# Patient Record
Sex: Female | Born: 1950 | Race: White | Hispanic: No | Marital: Married | Smoking: Never smoker
Health system: Southern US, Community
[De-identification: ages and names within clinical notes are randomized; demographics above are authoritative.]

## PROBLEM LIST (undated history)

## (undated) DIAGNOSIS — I10 Essential (primary) hypertension: Secondary | ICD-10-CM

## (undated) DIAGNOSIS — C4490 Unspecified malignant neoplasm of skin, unspecified: Secondary | ICD-10-CM

## (undated) DIAGNOSIS — J189 Pneumonia, unspecified organism: Secondary | ICD-10-CM

## (undated) DIAGNOSIS — I2510 Atherosclerotic heart disease of native coronary artery without angina pectoris: Secondary | ICD-10-CM

## (undated) DIAGNOSIS — S72009A Fracture of unspecified part of neck of unspecified femur, initial encounter for closed fracture: Secondary | ICD-10-CM

## (undated) DIAGNOSIS — B009 Herpesviral infection, unspecified: Secondary | ICD-10-CM

## (undated) DIAGNOSIS — M8580 Other specified disorders of bone density and structure, unspecified site: Secondary | ICD-10-CM

## (undated) HISTORY — PX: NASAL SEPTUM SURGERY: SHX37

## (undated) HISTORY — PX: CALDWELL LUC: SHX1284

## (undated) HISTORY — DX: Other specified disorders of bone density and structure, unspecified site: M85.80

## (undated) HISTORY — PX: TONSILLECTOMY: SUR1361

## (undated) HISTORY — PX: HIP SURGERY: SHX245

## (undated) HISTORY — DX: Herpesviral infection, unspecified: B00.9

## (undated) HISTORY — DX: Atherosclerotic heart disease of native coronary artery without angina pectoris: I25.10

## (undated) HISTORY — PX: MOHS SURGERY: SUR867

## (undated) HISTORY — DX: Unspecified malignant neoplasm of skin, unspecified: C44.90

---

## 1998-05-02 ENCOUNTER — Ambulatory Visit (HOSPITAL_COMMUNITY): Admission: RE | Admit: 1998-05-02 | Discharge: 1998-05-02 | Payer: Self-pay | Admitting: Obstetrics and Gynecology

## 1998-05-04 ENCOUNTER — Ambulatory Visit (HOSPITAL_COMMUNITY): Admission: RE | Admit: 1998-05-04 | Discharge: 1998-05-04 | Payer: Self-pay | Admitting: Obstetrics and Gynecology

## 1999-07-16 ENCOUNTER — Ambulatory Visit (HOSPITAL_COMMUNITY): Admission: RE | Admit: 1999-07-16 | Discharge: 1999-07-16 | Payer: Self-pay | Admitting: Obstetrics and Gynecology

## 1999-07-16 ENCOUNTER — Encounter: Payer: Self-pay | Admitting: Obstetrics and Gynecology

## 2000-05-04 ENCOUNTER — Other Ambulatory Visit: Admission: RE | Admit: 2000-05-04 | Discharge: 2000-05-04 | Payer: Self-pay | Admitting: Obstetrics and Gynecology

## 2000-07-31 ENCOUNTER — Ambulatory Visit (HOSPITAL_COMMUNITY): Admission: RE | Admit: 2000-07-31 | Discharge: 2000-07-31 | Payer: Self-pay | Admitting: Obstetrics and Gynecology

## 2000-07-31 ENCOUNTER — Encounter: Payer: Self-pay | Admitting: Obstetrics and Gynecology

## 2001-06-21 ENCOUNTER — Other Ambulatory Visit: Admission: RE | Admit: 2001-06-21 | Discharge: 2001-06-21 | Payer: Self-pay | Admitting: Obstetrics and Gynecology

## 2001-08-05 ENCOUNTER — Encounter: Payer: Self-pay | Admitting: Internal Medicine

## 2001-08-05 ENCOUNTER — Encounter: Admission: RE | Admit: 2001-08-05 | Discharge: 2001-08-05 | Payer: Self-pay | Admitting: Internal Medicine

## 2001-08-17 ENCOUNTER — Ambulatory Visit (HOSPITAL_COMMUNITY): Admission: RE | Admit: 2001-08-17 | Discharge: 2001-08-17 | Payer: Self-pay | Admitting: Obstetrics and Gynecology

## 2001-08-17 ENCOUNTER — Encounter: Payer: Self-pay | Admitting: Obstetrics and Gynecology

## 2002-02-09 ENCOUNTER — Encounter: Admission: RE | Admit: 2002-02-09 | Discharge: 2002-02-09 | Payer: Self-pay | Admitting: Family Medicine

## 2002-02-10 ENCOUNTER — Encounter: Admission: RE | Admit: 2002-02-10 | Discharge: 2002-02-10 | Payer: Self-pay | Admitting: Sports Medicine

## 2002-07-25 ENCOUNTER — Other Ambulatory Visit: Admission: RE | Admit: 2002-07-25 | Discharge: 2002-07-25 | Payer: Self-pay | Admitting: Obstetrics and Gynecology

## 2002-08-11 ENCOUNTER — Encounter: Admission: RE | Admit: 2002-08-11 | Discharge: 2002-08-11 | Payer: Self-pay | Admitting: Obstetrics and Gynecology

## 2002-08-11 ENCOUNTER — Encounter: Payer: Self-pay | Admitting: Obstetrics and Gynecology

## 2002-11-10 ENCOUNTER — Ambulatory Visit (HOSPITAL_COMMUNITY): Admission: RE | Admit: 2002-11-10 | Discharge: 2002-11-10 | Payer: Self-pay | Admitting: Obstetrics and Gynecology

## 2002-11-10 ENCOUNTER — Encounter: Payer: Self-pay | Admitting: Obstetrics and Gynecology

## 2003-08-30 ENCOUNTER — Other Ambulatory Visit: Admission: RE | Admit: 2003-08-30 | Discharge: 2003-08-30 | Payer: Self-pay | Admitting: Obstetrics and Gynecology

## 2003-09-20 ENCOUNTER — Encounter: Payer: Self-pay | Admitting: Internal Medicine

## 2003-09-20 ENCOUNTER — Encounter: Admission: RE | Admit: 2003-09-20 | Discharge: 2003-09-20 | Payer: Self-pay | Admitting: Internal Medicine

## 2003-11-13 ENCOUNTER — Encounter: Admission: RE | Admit: 2003-11-13 | Discharge: 2003-11-13 | Payer: Self-pay | Admitting: Obstetrics and Gynecology

## 2003-12-28 ENCOUNTER — Encounter: Admission: RE | Admit: 2003-12-28 | Discharge: 2004-01-29 | Payer: Self-pay | Admitting: Sports Medicine

## 2004-10-18 ENCOUNTER — Ambulatory Visit (HOSPITAL_COMMUNITY): Admission: RE | Admit: 2004-10-18 | Discharge: 2004-10-18 | Payer: Self-pay | Admitting: Gastroenterology

## 2004-11-29 ENCOUNTER — Encounter: Admission: RE | Admit: 2004-11-29 | Discharge: 2004-11-29 | Payer: Self-pay | Admitting: Obstetrics and Gynecology

## 2005-08-27 ENCOUNTER — Other Ambulatory Visit: Admission: RE | Admit: 2005-08-27 | Discharge: 2005-08-27 | Payer: Self-pay | Admitting: Obstetrics and Gynecology

## 2005-09-16 ENCOUNTER — Encounter: Admission: RE | Admit: 2005-09-16 | Discharge: 2005-09-16 | Payer: Self-pay | Admitting: Obstetrics and Gynecology

## 2006-01-01 ENCOUNTER — Encounter: Admission: RE | Admit: 2006-01-01 | Discharge: 2006-01-01 | Payer: Self-pay | Admitting: Obstetrics and Gynecology

## 2006-10-13 ENCOUNTER — Other Ambulatory Visit: Admission: RE | Admit: 2006-10-13 | Discharge: 2006-10-13 | Payer: Self-pay | Admitting: Obstetrics and Gynecology

## 2007-01-04 ENCOUNTER — Encounter: Admission: RE | Admit: 2007-01-04 | Discharge: 2007-01-04 | Payer: Self-pay | Admitting: Obstetrics and Gynecology

## 2007-01-14 ENCOUNTER — Ambulatory Visit: Payer: Self-pay | Admitting: Sports Medicine

## 2007-03-05 ENCOUNTER — Encounter: Payer: Self-pay | Admitting: Family Medicine

## 2007-03-05 LAB — CONVERTED CEMR LAB: Vit D, 1,25-Dihydroxy: 28 (ref 20–57)

## 2007-07-13 ENCOUNTER — Encounter: Payer: Self-pay | Admitting: Family Medicine

## 2007-07-13 LAB — CONVERTED CEMR LAB
ALT: 15 U/L
AST: 20 U/L
Albumin: 4.5 g/dL
Alkaline Phosphatase: 73 U/L
Bilirubin, Direct: 0.2 mg/dL
Indirect Bilirubin: 0.4 mg/dL
Total Bilirubin: 0.6 mg/dL
Total Protein: 6.8 g/dL
Vit D, 1,25-Dihydroxy: 61 — ABNORMAL HIGH

## 2007-09-15 ENCOUNTER — Encounter: Payer: Self-pay | Admitting: Family Medicine

## 2007-09-15 LAB — CONVERTED CEMR LAB
ALT: 15 units/L (ref 0–35)
AST: 20 units/L (ref 0–37)
Albumin: 4.5 g/dL (ref 3.5–5.2)
Alkaline Phosphatase: 68 units/L (ref 39–117)
BUN: 11 mg/dL (ref 6–23)
Basophils Absolute: 0 10*3/uL (ref 0.0–0.1)
Basophils Relative: 0 % (ref 0–1)
CO2: 27 meq/L (ref 19–32)
Calcium: 9.8 mg/dL (ref 8.4–10.5)
Chloride: 103 meq/L (ref 96–112)
Cholesterol: 195 mg/dL (ref 0–200)
Creatinine, Ser: 0.88 mg/dL (ref 0.40–1.20)
Eosinophils Absolute: 0.3 10*3/uL (ref 0.0–0.7)
Eosinophils Relative: 6 % — ABNORMAL HIGH (ref 0–5)
Glucose, Bld: 85 mg/dL (ref 70–99)
HCT: 45.4 % (ref 36.0–46.0)
HDL: 68 mg/dL (ref >39)
Hemoglobin: 14.8 g/dL (ref 12.0–15.0)
LDL Cholesterol: 108 mg/dL — ABNORMAL HIGH (ref 0–99)
Lymphocytes Relative: 28 % (ref 12–46)
Lymphs Abs: 1.4 10*3/uL (ref 0.7–3.3)
MCHC: 32.6 g/dL (ref 30.0–36.0)
MCV: 86.3 fL (ref 78.0–100.0)
Monocytes Absolute: 0.4 10*3/uL (ref 0.2–0.7)
Monocytes Relative: 9 % (ref 3–11)
Neutro Abs: 2.8 10*3/uL (ref 1.7–7.7)
Neutrophils Relative %: 58 % (ref 43–77)
Platelets: 187 10*3/uL (ref 150–400)
Potassium: 4.2 meq/L (ref 3.5–5.3)
RBC: 5.26 M/uL — ABNORMAL HIGH (ref 3.87–5.11)
RDW: 13.4 % (ref 11.5–14.0)
Sodium: 141 meq/L (ref 135–145)
TSH: 2.474 microintl units/mL (ref 0.350–5.50)
Total Bilirubin: 0.6 mg/dL (ref 0.3–1.2)
Total CHOL/HDL Ratio: 2.9
Total Protein: 7 g/dL (ref 6.0–8.3)
Triglycerides: 95 mg/dL (ref <150)
VLDL: 19 mg/dL (ref 0–40)
WBC: 4.9 10*3/uL (ref 4.0–10.5)

## 2007-10-27 ENCOUNTER — Encounter: Payer: Self-pay | Admitting: Family Medicine

## 2007-11-08 ENCOUNTER — Encounter: Payer: Self-pay | Admitting: Family Medicine

## 2008-01-18 ENCOUNTER — Encounter: Admission: RE | Admit: 2008-01-18 | Discharge: 2008-01-18 | Payer: Self-pay | Admitting: Obstetrics and Gynecology

## 2008-02-22 ENCOUNTER — Encounter (INDEPENDENT_AMBULATORY_CARE_PROVIDER_SITE_OTHER): Payer: Self-pay | Admitting: Family Medicine

## 2008-05-19 ENCOUNTER — Encounter (INDEPENDENT_AMBULATORY_CARE_PROVIDER_SITE_OTHER): Payer: Self-pay | Admitting: Family Medicine

## 2008-05-19 LAB — CONVERTED CEMR LAB
ALT: 16 units/L (ref 0–35)
AST: 20 units/L (ref 0–37)
Albumin: 4.3 g/dL (ref 3.5–5.2)
Alkaline Phosphatase: 65 units/L (ref 39–117)
BUN: 19 mg/dL (ref 6–23)
CO2: 27 meq/L (ref 19–32)
Calcium: 9.6 mg/dL (ref 8.4–10.5)
Chloride: 103 meq/L (ref 96–112)
Cholesterol: 193 mg/dL (ref 0–200)
Creatinine, Ser: 0.76 mg/dL (ref 0.40–1.20)
Glucose, Bld: 90 mg/dL (ref 70–99)
HDL: 70 mg/dL (ref >39)
LDL Cholesterol: 104 mg/dL — ABNORMAL HIGH (ref 0–99)
Potassium: 4.2 meq/L (ref 3.5–5.3)
Sodium: 141 meq/L (ref 135–145)
Total Bilirubin: 0.7 mg/dL (ref 0.3–1.2)
Total CHOL/HDL Ratio: 2.8
Total Protein: 6.8 g/dL (ref 6.0–8.3)
Triglycerides: 94 mg/dL (ref <150)
VLDL: 19 mg/dL (ref 0–40)

## 2008-05-22 ENCOUNTER — Encounter: Payer: Self-pay | Admitting: Family Medicine

## 2008-05-26 ENCOUNTER — Ambulatory Visit (HOSPITAL_COMMUNITY): Admission: RE | Admit: 2008-05-26 | Discharge: 2008-05-26 | Payer: Self-pay | Admitting: Obstetrics and Gynecology

## 2008-07-05 ENCOUNTER — Encounter: Payer: Self-pay | Admitting: Family Medicine

## 2008-07-05 DIAGNOSIS — M810 Age-related osteoporosis without current pathological fracture: Secondary | ICD-10-CM

## 2008-09-28 ENCOUNTER — Ambulatory Visit: Payer: Self-pay | Admitting: Sports Medicine

## 2008-09-28 DIAGNOSIS — M25539 Pain in unspecified wrist: Secondary | ICD-10-CM

## 2008-10-13 ENCOUNTER — Encounter: Payer: Self-pay | Admitting: Family Medicine

## 2008-10-17 ENCOUNTER — Ambulatory Visit (HOSPITAL_COMMUNITY): Admission: RE | Admit: 2008-10-17 | Discharge: 2008-10-17 | Payer: Self-pay | Admitting: Orthopedic Surgery

## 2008-10-25 ENCOUNTER — Encounter: Payer: Self-pay | Admitting: Family Medicine

## 2008-10-25 ENCOUNTER — Encounter (INDEPENDENT_AMBULATORY_CARE_PROVIDER_SITE_OTHER): Payer: Self-pay | Admitting: Family Medicine

## 2008-12-04 ENCOUNTER — Encounter: Payer: Self-pay | Admitting: Family Medicine

## 2008-12-04 LAB — CONVERTED CEMR LAB: Vit D, 1,25-Dihydroxy: 71 (ref 30–89)

## 2008-12-06 ENCOUNTER — Encounter: Payer: Self-pay | Admitting: Family Medicine

## 2009-02-05 ENCOUNTER — Encounter: Payer: Self-pay | Admitting: Family Medicine

## 2009-02-06 ENCOUNTER — Encounter: Admission: RE | Admit: 2009-02-06 | Discharge: 2009-02-06 | Payer: Self-pay | Admitting: Obstetrics and Gynecology

## 2009-03-28 ENCOUNTER — Encounter: Payer: Self-pay | Admitting: Family Medicine

## 2009-03-28 ENCOUNTER — Ambulatory Visit (HOSPITAL_COMMUNITY): Admission: RE | Admit: 2009-03-28 | Discharge: 2009-03-28 | Payer: Self-pay | Admitting: Family Medicine

## 2009-04-18 ENCOUNTER — Encounter: Payer: Self-pay | Admitting: Family Medicine

## 2009-05-08 ENCOUNTER — Encounter: Payer: Self-pay | Admitting: Family Medicine

## 2009-07-16 DIAGNOSIS — M549 Dorsalgia, unspecified: Secondary | ICD-10-CM | POA: Insufficient documentation

## 2009-07-19 ENCOUNTER — Emergency Department (HOSPITAL_COMMUNITY): Admission: EM | Admit: 2009-07-19 | Discharge: 2009-07-19 | Payer: Self-pay | Admitting: Emergency Medicine

## 2010-02-07 ENCOUNTER — Encounter: Admission: RE | Admit: 2010-02-07 | Discharge: 2010-02-07 | Payer: Self-pay | Admitting: Obstetrics and Gynecology

## 2010-02-20 ENCOUNTER — Ambulatory Visit: Payer: Self-pay | Admitting: Family Medicine

## 2010-02-20 ENCOUNTER — Telehealth (INDEPENDENT_AMBULATORY_CARE_PROVIDER_SITE_OTHER): Payer: Self-pay | Admitting: Family Medicine

## 2010-02-20 DIAGNOSIS — J329 Chronic sinusitis, unspecified: Secondary | ICD-10-CM | POA: Insufficient documentation

## 2010-02-21 ENCOUNTER — Ambulatory Visit (HOSPITAL_COMMUNITY): Admission: RE | Admit: 2010-02-21 | Discharge: 2010-02-21 | Payer: Self-pay | Admitting: Family Medicine

## 2010-02-25 ENCOUNTER — Encounter: Payer: Self-pay | Admitting: Family Medicine

## 2010-05-07 ENCOUNTER — Encounter: Payer: Self-pay | Admitting: Family Medicine

## 2010-06-25 ENCOUNTER — Encounter: Payer: Self-pay | Admitting: Family Medicine

## 2010-06-25 LAB — CONVERTED CEMR LAB: TSH: 2.553 microintl units/mL (ref 0.350–4.500)

## 2010-06-26 ENCOUNTER — Ambulatory Visit: Payer: Self-pay | Admitting: Family Medicine

## 2010-11-21 ENCOUNTER — Ambulatory Visit (HOSPITAL_COMMUNITY)
Admission: RE | Admit: 2010-11-21 | Discharge: 2010-11-21 | Payer: Self-pay | Source: Home / Self Care | Attending: Obstetrics and Gynecology | Admitting: Obstetrics and Gynecology

## 2010-12-21 ENCOUNTER — Encounter: Payer: Self-pay | Admitting: Sports Medicine

## 2010-12-22 ENCOUNTER — Encounter: Payer: Self-pay | Admitting: Obstetrics and Gynecology

## 2010-12-22 ENCOUNTER — Encounter: Payer: Self-pay | Admitting: Orthopedic Surgery

## 2010-12-31 NOTE — Miscellaneous (Signed)
Summary: Zostovax  Zostovax   Imported By: Clydell Hakim 06/27/2010 16:22:08  _____________________________________________________________________  External Attachment:    Type:   Image     Comment:   External Document

## 2010-12-31 NOTE — Miscellaneous (Signed)
Clinical Lists Changes Two days ago was pushing 30 pound grandsons in sweing---today awoke w recurrence of acute radicular LBP. We reviewed MRI, last treatment and will redo same tx that worked last time. Medications: Added new medication of DIFLUCAN 100 MG TABS (FLUCONAZOLE) 1 by mouth every other day as directed for thrush - Signed Rx of PREDNISONE 20 MG TABS (PREDNISONE) 2 tabs by mouth once daily for 5 days;  #10 x 1;  Signed;  Entered by: Denny Levy MD;  Authorized by: Denny Levy MD;  Method used: Electronically to [facility]*, [address], [address] Shipping/mailing, [place], [place]  [address], Ph: [phone], Fax: [phone] Rx of DIFLUCAN 100 MG TABS (FLUCONAZOLE) 1 by mouth every other day as directed for thrush;  #3 x 1;  Signed;  Entered by: Denny Levy MD;  Authorized by: Denny Levy MD;  Method used: Electronically to [facility]*, [address] Shipping/mailing, [place], [place]  [address], Ph: [phone], Fax: [phone] Orders: Added new Test order of Provider Misc Charge- [facility] (Misc) - Signed    Prescriptions: DIFLUCAN 100 MG TABS (FLUCONAZOLE) 1 by mouth every other day as directed for thrush  #3 x 1   Entered and Authorized by:   Denny Levy MD   Signed by:   Denny Levy MD on 05/07/2010   Method used:   Electronically to        [facility]* (retail)       [address]       [address] Shipping/mailing       [place], [place]  [address]       Ph: [phone]       Fax: [phone]   RxID:   [id number] PREDNISONE 20 MG TABS (PREDNISONE) 2 tabs by mouth once daily for 5 days  #10 x 1   Entered and Authorized by:   Denny Levy MD   Signed by:   Denny Levy MD on 05/07/2010   Method used:   Electronically to        [facility]* (retail)       [address]       [address] Shipping/mailing       [place], [place]  [address]       Ph: [phone]       Fax: [phone]  RxID:   [id number]    Complete Medication List: 1)  Folic Acid 1 Mg Tabs (Folic acid) .... Take 1 tablet by mouth once a day 2)  Fosamax 70 Mg Tabs (Alendronate sodium) .... Take 1 tablet by mouth once a week 3)  Vitamin D 71696 Unit Caps (Ergocalciferol) .... Take 1 capsule by mouth once a week 4)  Neurontin 800 Mg Tabs (Gabapentin) .... One tab two times a day / three times a day 5)  Nexium 40 Mg Cpdr (Esomeprazole magnesium) .Marland Kitchen.. 1 tab by mouth qd 6)  Flonase 50 Mcg/act Susp (Fluticasone propionate) .... One spray each nostril daily:  disp qs for three month supply 7)  Amoxicillin-pot Clavulanate 875-125 Mg Tabs (Amoxicillin-pot clavulanate) .Marland Kitchen.. 1 tab by mouth two times a day for 10 days 8)  Naproxen 500 Mg Tabs (Naproxen) .Marland Kitchen.. 1 by mouth two times a day for 14 days then as needed. 9)  Prednisone 20 Mg Tabs (Prednisone) .... 2 tabs by mouth once daily for 5 days 10)  Vicodin 5-500 Mg Tabs (Hydrocodone-acetaminophen) .Marland Kitchen.. 1-2  by mouth q 4-6 as needed back pain 11)  Flexeril 10 Mg Tabs (Cyclobenzaprine hcl) .Marland Kitchen.. 1 by mouth three times a day as needed back spasm 12)  Diflucan 100 Mg Tabs (Fluconazole) .Marland Kitchen.. 1 by mouth every other day as directed for thrush

## 2010-12-31 NOTE — Progress Notes (Signed)
  Phone Note Outgoing Call   Summary of Call: DEAR Somaya Grassi TEAM can u call this in please Thanks!  Denny Levy MD  February 20, 2010 10:55 AM   Follow-up for Phone Call        Called to Pharm  Follow-up by: Gladstone Pih,  February 20, 2010 11:00 AM    Prescriptions: VICODIN 5-500 MG TABS (HYDROCODONE-ACETAMINOPHEN) 1-2 by mouth q 4-6 as needed back pain  #40 x 1   Entered and Authorized by:   Denny Levy MD   Signed by:   Denny Levy MD on 02/20/2010   Method used:   Telephoned to ...       [facility]* (retail)       [address]       [address] Shipping/mailing       [place], [place]  [address]       Ph: [phone]       Fax: [phone]   RxID:   [id number]

## 2010-12-31 NOTE — Assessment & Plan Note (Signed)
Summary: Zostavax/ls  Nurse Visit   Vital Signs:  Patient profile:   61 year old female Temp:     98.8 degrees F oral  Vitals Entered By: Theresia Lo RN (June 26, 2010 11:05 AM)  Allergies: 1)  Neosporin 2)  Cefaclor (Cefaclor)  Immunizations Administered:  Zostavax # 1:    Vaccine Type: Zostavax    Site: right arm    Mfr: Merck    Dose: 0.5 ml    Route: IM    Given by: Theresia Lo RN    Exp. Date: 12/15/2010    Lot #: [id number]    VIS given: 09/12/05 given June 26, 2010.  Orders Added: 1)  Zoster (Shingles) Vaccine Live [90736] 2)  Admin 1st Vaccine [90471]   Orders Added: 1)  Zoster (Shingles) Vaccine Live [90736] 2)  Admin 1st Vaccine [11914]

## 2010-12-31 NOTE — Miscellaneous (Signed)
  Clinical Lists Changes Reviewed MRI. She is taking neurontin for migraines--has been up to 2400 mg in past but decreased as migraines would allow it. She is still having radiating pain--sometimes down left and soemtimes down right. We will gradually increase her to two times a day and thenif needed three times a day neurontin. Discussed ergonomics of work station. Medications: Changed medication from NEURONTIN 800 MG  TABS (GABAPENTIN) One tab TID to NEURONTIN 800 MG  TABS (GABAPENTIN) One tab two times a day / three times a day - Signed Rx of NEURONTIN 800 MG  TABS (GABAPENTIN) One tab two times a day / three times a day;  #270 x 3;  Signed;  Entered by: Denny Levy MD;  Authorized by: Denny Levy MD;  Method used: Electronically to [facility]*, [address] Shipping/mailing, [place], [place]  [address], Ph: [phone], Fax: [phone]    Prescriptions: NEURONTIN 800 MG  TABS (GABAPENTIN) One tab two times a day / three times a day  #270 x 3   Entered and Authorized by:   Denny Levy MD   Signed by:   Denny Levy MD on 02/25/2010   Method used:   Electronically to        [facility]* (retail)       [address]       [address] Shipping/mailing       [place], [place]  [address]       Ph: [phone]       Fax: [phone]   RxID:   [id number]

## 2010-12-31 NOTE — Assessment & Plan Note (Signed)
Summary: Tracey [name]   History of Present Illness: BACK PAIN: started end of feb after a lot of standing. has intermittently been bothering her since. Today it is the worst it has been. Unable to sit for more than a minute because  of pain. Even pain with standing which usually makes it some better.Pain radiates from right mid buttock to right lateral thigh, stopping just above knee. No incontinence. Pain is sharp, burning and 10/10 right now.  has had similar back issues in the past but never this bad or for this long.   Pertinent PMH / PSH. No hx back surgeries or interventions. Positive for osteopenia  also c/o worseninng sinus signs--has hx of chronic sinusitis. recently tx with 10 days of augmentin and still having facial pain, blowing green nasal d/c, headache frontal. feels like all of her other times she has had sinusitis. would like second round of abx.  Current Medications (verified): 1)  Folic Acid 1 Mg Tabs (Folic Acid) .... Take 1 Tablet By Mouth Once A Day 2)  Fosamax 70 Mg Tabs (Alendronate Sodium) .... Take 1 Tablet By Mouth Once A Week 3)  Vitamin D 29562 Unit Caps (Ergocalciferol) .... Take 1 Capsule By Mouth Once A Week 4)  Neurontin 800 Mg  Tabs (Gabapentin) .... One Tab Tid 5)  Nexium 40 Mg Cpdr (Esomeprazole Magnesium) .Marland Kitchen.. 1 Tab By Mouth Qd 6)  Flonase 50 Mcg/act  Susp (Fluticasone Propionate) .... One Spray Each Nostril Daily:  Disp Qs For Three Month Supply 7)  Amoxicillin-Pot Clavulanate 875-125 Mg Tabs (Amoxicillin-Pot Clavulanate) .Marland Kitchen.. 1 Tab By Mouth Two Times A Day For 10 Days 8)  Naproxen 500 Mg Tabs (Naproxen) .Marland Kitchen.. 1 By Mouth Two Times A Day For 14 Days Then As Needed. 9)  Prednisone 20 Mg Tabs (Prednisone) .... 2 Tabs By Mouth Once Daily For 5 Days 10)  Vicodin 5-500 Mg Tabs (Hydrocodone-Acetaminophen) .Marland Kitchen.. 1-2 By Mouth Q 4-6 As Needed Back Pain 11)  Flexeril 10 Mg Tabs (Cyclobenzaprine Hcl) .Marland Kitchen.. 1 By Mouth Three Times A Day As Needed Back Spasm  Allergies: 1)   Neosporin 2)  Cefaclor (Cefaclor)  Review of Systems       see HPI  Physical Exam  General:  alert.  appears to be in pain Nose:  maxillary sinus tenderness to palpation Abdomen:  soft and non-tender.     Detailed Back/Spine Exam  Lumbosacral Exam:  Inspection-deformity:    Normal Palpation-spinal tenderness:  Normal Range of Motion:    Forward Flexion:   80 degrees    Hyperextension:   10 degrees    Right Lateral Bend:   35 degrees    Left Lateral Bend:   35 degrees Squatting:  abnormal    pain with squatting Lying Straight Leg Raise:    Right:  negative    Left:  negative Sitting Straight Leg Raise:    Right:  negative    Left:  negative Reverse Straight Leg Raise:    Right:  negative    Left:  negative Fabere Test:    Right:  positive    Left:  negative     LE strength in hip flexors and knee extensors /flexors iis normal and symmetrical   Impression & Recommendations:  Problem # 1:  BACK PAIN, ACUTE (ICD-724.5)  Orders: MRI without Contrast (MRI w/o Contrast) [facility]- Est  Level 4 (13086) Admin of Therapeutic Inj  intramuscular or subcutaneous (57846) Ketorolac-Toradol 15mg  (N6295) Ketorolac-Toradol 15mg  (M8413) Ketorolac-Toradol 15mg  (K4401) Ketorolac-Toradol 15mg  (U2725) some  radiculopathy with this in distribution. 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 toradol shot now. vicodin and flexeril, MRI f/u after MRI and in interim with new or sowrsening problems. We talked about possible SI joint injection as well, it might be diagnostic atleast in part, but she did not want to do that today  Problem # 2:  OTHER ACUTE SINUSITIS (ICD-461.8)  Her updated medication list for this problem includes:    Flonase 50 Mcg/act Susp (Fluticasone propionate) ..... One spray each nostril daily:  disp qs for three month supply    Amoxicillin-pot Clavulanate 875-125 Mg Tabs (Amoxicillin-pot clavulanate) .Marland Kitchen... 1 tab  by mouth two times a day for 10 days  Complete Medication List: 1)  Folic Acid 1 Mg Tabs (Folic acid) .... Take 1 tablet by mouth once a day 2)  Fosamax 70 Mg Tabs (Alendronate sodium) .... Take 1 tablet by mouth once a week 3)  Vitamin D [address] Unit Caps (Ergocalciferol) .... Take 1 capsule by mouth once a week 4)  Neurontin 800 Mg Tabs (Gabapentin) .... One tab tid 5)  Nexium 40 Mg Cpdr (Esomeprazole magnesium) .Marland Kitchen.. 1 tab by mouth qd 6)  Flonase 50 Mcg/act Susp (Fluticasone propionate) .... One spray each nostril daily:  disp qs for three month supply 7)  Amoxicillin-pot Clavulanate 875-125 Mg Tabs (Amoxicillin-pot clavulanate) .Marland Kitchen.. 1 tab by mouth two times a day for 10 days 8)  Naproxen 500 Mg Tabs (Naproxen) .Marland Kitchen.. 1 by mouth two times a day for 14 days then as needed. 9)  Prednisone 20 Mg Tabs (Prednisone) .... 2 tabs by mouth once daily for 5 days 10)  Vicodin 5-500 Mg Tabs (Hydrocodone-acetaminophen) .Marland Kitchen.. 1-2 by mouth q 4-6 as needed back pain 11)  Flexeril 10 Mg Tabs (Cyclobenzaprine hcl) .Marland Kitchen.. 1 by mouth three times a day as needed back spasm Prescriptions: FLEXERIL 10 MG TABS (CYCLOBENZAPRINE HCL) 1 by mouth three times a day as needed back spasm  #60 x 1   Entered and Authorized by:   Denny Levy MD   Signed by:   Denny Levy MD on 02/20/2010   Method used:   Electronically to        [facility]* (retail)       [address]       [address] Shipping/mailing       [place], [place]  [address]       Ph: [phone]       Fax: [phone]   RxID:   [id number] AMOXICILLIN-POT CLAVULANATE 875-125 MG TABS (AMOXICILLIN-POT CLAVULANATE) 1 tab by mouth two times a day for 10 days  #20 x 0   Entered and Authorized by:   Denny Levy MD   Signed by:   Denny Levy MD on 02/20/2010   Method used:   Electronically to        [facility]* (retail)       [address]       [address] Shipping/mailing       [place], [place]  [address]       Ph:  [phone]       Fax: [phone]   RxID:   [id number] PREDNISONE 20 MG TABS (PREDNISONE) 2 tabs by mouth once daily for 5 days  #10 x 0   Entered and Authorized by:   Denny Levy MD   Signed by:   Denny Levy MD on 02/20/2010  Method used:   Electronically to        [facility]* (retail)       [address]       [address] Shipping/mailing       [place], [place]  [address]       Ph: [phone]       Fax: [phone]   RxID:   [id number] NEXIUM 40 MG CPDR (ESOMEPRAZOLE MAGNESIUM) 1 tab by mouth qd  #30 x 3   Entered and Authorized by:   Denny Levy MD   Signed by:   Denny Levy MD on 02/20/2010   Method used:   Electronically to        [facility]* (retail)       [address]       [address] Shipping/mailing       [place], [place]  [address]       Ph: [phone]       Fax: [phone]   RxID:   [id number]    Medication Administration  Injection # 1:    Medication: Ketorolac-Toradol 15mg     Diagnosis: BACK PAIN, ACUTE (ICD-724.5)    Route: IM    Site: LUOQ gluteus    Exp Date: 08/2011    Lot #: [id number]    Mfr: Perrin Maltese    Comments: Admin 60mg  per Dr Jennette Kettle    Patient tolerated injection without complications    Given by: Gladstone Pih (February 20, 2010 1:34 PM)  Injection # 2:    Medication: Ketorolac-Toradol 15mg     Diagnosis: BACK PAIN, ACUTE (ICD-724.5)    Route: IM    Site: LUOQ gluteus    Exp Date: 08/2011    Lot #: [id number]    Mfr: Perrin Maltese    Comments: Admin 60mg  per dr Jennette Kettle    Patient tolerated injection without complications    Given by: Gladstone Pih (February 20, 2010 1:35 PM)  Injection # 3:    Medication: Ketorolac-Toradol 15mg     Diagnosis: BACK PAIN, ACUTE (ICD-724.5)    Route: IM    Site: LUOQ gluteus    Exp Date: 08/2011    Lot #: [id number]    Mfr: Perrin Maltese    Comments: Admin 60mg  per Dr Jennette Kettle    Patient tolerated injection without complications    Given by: Gladstone Pih (February 20, 2010 1:37  PM)  Injection # 4:    Medication: Ketorolac-Toradol 15mg     Diagnosis: BACK PAIN, ACUTE (ICD-724.5)    Route: IM    Site: LUOQ gluteus    Exp Date: 08/2011    Lot #: [id number]    Mfr: Perrin Maltese    Comments: Admin 60mg  per Dr Jennette Kettle    Patient tolerated injection without complications    Given by: Gladstone Pih (February 20, 2010 1:37 PM)  Orders Added: 1)  MRI without Contrast [MRI w/o Contrast] 2)  [facility]- Est  Level 4 [99214] 3)  Admin of Therapeutic Inj  intramuscular or subcutaneous [96372] 4)  Ketorolac-Toradol 15mg  [J1885] 5)  Ketorolac-Toradol 15mg  [J1885] 6)  Ketorolac-Toradol 15mg  [J1885] 7)  Ketorolac-Toradol 15mg  [J1885]

## 2011-01-07 ENCOUNTER — Encounter: Payer: Self-pay | Admitting: Family Medicine

## 2011-01-16 NOTE — Miscellaneous (Signed)
  Clinical Lists Changes  Medications: Added new medication of TRANXENE-T 7.5 MG TABS (CLORAZEPATE DIPOTASSIUM) 1 by mouth two times a day / once daily prn - Signed Rx of TRANXENE-T 7.5 MG TABS (CLORAZEPATE DIPOTASSIUM) 1 by mouth two times a day / once daily prn;  #60 x 1;  Signed;  Entered by: Denny Levy MD;  Authorized by: Denny Levy MD;  Method used: Handwritten    Prescriptions: TRANXENE-T 7.5 MG TABS (CLORAZEPATE DIPOTASSIUM) 1 by mouth two times a day / once daily prn  #60 x 1   Entered and Authorized by:   Denny Levy MD   Signed by:   Denny Levy MD on 01/07/2011   Method used:   Handwritten   RxID:   [id number]

## 2011-01-31 ENCOUNTER — Ambulatory Visit: Payer: Commercial Managed Care - PPO | Attending: Obstetrics and Gynecology | Admitting: Physical Therapy

## 2011-01-31 DIAGNOSIS — IMO0001 Reserved for inherently not codable concepts without codable children: Secondary | ICD-10-CM | POA: Insufficient documentation

## 2011-01-31 DIAGNOSIS — M6281 Muscle weakness (generalized): Secondary | ICD-10-CM | POA: Insufficient documentation

## 2011-01-31 DIAGNOSIS — M2560 Stiffness of unspecified joint, not elsewhere classified: Secondary | ICD-10-CM | POA: Insufficient documentation

## 2011-01-31 DIAGNOSIS — R5381 Other malaise: Secondary | ICD-10-CM | POA: Insufficient documentation

## 2011-01-31 DIAGNOSIS — M2550 Pain in unspecified joint: Secondary | ICD-10-CM | POA: Insufficient documentation

## 2011-02-07 ENCOUNTER — Encounter: Payer: Self-pay | Admitting: Family Medicine

## 2011-02-07 DIAGNOSIS — K219 Gastro-esophageal reflux disease without esophagitis: Secondary | ICD-10-CM | POA: Insufficient documentation

## 2011-02-07 MED ORDER — RANITIDINE HCL 150 MG PO TABS: 150.0000 mg | ORAL_TABLET | Freq: Two times a day (BID) | ORAL | Status: DC

## 2011-02-07 NOTE — Progress Notes (Signed)
  Subjective:    Patient ID: Tracey [name], female    DOB: 08-10-1951, 60 y.o.   MRN: [id number]  HPIStarted fosamax again and now with worsening GERD symptoms.  Will start ranitadine and leave to Dr. Pennie Rushing decision to continue or switch fosamax    Review of Systems     Objective:   Physical Exam        Assessment & Plan:

## 2011-02-10 ENCOUNTER — Telehealth: Payer: Self-pay | Admitting: Family Medicine

## 2011-02-10 NOTE — Telephone Encounter (Signed)
BP now 190/107.  Worried and not sure what to do.  No new symptoms, still some nausea.  Gave red flags, advised to come in if anything changed (including worsening nausea) and we would see her in ED.

## 2011-02-10 NOTE — Telephone Encounter (Signed)
Received outside call stating that her BP was 170s/100's as measured at the [facility] tonight.  140's systolic earlier in day.  Has had some mild nausea that kept her out of work today.  Denies numbness, weakness, chest pain, headache, vision changes, palpitations.  Explained to her that she could recheck in a little bit, and likely ok until the am as long as asymptomatic.  Told her if she began to have chest pain, severe headache, palpitations, numbness/weakness to go to ED.  She acknowledged and stated she would recheck BP in a bit.

## 2011-02-11 ENCOUNTER — Ambulatory Visit (INDEPENDENT_AMBULATORY_CARE_PROVIDER_SITE_OTHER): Payer: Commercial Managed Care - PPO | Admitting: Family Medicine

## 2011-02-11 ENCOUNTER — Encounter: Payer: Commercial Managed Care - PPO | Admitting: Physical Therapy

## 2011-02-11 DIAGNOSIS — F411 Generalized anxiety disorder: Secondary | ICD-10-CM

## 2011-02-11 DIAGNOSIS — K589 Irritable bowel syndrome without diarrhea: Secondary | ICD-10-CM

## 2011-02-11 DIAGNOSIS — K219 Gastro-esophageal reflux disease without esophagitis: Secondary | ICD-10-CM

## 2011-02-11 MED ORDER — PROMETHAZINE HCL 12.5 MG PO TABS: 12.5000 mg | ORAL_TABLET | Freq: Four times a day (QID) | ORAL | Status: AC | PRN

## 2011-02-11 MED ORDER — PAROXETINE HCL 20 MG PO TABS: 20.0000 mg | ORAL_TABLET | Freq: Every day | ORAL | Status: DC

## 2011-02-11 NOTE — Assessment & Plan Note (Addendum)
Feel symptom complex most likely IBS Less likely GERD with esophagitis 2nd to fosamax Viral gastroenteritis Cholecystitis.  Will Rx symptomatically with phenergan which should also help sleep issues.

## 2011-02-11 NOTE — Progress Notes (Signed)
  Subjective:    Patient ID: Tracey [name], female    DOB: 1951-06-22, 60 y.o.   MRN: [id number]  HPI Nausea x 3 days.  Began after taking fosamax.  Now also with loose stools plus hot and cold feeling. No documented fever.  No vomiting, just nausea. Decreased PO intake. Has hx of IBS, these symptoms are typical.  Has not had flair for 9 months. Stress level high and anxiety may be contributing.  Work stress high. States has slept very little in the past three days.    Review of Systems Denies urgency, frequency and dysuria     Objective:   Physical Exam Abd benign except for epigastric tenderness.       Assessment & Plan:

## 2011-02-11 NOTE — Assessment & Plan Note (Signed)
Continue ranitidine.  Avoid long term PPIs because of osteoporosis

## 2011-02-11 NOTE — Assessment & Plan Note (Signed)
Feel she meets the criteria and this is playing role in her coping with stress.  Wait until current flair of nausea resolved and begin scheduled SSRI

## 2011-02-18 ENCOUNTER — Ambulatory Visit: Payer: Commercial Managed Care - PPO | Admitting: Physical Therapy

## 2011-02-19 ENCOUNTER — Other Ambulatory Visit: Payer: Self-pay | Admitting: Family Medicine

## 2011-02-20 LAB — COMPREHENSIVE METABOLIC PANEL
Alkaline Phosphatase: 63 U/L (ref 39–117)
BUN: 14 mg/dL (ref 6–23)
CO2: 28 mEq/L (ref 19–32)
Creat: 0.93 mg/dL (ref 0.40–1.20)
Glucose, Bld: 102 mg/dL — ABNORMAL HIGH (ref 70–99)
Sodium: 138 mEq/L (ref 135–145)
Total Bilirubin: 0.4 mg/dL (ref 0.3–1.2)

## 2011-02-25 ENCOUNTER — Ambulatory Visit: Payer: Commercial Managed Care - PPO | Admitting: Physical Therapy

## 2011-03-04 ENCOUNTER — Ambulatory Visit: Payer: Commercial Managed Care - PPO | Attending: Obstetrics and Gynecology | Admitting: Physical Therapy

## 2011-03-04 ENCOUNTER — Other Ambulatory Visit: Payer: Self-pay | Admitting: Obstetrics and Gynecology

## 2011-03-04 DIAGNOSIS — R5381 Other malaise: Secondary | ICD-10-CM | POA: Insufficient documentation

## 2011-03-04 DIAGNOSIS — M2550 Pain in unspecified joint: Secondary | ICD-10-CM | POA: Insufficient documentation

## 2011-03-04 DIAGNOSIS — Z1231 Encounter for screening mammogram for malignant neoplasm of breast: Secondary | ICD-10-CM

## 2011-03-04 DIAGNOSIS — IMO0001 Reserved for inherently not codable concepts without codable children: Secondary | ICD-10-CM | POA: Insufficient documentation

## 2011-03-04 DIAGNOSIS — M2560 Stiffness of unspecified joint, not elsewhere classified: Secondary | ICD-10-CM | POA: Insufficient documentation

## 2011-03-04 DIAGNOSIS — M6281 Muscle weakness (generalized): Secondary | ICD-10-CM | POA: Insufficient documentation

## 2011-03-10 ENCOUNTER — Ambulatory Visit
Admission: RE | Admit: 2011-03-10 | Discharge: 2011-03-10 | Disposition: A | Discharge status: Home / Self Care | Payer: Commercial Managed Care - PPO | Source: Ambulatory Visit | Attending: Obstetrics and Gynecology | Admitting: Obstetrics and Gynecology

## 2011-03-10 ENCOUNTER — Inpatient Hospital Stay: Admission: RE | Admit: 2011-03-10 | Payer: Commercial Managed Care - PPO | Source: Ambulatory Visit

## 2011-03-10 DIAGNOSIS — Z1231 Encounter for screening mammogram for malignant neoplasm of breast: Secondary | ICD-10-CM

## 2011-03-11 ENCOUNTER — Ambulatory Visit: Payer: Commercial Managed Care - PPO | Admitting: Physical Therapy

## 2011-03-12 ENCOUNTER — Ambulatory Visit (HOSPITAL_COMMUNITY): Payer: Commercial Managed Care - PPO | Attending: Obstetrics and Gynecology

## 2011-03-12 ENCOUNTER — Other Ambulatory Visit: Payer: Self-pay | Admitting: Family Medicine

## 2011-03-13 LAB — PHOSPHORUS: Phosphorus: 3 mg/dL (ref 2.3–4.6)

## 2011-03-13 LAB — MAGNESIUM: Magnesium: 2.3 mg/dL (ref 1.5–2.5)

## 2011-03-17 ENCOUNTER — Encounter (HOSPITAL_COMMUNITY): Payer: Commercial Managed Care - PPO

## 2011-03-18 ENCOUNTER — Ambulatory Visit: Payer: Commercial Managed Care - PPO | Admitting: Physical Therapy

## 2011-03-25 ENCOUNTER — Ambulatory Visit: Payer: Commercial Managed Care - PPO | Admitting: Physical Therapy

## 2011-04-03 ENCOUNTER — Encounter: Payer: Self-pay | Admitting: Family Medicine

## 2011-04-03 ENCOUNTER — Ambulatory Visit (INDEPENDENT_AMBULATORY_CARE_PROVIDER_SITE_OTHER): Payer: Commercial Managed Care - PPO | Admitting: Family Medicine

## 2011-04-03 DIAGNOSIS — M9981 Other biomechanical lesions of cervical region: Secondary | ICD-10-CM

## 2011-04-03 DIAGNOSIS — M999 Biomechanical lesion, unspecified: Secondary | ICD-10-CM

## 2011-04-03 NOTE — Assessment & Plan Note (Signed)
Marked improvement with HVLA supine and  Soft tissue.  Much better, less assymetry

## 2011-04-03 NOTE — Progress Notes (Signed)
  Subjective:    Patient ID: Tracey [name], female    DOB: [birth year], 60 y.o.   MRN: [id number]  HPI  Pt is here for manipulation.  Pt has been having some neck pain been a little stressed, no loss of sensation no weakness.  Pt also been having some upper back pain, no bowel or bladder problems, able to do all ADL's.  Has a muscle relaxant that helps some.  Pt had manipulation before with great decrease in symptoms.   Review of Systems Denies fever, chills, nausea vomiting abdominal pain, dysuria, chest pain, shortness of breath dyspnea on exertion or numbness in extremities     Objective:   Physical Exam    OMT Findings: Cervical: C3-6 rotated and side bent left Thoracic: T3 rotated and side bent right, T5 rotated and side bent left Lumbar:none Sacrum:none     Assessment & Plan:

## 2011-04-03 NOTE — Assessment & Plan Note (Signed)
After verbal consent pt had marked improvement of symptoms with soft tissue and muscle energy technique.

## 2011-04-09 ENCOUNTER — Ambulatory Visit: Payer: 59 | Attending: Obstetrics and Gynecology | Admitting: Physical Therapy

## 2011-04-09 DIAGNOSIS — M2550 Pain in unspecified joint: Secondary | ICD-10-CM | POA: Insufficient documentation

## 2011-04-09 DIAGNOSIS — M6281 Muscle weakness (generalized): Secondary | ICD-10-CM | POA: Insufficient documentation

## 2011-04-09 DIAGNOSIS — IMO0001 Reserved for inherently not codable concepts without codable children: Secondary | ICD-10-CM | POA: Insufficient documentation

## 2011-04-09 DIAGNOSIS — M2560 Stiffness of unspecified joint, not elsewhere classified: Secondary | ICD-10-CM | POA: Insufficient documentation

## 2011-04-09 DIAGNOSIS — R5381 Other malaise: Secondary | ICD-10-CM | POA: Insufficient documentation

## 2011-04-22 ENCOUNTER — Ambulatory Visit: Payer: 59 | Admitting: Physical Therapy

## 2011-04-22 ENCOUNTER — Other Ambulatory Visit: Payer: Self-pay | Admitting: Occupational Medicine

## 2011-04-22 ENCOUNTER — Ambulatory Visit: Payer: Self-pay

## 2011-04-22 DIAGNOSIS — R52 Pain, unspecified: Secondary | ICD-10-CM

## 2011-04-25 ENCOUNTER — Encounter: Payer: Self-pay | Admitting: Family Medicine

## 2011-04-25 ENCOUNTER — Ambulatory Visit (INDEPENDENT_AMBULATORY_CARE_PROVIDER_SITE_OTHER): Payer: Commercial Managed Care - PPO | Admitting: Family Medicine

## 2011-04-25 DIAGNOSIS — M999 Biomechanical lesion, unspecified: Secondary | ICD-10-CM

## 2011-04-25 NOTE — Assessment & Plan Note (Signed)
After verbal consent given pt had soft tissue, articulation and muscle energy done with mild improvement of symptoms. Talked about importance of exercise, keeping stretched out. Gave red flags to look out for, no signs of acute red flags at this time, pt able to ambulate well.

## 2011-04-25 NOTE — Progress Notes (Signed)
  Subjective:    Patient ID: Tracey [name], female    DOB: [birth year], 60 y.o.   MRN: [id number]  HPI   Pt is here for manipulation. Pt did fall while in her shower or over boxes does not know which caused it first.  about 1 week ago had back pain, was concern for compression fracture due to hx of osteoarthrits.  Pt had x-rays which were negative, still having pain in the middle left of her back, hurts with movement, has been trying flexeril without much improvement.  Pt had good results with manipulation and would like to try again.  Review of Systems  Denies fever, chills, nausea vomiting abdominal pain, dysuria, chest pain, shortness of breath dyspnea on exertion or numbness in extremities     Objective:   Physical Exam     OMT Findings: Cervical: none Thoracic: T3 rotated and side bent right, T5 rotated and side bent left T9 rotated and side bent left Lumbar:none Sacrum:none     Assessment & Plan:

## 2011-05-06 ENCOUNTER — Ambulatory Visit: Payer: Commercial Managed Care - PPO | Attending: Obstetrics and Gynecology | Admitting: Physical Therapy

## 2011-05-06 DIAGNOSIS — M2550 Pain in unspecified joint: Secondary | ICD-10-CM | POA: Insufficient documentation

## 2011-05-06 DIAGNOSIS — R5381 Other malaise: Secondary | ICD-10-CM | POA: Insufficient documentation

## 2011-05-06 DIAGNOSIS — IMO0001 Reserved for inherently not codable concepts without codable children: Secondary | ICD-10-CM | POA: Insufficient documentation

## 2011-05-06 DIAGNOSIS — M2560 Stiffness of unspecified joint, not elsewhere classified: Secondary | ICD-10-CM | POA: Insufficient documentation

## 2011-05-06 DIAGNOSIS — M6281 Muscle weakness (generalized): Secondary | ICD-10-CM | POA: Insufficient documentation

## 2011-05-20 ENCOUNTER — Ambulatory Visit: Payer: Commercial Managed Care - PPO | Admitting: Physical Therapy

## 2011-05-26 ENCOUNTER — Other Ambulatory Visit: Payer: Self-pay | Admitting: Family Medicine

## 2011-05-26 MED ORDER — CYCLOBENZAPRINE HCL 10 MG PO TABS: 10.0000 mg | ORAL_TABLET | Freq: Three times a day (TID) | ORAL | Status: DC | PRN

## 2011-05-26 NOTE — Telephone Encounter (Signed)
Refilled based on fax request from pharm

## 2011-05-27 ENCOUNTER — Ambulatory Visit: Payer: Commercial Managed Care - PPO | Admitting: Physical Therapy

## 2011-05-28 ENCOUNTER — Other Ambulatory Visit: Payer: Self-pay | Admitting: Family Medicine

## 2011-05-28 LAB — BASIC METABOLIC PANEL
CO2: 31 mEq/L (ref 19–32)
Calcium: 9.9 mg/dL (ref 8.4–10.5)
Chloride: 102 mEq/L (ref 96–112)
Creat: 1.75 mg/dL — ABNORMAL HIGH (ref 0.50–1.10)
Glucose, Bld: 100 mg/dL — ABNORMAL HIGH (ref 70–99)
Sodium: 142 mEq/L (ref 135–145)

## 2011-05-30 LAB — BASIC METABOLIC PANEL
BUN: 16 mg/dL (ref 6–23)
Chloride: 100 mEq/L (ref 96–112)
Creat: 0.91 mg/dL (ref 0.50–1.10)
Glucose, Bld: 87 mg/dL (ref 70–99)

## 2011-06-24 ENCOUNTER — Ambulatory Visit (HOSPITAL_COMMUNITY): Payer: 59 | Attending: Internal Medicine

## 2011-06-24 DIAGNOSIS — M810 Age-related osteoporosis without current pathological fracture: Secondary | ICD-10-CM | POA: Insufficient documentation

## 2011-07-15 ENCOUNTER — Other Ambulatory Visit: Payer: Self-pay | Admitting: Family Medicine

## 2011-07-15 MED ORDER — GABAPENTIN 800 MG PO TABS: 800.0000 mg | ORAL_TABLET | Freq: Three times a day (TID) | ORAL | Status: DC

## 2011-11-16 ENCOUNTER — Encounter (HOSPITAL_COMMUNITY): Payer: Self-pay

## 2011-11-16 ENCOUNTER — Emergency Department (INDEPENDENT_AMBULATORY_CARE_PROVIDER_SITE_OTHER): Payer: 59

## 2011-11-16 ENCOUNTER — Emergency Department (HOSPITAL_COMMUNITY)
Admission: EM | Admit: 2011-11-16 | Discharge: 2011-11-16 | Disposition: A | Discharge status: Home / Self Care | Payer: 59 | Source: Home / Self Care | Attending: Family Medicine | Admitting: Family Medicine

## 2011-11-16 DIAGNOSIS — IMO0002 Reserved for concepts with insufficient information to code with codable children: Secondary | ICD-10-CM

## 2011-11-16 DIAGNOSIS — S63509A Unspecified sprain of unspecified wrist, initial encounter: Secondary | ICD-10-CM

## 2011-11-16 DIAGNOSIS — S0081XA Abrasion of other part of head, initial encounter: Secondary | ICD-10-CM

## 2011-11-16 DIAGNOSIS — S63501A Unspecified sprain of right wrist, initial encounter: Secondary | ICD-10-CM

## 2011-11-16 HISTORY — DX: Essential (primary) hypertension: I10

## 2011-11-16 NOTE — ED Notes (Signed)
Pt fell while going into church tonight and hit lt forehead on concrete, no loc, bruising and scrape to head and rt wrist pain, denies any other pain.

## 2011-11-16 NOTE — ED Provider Notes (Addendum)
History     CSN: [id number] Arrival date & time: 11/16/2011  7:27 PM   First MD Initiated Contact with Patient 11/16/11 1857      Chief Complaint  Patient presents with  . Fall    (Consider location/radiation/quality/duration/timing/severity/associated sxs/prior treatment) Patient is a 60 y.o. female presenting with fall. The history is provided by the patient.  Fall The accident occurred 1 to 2 hours ago. The fall occurred while walking (tripped over curb at [employer], [place] left forehead and pain right wrist. no other injuries or complaints., no loc.). She landed on concrete. There was no blood loss. The point of impact was the head and right wrist. The pain is present in the right wrist. The pain is mild. She was ambulatory at the scene. There was no drug use involved in the accident. There was no alcohol use involved in the accident. Pertinent negatives include no visual change, no nausea, no vomiting, no headaches and no loss of consciousness. The symptoms are aggravated by flexion.    Past Medical History  Diagnosis Date  . Hypertension   . Osteoporosis     History reviewed. No pertinent past surgical history.  History reviewed. No pertinent family history.  History  Substance Use Topics  . Smoking status: Never Smoker   . Smokeless tobacco: Never Used  . Alcohol Use: 0.0 oz/week    0.25 drink(s) per week    OB History    Grav Para Term Preterm Abortions TAB SAB Ect Mult Living                  Review of Systems  Constitutional: Negative.   Gastrointestinal: Negative for nausea and vomiting.  Musculoskeletal: Negative for back pain, joint swelling and gait problem.  Skin: Positive for wound.  Neurological: Negative for dizziness, loss of consciousness, syncope, weakness and headaches.    Allergies  Cefaclor and Triple antibiotic  Home Medications   Current Outpatient Rx  Name Route Sig Dispense Refill  . ALENDRONATE SODIUM 70 MG PO TABS Oral Take  70 mg by mouth every 7 (seven) days. Take in the morning with a full glass of water, on an empty stomach, and do not take anything else by mouth or lie down for the next 30 min.     Marland Kitchen ERGOCALCIFEROL 50000 UNITS PO CAPS Oral Take 50,000 Units by mouth once a week.      Marland Kitchen FLUTICASONE PROPIONATE 50 MCG/ACT NA SUSP Nasal 1 spray by Nasal route daily.      Marland Kitchen FOLIC ACID 1 MG PO TABS Oral Take 1 mg by mouth daily.      Marland Kitchen GABAPENTIN 800 MG PO TABS Oral Take 1 tablet (800 mg total) by mouth 3 (three) times daily. 90 tablet 1  . PAROXETINE HCL 20 MG PO TABS Oral Take 1 tablet (20 mg total) by mouth daily. 30 tablet 2  . RANITIDINE HCL 150 MG PO TABS Oral Take 1 tablet (150 mg total) by mouth 2 (two) times daily. 60 tablet 11    BP 146/83  Pulse 96  Temp(Src) 97.6 F (36.4 C) (Oral)  Resp 18  SpO2 100%  Physical Exam  Nursing note and vitals reviewed. Constitutional: She is oriented to person, place, and time. She appears well-developed and well-nourished.  HENT:  Head: Normocephalic.  Right Ear: External ear normal.  Left Ear: External ear normal.  Eyes: Pupils are equal, round, and reactive to light.  Neck: Normal range of motion. Neck supple.  Musculoskeletal:  She exhibits tenderness. She exhibits no edema.       Right wrist: She exhibits decreased range of motion and tenderness. She exhibits no swelling and no deformity.  Neurological: She is alert and oriented to person, place, and time.  Skin:       ED Course  Procedures (including critical care time)  Labs Reviewed - No data to display Dg Wrist Complete Right  11/16/2011  *RADIOLOGY REPORT*  Clinical Data: Fall, wrist pain  RIGHT WRIST - COMPLETE 3+ VIEW  Comparison: None.  Findings: No fracture or dislocation is seen.  The joint spaces are preserved.  The visualized soft tissues are unremarkable.  IMPRESSION: No fracture or dislocation is seen.  Original Report Authenticated By: Charline Bills, M.D.     1. Sprain of wrist,  right   2. Forehead abrasion       MDM  X-rays reviewed and report per radiologist.         Barkley Bruns, MD 11/16/11 2006  Barkley Bruns, MD 11/16/11 2007

## 2011-11-26 ENCOUNTER — Encounter: Payer: Self-pay | Admitting: Family Medicine

## 2011-11-26 ENCOUNTER — Ambulatory Visit (HOSPITAL_COMMUNITY)
Admission: RE | Admit: 2011-11-26 | Discharge: 2011-11-26 | Disposition: A | Discharge status: Home / Self Care | Payer: 59 | Source: Ambulatory Visit | Attending: Family Medicine | Admitting: Family Medicine

## 2011-11-26 DIAGNOSIS — M25519 Pain in unspecified shoulder: Secondary | ICD-10-CM | POA: Insufficient documentation

## 2011-11-26 NOTE — Progress Notes (Signed)
  Subjective:    Patient ID: Tracey [name], female    DOB: August 26, 1951, 60 y.o.   MRN: [id number]  HPI Larey Seat one week ago.  Rt. Shoulder still painful.  Hx of both osteoporosis and rt rotator cuff syndrome.  Will check Xrays.    Review of Systems     Objective:   Physical Exam        Assessment & Plan:

## 2012-01-01 ENCOUNTER — Other Ambulatory Visit: Payer: Self-pay | Admitting: Family Medicine

## 2012-01-01 MED ORDER — TRIAMCINOLONE ACETONIDE 0.1 % EX CREA: TOPICAL_CREAM | Freq: Two times a day (BID) | CUTANEOUS | Status: DC

## 2012-01-01 MED ORDER — MUPIROCIN 2 % EX OINT: TOPICAL_OINTMENT | Freq: Three times a day (TID) | CUTANEOUS | Status: AC

## 2012-03-23 ENCOUNTER — Ambulatory Visit (INDEPENDENT_AMBULATORY_CARE_PROVIDER_SITE_OTHER): Payer: 59 | Admitting: Family Medicine

## 2012-03-23 ENCOUNTER — Encounter: Payer: Self-pay | Admitting: Family Medicine

## 2012-03-23 VITALS — BP 131/74 | HR 82

## 2012-03-23 DIAGNOSIS — M62838 Other muscle spasm: Secondary | ICD-10-CM | POA: Insufficient documentation

## 2012-03-23 DIAGNOSIS — M999 Biomechanical lesion, unspecified: Secondary | ICD-10-CM

## 2012-03-23 DIAGNOSIS — M9981 Other biomechanical lesions of cervical region: Secondary | ICD-10-CM

## 2012-03-23 MED ORDER — KETOROLAC TROMETHAMINE 30 MG/ML IJ SOLN
30.0000 mg | Freq: Once | INTRAMUSCULAR | Status: AC
  Administered 2012-03-23: 30 mg via INTRAMUSCULAR

## 2012-03-23 NOTE — Assessment & Plan Note (Signed)
Decision today to treat with OMT was based on Physical Exam  After verbal consent patient was treated with articular and muscle energy techniques in cervicothoracic areas  Patient tolerated the procedure well with improvement in symptoms  Patient given exercises, stretches and lifestyle modifications  See medications in patient instructions if given  Patient will follow up in 4-6 weeks or as needed Patient is given a shot of Toradol.

## 2012-03-23 NOTE — Progress Notes (Signed)
  Subjective:    Patient ID: Tracey [name], female    DOB: 02/02/1951, 61 y.o.   MRN: [id number]  HPI 61 year old female coming in with upper back and neck pain. Patient has seen me on an occasional basis for manipulation therapy do to these chronic muscle spasms and back pain. Patient states that this has been gradually worsening over the course of the last weeks. Unfortunately during the last 6 weeks she did have a very close friend pass away. Patient has been somewhat distraught over this. Patient does become tearful when discussing it. Patient though denies any numbness or weakness in the upper extremities or legs. Patient states that she does have some what a headache but not like her regular migraines and mostly muscle pain. Denies any visual changes.   Review of Systems Denies fever, chills, abdominal pain, nausea or vomiting. Patient denies any signs of syncopal episodes.    Objective:   Physical Exam General: No apparent distress minorly tearful when discussing friend. Muscle skeletal exam Neck: Negative spurling's Full neck range of motion Grip strength and sensation normal in bilateral hands Strength good C4 to T1 distribution No sensory change to C4 to T1 Reflexes normal Patient does have mild decreased range of motion secondary to muscle spasm. OMT Physical Exam  Standing structural       Occiput left-sided higher  Shoulder  Inferior angle of scapula left-sided higher    Cervical  Seek to extended rotated and side bent left C5 rotated and side bent right in extension  Thoracic T3-5 neutral position group curve rotated left side bent right  Lumbar neutral             Assessment & Plan:

## 2012-03-23 NOTE — Assessment & Plan Note (Signed)
Likely made worse secondary to anxiety and stress. Patient given a shot of Toradol 30 mg today. Manipulation today see below.

## 2012-03-23 NOTE — Progress Notes (Signed)
Addended by: Jone Baseman D on: 03/23/2012 10:35 AM   Modules accepted: Orders

## 2012-04-01 ENCOUNTER — Other Ambulatory Visit: Payer: Self-pay | Admitting: Obstetrics and Gynecology

## 2012-04-01 DIAGNOSIS — Z1231 Encounter for screening mammogram for malignant neoplasm of breast: Secondary | ICD-10-CM

## 2012-04-19 ENCOUNTER — Ambulatory Visit
Admission: RE | Admit: 2012-04-19 | Discharge: 2012-04-19 | Disposition: A | Discharge status: Home / Self Care | Payer: 59 | Source: Ambulatory Visit | Attending: Obstetrics and Gynecology | Admitting: Obstetrics and Gynecology

## 2012-04-19 DIAGNOSIS — Z1231 Encounter for screening mammogram for malignant neoplasm of breast: Secondary | ICD-10-CM

## 2012-04-21 ENCOUNTER — Other Ambulatory Visit: Payer: Self-pay | Admitting: Obstetrics and Gynecology

## 2012-04-21 DIAGNOSIS — R928 Other abnormal and inconclusive findings on diagnostic imaging of breast: Secondary | ICD-10-CM

## 2012-04-22 ENCOUNTER — Ambulatory Visit
Admission: RE | Admit: 2012-04-22 | Discharge: 2012-04-22 | Disposition: A | Discharge status: Home / Self Care | Payer: 59 | Source: Ambulatory Visit | Attending: Obstetrics and Gynecology | Admitting: Obstetrics and Gynecology

## 2012-04-22 DIAGNOSIS — R928 Other abnormal and inconclusive findings on diagnostic imaging of breast: Secondary | ICD-10-CM

## 2012-04-28 ENCOUNTER — Ambulatory Visit: Payer: 59 | Admitting: Family Medicine

## 2012-04-29 ENCOUNTER — Encounter: Payer: Self-pay | Admitting: Family Medicine

## 2012-04-29 ENCOUNTER — Ambulatory Visit (INDEPENDENT_AMBULATORY_CARE_PROVIDER_SITE_OTHER): Payer: 59 | Admitting: Family Medicine

## 2012-04-29 ENCOUNTER — Other Ambulatory Visit: Payer: 59

## 2012-04-29 DIAGNOSIS — M549 Dorsalgia, unspecified: Secondary | ICD-10-CM | POA: Insufficient documentation

## 2012-04-29 DIAGNOSIS — M999 Biomechanical lesion, unspecified: Secondary | ICD-10-CM

## 2012-04-29 NOTE — Assessment & Plan Note (Signed)
Muscle skeletal in nature mostly in the thoracic and lumbar regions. Do think they are stress-induced mostly. Given exercises and strength conditioning for core.

## 2012-04-29 NOTE — Progress Notes (Signed)
Patient ID: Tracey [name], female   DOB: Apr 23, 1951, 61 y.o.   MRN: [id number] Patient is here for manipulation therapy. Patient states she's having the same pains his usual. Most of it has been the neck and upper back area. Patient states she's been having some lower back on the right side as well. No radiation to the extremities no bowel or bladder incontinence. No other changes in medications no other red flags no fevers or weight loss.   Physical exam General: No apparent distress minorly tearful when discussing friend. Muscle skeletal exam OMT Physical Exam  Standing structural       Occiput left-sided higher  Shoulder  Inferior angle of scapula left-sided higher    Cervical  Seek to extended rotated and side bent left C5 rotated and side bent right in extension  Thoracic T3-5 neutral position group curve rotated left side bent right  Lumbar how to flexed rotated and side bent right  Sacrum right on right

## 2012-04-29 NOTE — Patient Instructions (Signed)
Patient given verbal instructions 

## 2012-04-29 NOTE — Assessment & Plan Note (Signed)
Decision today to treat with OMT was based on Physical Exam  After verbal consent patient was treated with muscle energy techniques in cervical, thoracic and lumbar areas  Patient tolerated the procedure well with improvement in symptoms  Patient given exercises, stretches and lifestyle modifications  See medications in patient instructions if given  Patient will follow up in 4 weeks

## 2012-06-17 ENCOUNTER — Other Ambulatory Visit: Payer: Self-pay | Admitting: Family Medicine

## 2012-06-18 LAB — BASIC METABOLIC PANEL
CO2: 33 mEq/L — ABNORMAL HIGH (ref 19–32)
Chloride: 102 mEq/L (ref 96–112)
Creat: 0.98 mg/dL (ref 0.50–1.10)

## 2012-06-21 ENCOUNTER — Encounter: Payer: Self-pay | Admitting: Family Medicine

## 2012-06-24 ENCOUNTER — Encounter: Payer: Self-pay | Admitting: Obstetrics and Gynecology

## 2012-07-12 ENCOUNTER — Other Ambulatory Visit (HOSPITAL_COMMUNITY): Payer: Self-pay | Admitting: *Deleted

## 2012-07-13 ENCOUNTER — Ambulatory Visit (HOSPITAL_COMMUNITY)
Admission: RE | Admit: 2012-07-13 | Discharge: 2012-07-13 | Disposition: A | Discharge status: Home / Self Care | Payer: 59 | Source: Ambulatory Visit | Attending: Internal Medicine | Admitting: Internal Medicine

## 2012-07-13 DIAGNOSIS — M810 Age-related osteoporosis without current pathological fracture: Secondary | ICD-10-CM | POA: Insufficient documentation

## 2012-07-13 MED ORDER — ZOLEDRONIC ACID 5 MG/100ML IV SOLN
5.0000 mg | Freq: Once | INTRAVENOUS | Status: AC
  Administered 2012-07-13: 5 mg via INTRAVENOUS
  Filled 2012-07-13: qty 100

## 2012-09-12 ENCOUNTER — Ambulatory Visit (INDEPENDENT_AMBULATORY_CARE_PROVIDER_SITE_OTHER): Payer: 59 | Admitting: Family Medicine

## 2012-09-12 ENCOUNTER — Ambulatory Visit: Payer: 59

## 2012-09-12 VITALS — BP 163/96 | HR 90 | Temp 97.9°F | Resp 16 | Ht 66.0 in | Wt 106.6 lb

## 2012-09-12 DIAGNOSIS — S99929A Unspecified injury of unspecified foot, initial encounter: Secondary | ICD-10-CM

## 2012-09-12 DIAGNOSIS — R0789 Other chest pain: Secondary | ICD-10-CM

## 2012-09-12 DIAGNOSIS — S8990XA Unspecified injury of unspecified lower leg, initial encounter: Secondary | ICD-10-CM

## 2012-09-12 DIAGNOSIS — S2341XA Sprain of ribs, initial encounter: Secondary | ICD-10-CM

## 2012-09-12 DIAGNOSIS — S92153A Displaced avulsion fracture (chip fracture) of unspecified talus, initial encounter for closed fracture: Secondary | ICD-10-CM

## 2012-09-12 DIAGNOSIS — M25579 Pain in unspecified ankle and joints of unspecified foot: Secondary | ICD-10-CM

## 2012-09-12 DIAGNOSIS — IMO0002 Reserved for concepts with insufficient information to code with codable children: Secondary | ICD-10-CM

## 2012-09-12 DIAGNOSIS — S92009A Unspecified fracture of unspecified calcaneus, initial encounter for closed fracture: Secondary | ICD-10-CM

## 2012-09-12 NOTE — Progress Notes (Signed)
[address]   [place], [place]  [address]   [id number]  Subjective:    Patient ID: Tracey [name], female    DOB: Mar 07, 1951, 61 y.o.   MRN: [id number]  HPIThis 61 y.o. female presents for evaluation of L foot/ankle pain.  At church this morning and walking down steps; twisted ankle on L. Immediate L foot swelling laterally.  Pain with weight bearing and range of motion.  +tingling in toes.  Osteoporosis maintained on Fosamax.  No previous foot or ankle injury on L.  Has been elevating foot in the office.    2.  Epigastric pain:  Onset three months ago.  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  Only occurs when comes up from flexed position, feels like a catch.  Feels almost like a broken rib but does not hurt constantly. No SOB.  No pain with deep breathing. No recent cough or congestion.   Occurs at base of breast bone; on rib L or breast bone.  Takes 5 minutes to resolve; sharp shooting and pressure pain. For past two weeks, has become annoying and more frequent in nature.  Not constant; did not bother yesterday much.  Feels like a pressure.  No nausea; appetite normal; no weight loss.  Food has no effect on pain.  Normal bowel movements.  No melena or bloody stools.  No associated urinary symptoms.  No regular exercise; has been working long hours at work.    3. Epigatric burning: has burning in epigastric region; history of ulcer.  No nausea; no vomiting, diarrhea, constipation, bloody stools, or melena.  Appetite normal.     Review of Systems  Constitutional: Negative for fever, chills, diaphoresis, activity change, appetite change, fatigue and unexpected weight change.  Respiratory: Negative for cough, chest tightness, shortness of breath, wheezing and stridor.   Cardiovascular: Positive for chest pain. Negative for palpitations and leg swelling.  Gastrointestinal: Positive for abdominal pain. Negative for nausea, vomiting, diarrhea, constipation,  blood in stool, abdominal distention and rectal pain.  Genitourinary: Negative for dysuria, frequency, hematuria and flank pain.  Musculoskeletal: Positive for myalgias, joint swelling, arthralgias and gait problem.  Skin: Negative for rash.  Neurological: Positive for numbness. Negative for weakness.    Past Medical History  Diagnosis Date  . Hypertension   . Osteoporosis     Past Surgical History  Procedure Date  . [place] luc     Prior to Admission medications   Medication Sig Start Date End Date Taking? Authorizing Provider  fluticasone (FLONASE) 50 MCG/ACT nasal spray Place 1 spray into the nose as needed.    Yes Historical Provider, MD  folic acid (FOLVITE) 1 MG tablet Take 1 mg by mouth daily.     Yes Historical Provider, MD  gabapentin (NEURONTIN) 800 MG tablet Take 1 tablet (800 mg total) by mouth 3 (three) times daily. 07/15/11  Yes Nestor Ramp, MD  triamcinolone cream (KENALOG) 0.1 % Apply topically as needed. 01/01/12 12/31/12 Yes Sanjuana Letters, MD  alendronate (FOSAMAX) 70 MG tablet Take 70 mg by mouth every 7 (seven) days. Take in the morning with a full glass of water, on an empty stomach, and do not take anything else by mouth or lie down for the next 30 min.     Historical Provider, MD  ergocalciferol (VITAMIN D2) 50000 UNIT capsule Take 50,000 Units by mouth once a week.      Historical Provider, MD  PARoxetine (  PAXIL) 20 MG tablet Take 1 tablet (20 mg total) by mouth daily. 02/11/11 02/11/12  Sanjuana Letters, MD  ranitidine (ZANTAC) 150 MG tablet Take 1 tablet (150 mg total) by mouth 2 (two) times daily. 02/07/11 02/07/12  Sanjuana Letters, MD    Allergies  Allergen Reactions  . Cefaclor     REACTION: urticaria (hives)  . Neomycin-Bacitracin Zn-Polymyx     REACTION: rash    History   Social History  . Marital Status: Married    Spouse Name: N/A    Number of Children: N/A  . Years of Education: N/A   Occupational History  . Not on file.    Social History Main Topics  . Smoking status: Never Smoker   . Smokeless tobacco: Never Used  . Alcohol Use: 0.0 oz/week    0.25 drink(s) per week  . Drug Use: No  . Sexually Active: Yes -- Female partner(s)     married, [place]   Other Topics Concern  . Not on file   Social History Narrative  . No narrative on file    Family History  Problem Relation Age of Onset  . Hypertension Mother   . Leukemia Father   . Hypertension Father   . Diabetes Brother   . Hypertension Brother   . Hyperlipidemia Maternal Grandmother   . Cancer Maternal Grandfather   . Heart disease Paternal Grandfather   . Stroke Paternal Grandfather         Objective:   Physical Exam  Nursing note and vitals reviewed. Constitutional: She is oriented to person, place, and time. She appears well-developed and well-nourished. No distress.  Eyes: Conjunctivae normal are normal. Pupils are equal, round, and reactive to light.  Neck: Normal range of motion. Neck supple. No thyromegaly present.  Cardiovascular: Normal rate, regular rhythm, normal heart sounds and intact distal pulses.   No murmur heard. Pulmonary/Chest: Effort normal and breath sounds normal. No respiratory distress. She has no wheezes. She has no rales. She exhibits tenderness.       +TTP L INFERIOR RIBS ADJACENT TO STERNUM AT COSTOCHONDRAL JUNCTION AND EXTENDS LATERALLY. NO TTP STERNUM.    Abdominal: Soft. Bowel sounds are normal. She exhibits no distension and no mass. There is no tenderness. There is no rebound and no guarding.  Musculoskeletal:       Right shoulder: She exhibits normal range of motion, no tenderness, no bony tenderness, no swelling, no pain and no spasm.       Left shoulder: She exhibits normal range of motion, no tenderness, no bony tenderness, no deformity, no pain and no spasm.       Left ankle: She exhibits decreased range of motion and swelling. She exhibits no ecchymosis, no deformity, no laceration and normal  pulse. tenderness. No lateral malleolus, no medial malleolus, no head of 5th metatarsal and no proximal fibula tenderness found. Achilles tendon normal. Achilles tendon exhibits no pain.       Lumbar back: She exhibits normal range of motion, no tenderness, no bony tenderness, no swelling, no pain and no spasm.       Left foot: She exhibits decreased range of motion, tenderness, bony tenderness and swelling. She exhibits normal capillary refill, no crepitus and no deformity.       L FOOT:  +TTP PROXIMAL FOOT LATERALLY; +LOCALIZED SWELLING PROXIMAL LATERAL FOOT.  PAIN WITH DORSIFLEXION, PLANTAR FLEXION.  NO 5TH METATARSAL TTP. CHEST: +TTP INFERIOR ASPECT OF L RIBS; NO STERNAL TTP; NO R RIB TTP.  LUMBAR SPINE: FULL ROM WITH PAIN IN L ANTERIOR RIBS REPRODUCED WITH ROTATION SIDE TO SIDE, LATERAL BENDING, EXTENSION.  Lymphadenopathy:    She has no cervical adenopathy.  Neurological: She is alert and oriented to person, place, and time. No sensory deficit.  Skin: Skin is warm and dry. No rash noted. She is not diaphoretic. No erythema. No pallor.  Psychiatric: She has a normal mood and affect. Her behavior is normal.       TEARFUL.   [facility] reading (PRIMARY) by  Dr. Katrinka Blazing.  L ANKLE: NO ACUTE FINDINGS; +AVULSION FRACTURE TALAR REGION.  L FOOT: +SMALL TALAR AVULSION FRACTURE.  L RIBS: NO ACUTE FRACTURE.  (Radiology over-read also diagnosed small avulsion fracture from calcaneus).       Assessment & Plan:   1. Foot injury  DG Foot Complete Left, DG Ankle Complete Left  2. Ankle pain  DG Foot Complete Left, DG Ankle Complete Left  3. Chest pain, muscular  DG Ribs Unilateral W/Chest Left  4. Sprain and strain of ribs    5. Avulsion fracture of talus    6. Avulsion fracture of calcaneus    7.  Osteoporosis   1.  Pain Foot/Ankle L: New.  Recommend Tylenol PRN.   2.  Avulsion Fracture L of Talus:  New.  Short CAM walker fitted in office.  Recommend icing, elevation, Tylenol for pain.  Rest.  Refer to  sports medicine for further management; pt desires to schedule own appointment; will call office if needs assistance with scheduling appointment. 3.  Avulsion Fracture of L Calcaneus: New.  Short CAM walker placed; refer to sports medicine for further management. 4.  Chest pain Muscular: New.  Consistent with musculoskeletal etiology.   5.  Strain/Contusion L ribs: New.  Recommend rest.  Avoid activities that worsen pain.  Avoid repetitive lifiting, bending, twisting for next 2-3 weeks.  Recommend passive ROM. 6.  Osteoporosis: stable.

## 2012-09-12 NOTE — Patient Instructions (Addendum)
1. Foot injury  DG Foot Complete Left, DG Ankle Complete Left  2. Ankle pain  DG Foot Complete Left, DG Ankle Complete Left  3. Chest pain, muscular  DG Ribs Unilateral W/Chest Left  4. Sprain and strain of ribs      YOU HAVE AN AVULSION FRACTURE OF ONE OF YOUR FOOT BONES.   WEAR CAM WALKER AT ALL TIMES DURING THE DAY WITH AMBULATION. ICE AND ELEVATE FOOT FOR THE NEXT 1-2 DAYS. FOLLOW-UP WITH SPORTS MEDICINE CLINIC IN [place] 5-10 DAYS.  PLEASE CALL IF YOU NEED OUR OFFICE TO MAKE REFERRAL/APPOINTMENT FOR YOU.

## 2012-09-15 DIAGNOSIS — S92153A Displaced avulsion fracture (chip fracture) of unspecified talus, initial encounter for closed fracture: Secondary | ICD-10-CM | POA: Insufficient documentation

## 2012-09-15 DIAGNOSIS — IMO0002 Reserved for concepts with insufficient information to code with codable children: Secondary | ICD-10-CM | POA: Insufficient documentation

## 2012-09-15 DIAGNOSIS — S92009A Unspecified fracture of unspecified calcaneus, initial encounter for closed fracture: Secondary | ICD-10-CM | POA: Insufficient documentation

## 2012-09-20 ENCOUNTER — Ambulatory Visit (INDEPENDENT_AMBULATORY_CARE_PROVIDER_SITE_OTHER): Payer: 59 | Admitting: Family Medicine

## 2012-09-20 VITALS — BP 124/78 | Ht 66.0 in | Wt 110.0 lb

## 2012-09-20 DIAGNOSIS — M25579 Pain in unspecified ankle and joints of unspecified foot: Secondary | ICD-10-CM

## 2012-09-21 NOTE — Progress Notes (Signed)
  Subjective:    Patient ID: Tracey [name], female    DOB: 03-Jun-1951, 61 y.o.   MRN: [id number]  HPI Date of injury September 12, 2012. Was at church walking down the hallway fairly quickly when she slipped down a step and injured her left foot. Had immediate pain. Was seen at the urgent care Center where they diagnosed her with a small evulsion fracture placed her in a Cam Walker boot. She's having a lot of pain. It does not seem to fit well. She has continued to be up on her ankle quite a bit and has noticed that today the swelling and  is red. Her toes feel tight.   Review of Systems Denies fever. No numbness in her toes.    Objective:   Physical Exam  Vital signs are reviewed GENERAL: Well-developed female no acute distress ANKLE: Left. Soft tissue swelling. There is erythema and warmth over the area of the ATF. There is a lot of ecchymosis and bruising all down the plantar portion and into the toes one through 5. Sensation is intact. Dorsalis pedis and posterior tibialis pulses are intact and 2+. Ankle flexion is full at plantar and dorsiflexion. She can't J[place] and invert her foot but has some pain with both. ULTRASOUND: The tibiotalar joint is without effusion. The peroneal tendons are seen and without defect. I'll follow them all along fair length. There is some fluid in the tendon sheath of both of these. There is a small evulsion fracture at the bone off the talus and there is some fluid surrounding this area. OTHER imaging. I reviewed the x-rays and agree with their assessment.      Assessment & Plan:  #1. Ankle sprain with small avulsion fracture off the table as. I think she's been using it too much she has a lot of soft tissue swelling today and trauma from the [employer] boot. I placed her in a bulky wrap. I would recommend she did not work today or tomorrow and then when she returns to work she limited to 8 hour days with elevation at lunch time. No excessive walking in her off  hours. Keep this up as much as possible. She does not have to wear the boot at night. I'll see her back in 2 weeks. At that point I would start some range of motion in dorsiflexion/plantar flexion strengthening exercises.

## 2012-09-22 ENCOUNTER — Ambulatory Visit: Payer: 59 | Admitting: Sports Medicine

## 2012-09-29 NOTE — Progress Notes (Signed)
Reviewed and agree.

## 2012-10-04 ENCOUNTER — Ambulatory Visit (INDEPENDENT_AMBULATORY_CARE_PROVIDER_SITE_OTHER): Payer: 59 | Admitting: Sports Medicine

## 2012-10-04 ENCOUNTER — Encounter: Payer: Self-pay | Admitting: Sports Medicine

## 2012-10-04 VITALS — BP 147/83 | HR 94 | Ht 66.0 in | Wt 110.0 lb

## 2012-10-04 DIAGNOSIS — S92153A Displaced avulsion fracture (chip fracture) of unspecified talus, initial encounter for closed fracture: Secondary | ICD-10-CM

## 2012-10-04 DIAGNOSIS — M25519 Pain in unspecified shoulder: Secondary | ICD-10-CM

## 2012-10-04 DIAGNOSIS — S92109A Unspecified fracture of unspecified talus, initial encounter for closed fracture: Secondary | ICD-10-CM

## 2012-10-04 MED ORDER — DICLOFENAC SODIUM 1 % TD GEL: TRANSDERMAL | Status: DC

## 2012-10-04 NOTE — Progress Notes (Signed)
Patient ID: Tracey [name], female   DOB: 02-Jul-1951, 61 y.o.   MRN: [id number]  SUBJECTIVE: Tracey [name] is a 61 y.o. female who originally injured her foot approx 3 weeks ago while walking down a hallway at church then slipping down a step, injuring her foot.  Immediate pain at that time and was seen at urgent care center with X-rays diagnosing small avulsion fracture of foot.  She was evaluated by Dr. Jennette Kettle at [facility] on 20-Sep-2012 and placed into a Cam Walker boot with addition of ACE WRAPS to help provide support.  U/S findings at that time showed moderate amount of fluid around peroneal tendons, confirmed fracture as well.  Today, she reports still wearing CAM boot from start of work until approx 08:00 PM when she gets off her feet for the day.  She reports that after taking the boot off she can ambulate pain free but wears boot primarily for support at work as she is on her feet all day in the lab.  Denies any popping, clicking, catching or instability at any time.  Denies any radicular symptoms.  Improved since original presentation with all edema now resolved.  She has some tenderness along peroneal tendons.  Also, complaining of some shoulder pain on RIGHT side.  Reports shoulder pain occasionally wakes her up at night when she rolls onto that side.  Denies any instability, popping, catching or clicking.  Denies radicular symptoms.  Majority pain over anterior, superior aspect of shoulder.  Primarily hurts with with horizontal adduction.  Previous problems with RC on same side, treated with injection last occurring years ago but with great relief.  OBJECTIVE: Vital signs as noted above. Appearance: Well-appearing, thin female resting comfortably in exam room  Foot/ankle exam: - Symmetric and appropriate on inspection, previously reported edema resolved.  No erythema. - AROM, PROM are equal bilaterally in ankle dorsiflexion, plantar flexion and eversion/inversion. - 5/5 Strength in  the above movements, without pain during testing. - On palpation she has mild tenderness along posterior aspect of lateral fibula over the peroneal tendon's, especially when palpation while she actively everts.  No tenderness to palpation over boney landmarks. - Anterior drawer and talar tilt negative bilaterally with firm end-point  Shoulder exam: - Symmetric and appropriate on inspection - AROM, PROM are preserved bilaterally with no limitations or pain in shoulder flexion, extension, IR, ER, abduction and adduction - 5/5 strength in the above movements without any pain or reproduction of symptoms - Negative Speed's and Yergeuson's bilaterally - Negative Full Can, Empty Can bilaterally - Negative Kennedy-Hawkins, Neers bilaterally - Negative O'Brien's bilaterally - POSITIVE Cross-Arm Test on Right, worse when resisting horizontal abduction in that movement - Tender to palpation over the RIGHT AC joint, especially with compression.  No tenderness to palpation over any other boney landmarks of bilateral shoulders.  U/S LEFT Ankle: - LEFT ankle U/S reveals known avulsion fracture segment visualized coming off talus in the mortise view - LEFT peroneal tendons visualized on approx 5 cm proximal to lateral malleoli with no surrounding fluid.  The tendons were followed distally to origins.  Fluid surrounding tendon sheaths visuzlized at level of malleoli and just distal to it.  U/S RIGHT Shoulder: - Biceps tendon visualized in long and transverse views between the greater and lesser tuberosity of humerus, without abnormality - Subscapularis visualized at insertion without abnormality in long view - Supraspinatus visualized at insertion and proximal to that area in long view.  No increased doppler flow or  calcifications visualized.  One small area of hypoechoic change visualized. - Subacromial bursa seen in supraspinatus view and appears to have slight increase in size - Infraspinatus and teres  minor visualized in long view.  Both without abnormality. - AC joint visualized in long view with possible joint space narrowing.  Calcification and one spur noted on acromial half of the joint.  No mushroom sign or changes in capsule identified.  ASSESSMENT: 1. Inversion sprain of left foot, healing 2. Avulsion fracture of left talar dome secondary to inversion injury, healing. 2. Peroneal tendon strain secondary to inversion injury, healing 3. 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  First problem stems from original inversion ankle injury from fall at church.  Pt is progressing appropriately and now ambulates pain-free, even when out of boot.  She will progress from CAM boot to wearing body helix sleeve over right ankle at work and approx 1 hour afterward.  She was provided with and instructed on ankle ROM and rehabilitation exercises and given resistance band.  She will also use topical NSAID cream and ice massage as needed for peroneal tendon strain from same original mechanism.  Both are healing appropriately and moving in right direction.  New shoulder problem appears to be Right AC joint arthritis with flair, no mechanism to support sprain in hx but pt is very point-tender over joint.  She will treat with relative rest and can use NSAID cream as needed for this area as well. Can discuss repeat steroid injection to expedite healing of flair at f/u appt.  She will follow-up in 3 weeks for both conditions or sooner if complication/problem(s) arise.  -- Kenney Houseman, MS IV

## 2012-10-26 ENCOUNTER — Ambulatory Visit (INDEPENDENT_AMBULATORY_CARE_PROVIDER_SITE_OTHER): Payer: 59 | Admitting: Sports Medicine

## 2012-10-26 VITALS — BP 120/70 | Ht 66.0 in | Wt 110.0 lb

## 2012-10-26 DIAGNOSIS — S92153A Displaced avulsion fracture (chip fracture) of unspecified talus, initial encounter for closed fracture: Secondary | ICD-10-CM

## 2012-10-26 DIAGNOSIS — S92109A Unspecified fracture of unspecified talus, initial encounter for closed fracture: Secondary | ICD-10-CM

## 2012-10-26 NOTE — Progress Notes (Signed)
  Subjective:    Patient ID: Tracey [name], female    DOB: [birth year], 61 y.o.   MRN: [id number]  HPI Tracey [name] comes in today for followup. Left ankle is feeling much better. Right shoulder is also feeling better after using Voltaren gel for a few days over the a.c. Joint. In regards to the ankle, she states that she is about 90% improved. Walking with a limp. She's using her body helix compression sleeve. She admits that she has not been very diligent about her home exercises though.    Review of Systems     Objective:   Physical Exam Well-developed, well-nourished. No acute distress.  Left ankle: Full range of motion. No effusion. No soft tissue swelling. There still some tenderness to palpation at the ATF but a negative anterior drawer and a negative talar tilt. No tenderness along the peroneal tendons. Neurovascular intact distally. Walking without a limp.       Assessment & Plan:  1. Improving left ankle pain secondary to small talar avulsion fracture 2. 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 I did explain to her the importance of ankle strengthening and proprioception in an effort to help prevent reinjury. She may wean from her body helix compression sleeve as her symptoms allow. She can increase activity as tolerated. She will followup for ongoing or recalcitrant issues.

## 2012-11-09 ENCOUNTER — Ambulatory Visit: Payer: 59 | Admitting: Obstetrics and Gynecology

## 2012-12-03 ENCOUNTER — Other Ambulatory Visit: Payer: Self-pay | Admitting: Obstetrics and Gynecology

## 2012-12-29 ENCOUNTER — Encounter: Payer: Self-pay | Admitting: Obstetrics and Gynecology

## 2012-12-29 ENCOUNTER — Other Ambulatory Visit (HOSPITAL_COMMUNITY)
Admission: RE | Admit: 2012-12-29 | Discharge: 2012-12-29 | Disposition: A | Discharge status: Home / Self Care | Payer: 59 | Source: Ambulatory Visit | Attending: Obstetrics and Gynecology | Admitting: Obstetrics and Gynecology

## 2012-12-29 ENCOUNTER — Ambulatory Visit: Payer: 59 | Admitting: Obstetrics and Gynecology

## 2012-12-29 VITALS — BP 122/74 | Ht 66.75 in | Wt 111.0 lb

## 2012-12-29 DIAGNOSIS — N952 Postmenopausal atrophic vaginitis: Secondary | ICD-10-CM

## 2012-12-29 DIAGNOSIS — Z124 Encounter for screening for malignant neoplasm of cervix: Secondary | ICD-10-CM

## 2012-12-29 DIAGNOSIS — Z1151 Encounter for screening for human papillomavirus (HPV): Secondary | ICD-10-CM | POA: Insufficient documentation

## 2012-12-29 DIAGNOSIS — M810 Age-related osteoporosis without current pathological fracture: Secondary | ICD-10-CM

## 2012-12-29 DIAGNOSIS — Z01419 Encounter for gynecological examination (general) (routine) without abnormal findings: Secondary | ICD-10-CM | POA: Insufficient documentation

## 2012-12-29 MED ORDER — ESTRADIOL 10 MCG VA TABS: ORAL_TABLET | VAGINAL | Status: DC

## 2012-12-29 NOTE — Progress Notes (Signed)
Subjective:    Tracey [name] is a 62 y.o. female No obstetric history on file. who presents for annual exam.  The patient has no complaints today.   Last Pap: 10/2008 WNL: Yes Regular Periods:no Contraception: none  Monthly Breast exam:yes Tetanus<51yrs:yes Nl.Bladder Function:yes Daily BMs:no Healthy Diet:no Calcium:yes Mammogram:yes Date of Mammogram: 04/01/12 Exercise:yes Have often Exercise: daily walking Seatbelt: yes Abuse at home: no Stressful work:no Sigmoid-colonoscopy: age 1-wnl per pt Due 2015 Bone Density: Yes 05/25/12-osteopenia @ [facility].  Hx osteoporosis being treated by Dr. Sharl Ma with Reclast annually.  Has gotten 2 doses PCP: Dr. Clent Ridges Change in PMH: no change Change in FMH:no change BMI-17  The following portions of the patient's history were reviewed and updated as appropriate: allergies, current medications, past family history, past medical history, past social history, past surgical history and problem list.  Review of Systems Pertinent items are noted in HPI. Gastrointestinal:No change in bowel habits, no abdominal pain, no rectal bleeding Genitourinary:negative for dysuria, frequency, hematuria, nocturia and urinary incontinence    Objective:     Ht 5' 6.75" (1.695 m)  Wt 111 lb (50.349 kg)  BMI 17.52 kg/m2  Weight:  Wt Readings from Last 1 Encounters:  12/29/12 111 lb (50.349 kg)     BMI: Body mass index is 17.52 kg/(m^2). General Appearance: Alert, appropriate appearance for age. No acute distress HEENT: Grossly normal Neck / Thyroid: Supple, no masses, nodes or enlargement Lungs: clear to auscultation bilaterally Back: No CVA tenderness Breast Exam: No masses or nodes.No dimpling, nipple retraction or discharge. Cardiovascular: Regular rate and rhythm. S1, S2, no murmur Gastrointestinal: Soft, non-tender, no masses or organomegaly Pelvic Exam: External genitalia: fat pad loss Urinary system: urethral meatus with mucosal prolapse vs  caruncle Vaginal: atrophic mucosa Cervix: atrophic and stenotic with exocervical contact bleeding Adnexa: non palpable Uterus: normal single, nontender Rectovaginal: normal rectal, no masses Lymphatic Exam: Non-palpable nodes in neck, clavicular, axillary, or inguinal regions Skin: no rash or abnormalities Neurologic: Normal gait and speech, no tremor  Psychiatric: Alert and oriented, appropriate affect.    Urinalysis:Not done    Assessment:    vaginal atrophy  Osteoporosis with improved bmd last dxa Asymptomatic urethral caruncle   Plan:   mammogram pap smear with HR HPV F/u DXA with Dr. Sharl Ma per pt choice return annually or prn   Dierdre Forth MD

## 2012-12-30 NOTE — Addendum Note (Signed)
Addended by: Stephens Shire on: 12/30/2012 10:49 AM   Modules accepted: Orders

## 2013-01-15 ENCOUNTER — Other Ambulatory Visit: Payer: Self-pay

## 2013-02-01 ENCOUNTER — Other Ambulatory Visit: Payer: Self-pay | Admitting: Family Medicine

## 2013-02-01 MED ORDER — AMLODIPINE BESYLATE 5 MG PO TABS: 5.0000 mg | ORAL_TABLET | Freq: Every day | ORAL | Status: DC

## 2013-02-01 NOTE — Progress Notes (Signed)
Pt out of her BP medicine. Has an appt with her pcp in upcoming weeks but is out now. Not having any probs w meds so sI will refill this one time as a courtesy.

## 2013-03-11 ENCOUNTER — Other Ambulatory Visit: Payer: Self-pay | Admitting: Dermatology

## 2013-05-09 ENCOUNTER — Other Ambulatory Visit: Payer: Self-pay

## 2013-05-09 DIAGNOSIS — Z1231 Encounter for screening mammogram for malignant neoplasm of breast: Secondary | ICD-10-CM

## 2013-05-19 ENCOUNTER — Ambulatory Visit (INDEPENDENT_AMBULATORY_CARE_PROVIDER_SITE_OTHER): Payer: 59 | Admitting: Family Medicine

## 2013-05-19 ENCOUNTER — Other Ambulatory Visit: Payer: Self-pay

## 2013-05-19 ENCOUNTER — Other Ambulatory Visit: Payer: Self-pay | Admitting: Family Medicine

## 2013-05-19 ENCOUNTER — Encounter: Payer: Self-pay | Admitting: Family Medicine

## 2013-05-19 ENCOUNTER — Ambulatory Visit (HOSPITAL_COMMUNITY)
Admission: RE | Admit: 2013-05-19 | Discharge: 2013-05-19 | Disposition: A | Discharge status: Home / Self Care | Payer: 59 | Source: Ambulatory Visit | Attending: Family Medicine | Admitting: Family Medicine

## 2013-05-19 DIAGNOSIS — R079 Chest pain, unspecified: Secondary | ICD-10-CM | POA: Insufficient documentation

## 2013-05-19 DIAGNOSIS — I493 Ventricular premature depolarization: Secondary | ICD-10-CM | POA: Insufficient documentation

## 2013-05-19 DIAGNOSIS — I4949 Other premature depolarization: Secondary | ICD-10-CM | POA: Insufficient documentation

## 2013-05-19 DIAGNOSIS — I517 Cardiomegaly: Secondary | ICD-10-CM | POA: Insufficient documentation

## 2013-05-19 NOTE — Progress Notes (Signed)
  Subjective:    Patient ID: Tracey [name], female    DOB: February 15, 1951, 62 y.o.   MRN: [id number]  HPI Skipped heart beats - over the past month, she has noted increasing skipped heart beats without lightheadedness. Two episodes of non-exertional left jaw discomfort with mild diaphoresis without associated chest or left arm symptoms. No viral illness preceding. No definite palpitations.   status post stress thallium by Dr Mendel Ryder in 2000 with no substernal symptoms since.    Review of Systems  Constitutional: Negative for unexpected weight change.  Respiratory: Negative for cough and chest tightness.   Cardiovascular: Negative for chest pain, palpitations and leg swelling.  Gastrointestinal: Negative for diarrhea.  Neurological: Negative for syncope and light-headedness.       Objective:   Physical Exam  Cardiovascular: Normal rate.  Exam reveals no gallop.   No murmur heard. Frequent premature beats On EKG only one PVC seen.   Pulmonary/Chest: Effort normal and breath sounds normal. She has no wheezes. She has no rales.  Musculoskeletal: She exhibits edema.  Neurological: She is alert.          Assessment & Plan:

## 2013-05-19 NOTE — Patient Instructions (Addendum)
We will schedule you with Dr Garnette Scheuermann. Continue your aspiring.   Chest Pain Observation It is often hard to give a specific diagnosis for the cause of chest pain. Your symptoms had a chance of being caused by inadequate oxygen delivery to your heart (angina). Angina that is not treated or evaluated can lead to a heart attack (myocardial infarction, MI) or death. Blood tests, electrocardiograms, and X-rays may have been done to help determine a possible cause of your chest pain. After evaluation and observation, your caregiver has determined that it is unlikely your pain was caused by angina. However, a full evaluation of your pain needs to be completed. You need to follow up with caregivers or diagnostic testing as directed. It is very important to keep your follow-up appointments. Not keeping your follow-up appointments could result in permanent heart damage, disability, or death. If there is any problem keeping your follow-up appointments, you must call your caregiver. HOME CARE INSTRUCTIONS  Due to the slight chance that your pain could be angina, it is important to follow healthy lifestyle habits and follow your caregiver's treatment plan:  Maintain a healthy weight.  Stay physically active and exercise regularly.  Decrease your salt intake.  Eat a diet low in saturated fats and cholesterol. Avoid foods fried in oil or made with fat. Talk to a dietician to learn about heart healthy foods.  Increase your fiber intake by including whole grains, vegetable, and fruits in your diet.  Avoid situations that cause stress, anger, or depression.  Take medication as advised by your caregiver. Report any side effects to your caregiver. Do not stop medications or adjust the dosages on your own.  Quit smoking. Do not use nicotine patches or gum until you check with your caregiver.  Keep your blood pressure, blood sugar, and cholesterol levels within normal limits.  Limit alcohol intake to no more  than 1 drink per day for nonpregnant women and 2 drinks per day for men.  Stop abusing drugs. SEEK MEDICAL CARE IF: You have severe chest pain or pressure which may include symptoms such as:  Pain or pressure in the arms, neck, jaw, or back.  Profuse sweating.  Feeling sick to your stomach (nauseous).  Feeling short of breath while at rest.  Having a fast or irregular heartbeat.  You have chest pain that does not get better after rest or after taking your usual medicine.  You wake from sleep with chest pain.  You feel dizzy, faint, or experience extreme fatigue.  You notice increasing shortness of breath during rest, sleep, or with activity.  You are unable to sleep because you cannot breathe.  You develop a frequent cough or you are coughing up blood.  You have severe back or abdominal pain, are nauseated, or throw up (vomit).  You develop severe weakness, dizziness, fainting, or chills. Any of these symptoms may represent a serious problem that is an emergency. Do not wait to see if the symptoms will go away. Call your local emergency services (911 in the [place] Do not drive yourself to the hospital. MAKE SURE YOU:  Understand these instructions.  Will watch your condition.  Will get help right away if you are not doing well or get worse. Document Released: 12/20/2010 Document Revised: 02/09/2012 Document Reviewed: 12/20/2010 [facility] Patient Information 2014 [place], [place].    Premature Ventricular Contraction Premature ventricular contraction (PVC) is an irregularity of the heart rhythm involving extra or skipped heartbeats. In some cases, they may occur without  obvious cause or heart disease. Other times, they can be caused by an electrolyte change in the blood. These need to be corrected. They can also be seen when there is not enough oxygen going to the heart. A common cause of this is plaque or cholesterol buildup. This buildup decreases the blood supply to the  heart. In addition, extra beats may be caused or aggravated by:  Excessive smoking.  Alcohol consumption.  Caffeine.  Certain medications  Some street drugs. SYMPTOMS   The sensation of feeling your heart skipping a beat (palpitations).  In many cases, the person may have no symptoms. SIGNS AND TESTS   A physical examination may show an occasional irregularity, but if the PVC beats do not happen often, they may not be found on physical exam.  Blood pressure is usually normal.  Other tests that may find extra beats of the heart are:  An EKG (electrocardiogram)  A Holter monitor which can monitor your heart over longer periods of time  An Angiogram (study of the heart arteries). TREATMENT  Usually extra heartbeats do not need treatment. The condition is treated only if symptoms are severe or if extra beats are very frequent or are causing problems. An underlying cause, if discovered, may also require treatment.  Treatment may also be needed if there may be a risk for other more serious cardiac arrhythmias.  PREVENTION   Moderation in caffeine, alcohol, and tobacco use may reduce the risk of ectopic heartbeats in some people.  Exercise often helps people who lead a sedentary (inactive) lifestyle. PROGNOSIS  PVC heartbeats are generally harmless and do not need treatment.  RISKS AND COMPLICATIONS   Ventricular tachycardia (occasionally).  There usually are no complications.  Other arrhythmias (occasionally). SEEK IMMEDIATE MEDICAL CARE IF:   You feel palpitations that are frequent or continual.  You develop chest pain or other problems such as shortness of breath, sweating, or nausea and vomiting.  You become light-headed or faint (pass out).  You get worse or do not improve with treatment. Document Released: 07/04/2004 Document Revised: 02/09/2012 Document Reviewed: 01/14/2008 [facility] Patient Information 2014 [place], [place].

## 2013-05-19 NOTE — Assessment & Plan Note (Addendum)
rule out hypokalemia or hyperthyroidism With jaw discomfort will refer to Dr Mendel Ryder.  Given instructions for when to urgently go to the hospital.

## 2013-05-20 LAB — BASIC METABOLIC PANEL
BUN: 17 mg/dL (ref 6–23)
Chloride: 97 mEq/L (ref 96–112)
Glucose, Bld: 108 mg/dL — ABNORMAL HIGH (ref 70–99)
Potassium: 3.7 mEq/L (ref 3.5–5.3)

## 2013-05-20 LAB — TSH: TSH: 1.716 u[IU]/mL (ref 0.350–4.500)

## 2013-06-13 ENCOUNTER — Ambulatory Visit
Admission: RE | Admit: 2013-06-13 | Discharge: 2013-06-13 | Disposition: A | Discharge status: Home / Self Care | Payer: 59 | Source: Ambulatory Visit

## 2013-06-13 ENCOUNTER — Other Ambulatory Visit (HOSPITAL_COMMUNITY): Payer: Self-pay | Admitting: Interventional Cardiology

## 2013-06-13 DIAGNOSIS — I059 Rheumatic mitral valve disease, unspecified: Secondary | ICD-10-CM

## 2013-06-13 DIAGNOSIS — Z1231 Encounter for screening mammogram for malignant neoplasm of breast: Secondary | ICD-10-CM

## 2013-06-14 ENCOUNTER — Ambulatory Visit (HOSPITAL_COMMUNITY)
Admission: RE | Admit: 2013-06-14 | Discharge: 2013-06-14 | Disposition: A | Discharge status: Home / Self Care | Payer: 59 | Source: Ambulatory Visit | Attending: Interventional Cardiology | Admitting: Interventional Cardiology

## 2013-06-14 DIAGNOSIS — R0602 Shortness of breath: Secondary | ICD-10-CM | POA: Insufficient documentation

## 2013-06-14 DIAGNOSIS — I079 Rheumatic tricuspid valve disease, unspecified: Secondary | ICD-10-CM | POA: Insufficient documentation

## 2013-06-14 DIAGNOSIS — R0609 Other forms of dyspnea: Secondary | ICD-10-CM | POA: Insufficient documentation

## 2013-06-14 DIAGNOSIS — I059 Rheumatic mitral valve disease, unspecified: Secondary | ICD-10-CM

## 2013-06-14 DIAGNOSIS — R0989 Other specified symptoms and signs involving the circulatory and respiratory systems: Secondary | ICD-10-CM | POA: Insufficient documentation

## 2013-06-14 HISTORY — DX: Shortness of breath: R06.02

## 2013-07-15 ENCOUNTER — Encounter (HOSPITAL_COMMUNITY): Payer: 59

## 2013-07-20 ENCOUNTER — Other Ambulatory Visit (HOSPITAL_COMMUNITY): Payer: Self-pay | Admitting: *Deleted

## 2013-07-21 ENCOUNTER — Encounter (HOSPITAL_COMMUNITY)
Admission: RE | Admit: 2013-07-21 | Discharge: 2013-07-21 | Disposition: A | Discharge status: Home / Self Care | Payer: 59 | Source: Ambulatory Visit | Attending: Internal Medicine | Admitting: Internal Medicine

## 2013-07-21 ENCOUNTER — Encounter (HOSPITAL_COMMUNITY): Payer: 59

## 2013-07-21 DIAGNOSIS — M810 Age-related osteoporosis without current pathological fracture: Secondary | ICD-10-CM | POA: Insufficient documentation

## 2013-07-21 MED ORDER — ZOLEDRONIC ACID 5 MG/100ML IV SOLN
5.0000 mg | Freq: Once | INTRAVENOUS | Status: AC
  Administered 2013-07-21: 5 mg via INTRAVENOUS

## 2013-09-09 ENCOUNTER — Other Ambulatory Visit: Payer: Self-pay | Admitting: Dermatology

## 2013-10-06 ENCOUNTER — Other Ambulatory Visit: Payer: Self-pay

## 2013-12-13 ENCOUNTER — Ambulatory Visit (INDEPENDENT_AMBULATORY_CARE_PROVIDER_SITE_OTHER): Payer: 59 | Admitting: Family Medicine

## 2013-12-13 ENCOUNTER — Encounter: Payer: Self-pay | Admitting: Family Medicine

## 2013-12-13 VITALS — BP 116/62 | HR 88 | Temp 98.1°F | Resp 16 | Wt 107.6 lb

## 2013-12-13 DIAGNOSIS — M533 Sacrococcygeal disorders, not elsewhere classified: Secondary | ICD-10-CM

## 2013-12-13 DIAGNOSIS — M9981 Other biomechanical lesions of cervical region: Secondary | ICD-10-CM

## 2013-12-13 DIAGNOSIS — M999 Biomechanical lesion, unspecified: Secondary | ICD-10-CM | POA: Insufficient documentation

## 2013-12-13 NOTE — Progress Notes (Signed)
Patient ID: Tracey [name], female   DOB: 03-15-1951, 63 y.o.   MRN: [id number] Patient is here for manipulation therapy.  Have not seen patient for greater than 1 year.  Patient has been doing very well overall. Patient states that recently she was putting away her Christmas tree and it was heavier than anticipated giving her some pain in the lower back mostly on the right side. Patient denies any radiation. Patient states over the course of time it is getting better slowly but still has discomfort with certain rotational movements. This is not stopping her from any activity and not keeping her up at night. Patient is taking her Capoten 3 times a day. She stated that this seems to help somewhat. Patient did respond for osteopathic manipulation before and would like to see if that could potentially be beneficial again.  Past medical history, social, surgical and family history all reviewed.   Denies fever, chills, nausea vomiting abdominal pain, dysuria, chest pain, shortness of breath dyspnea on exertion or numbness in extremities   Physical exam Blood pressure 116/62, pulse 88, temperature 98.1 F (36.7 C), temperature source Oral, resp. rate 16, weight 107 lb 9.6 oz (48.807 kg), SpO2 97.00%.  General: No apparent distress minorly tearful when discussing friend. HEENT: Pupils equal round to light and accommodation, extraocular movements Respiratory: Patient's speak in full sentences and does not appear short of breath Cardiovascular: No lower extremity edema, non tender, no erythema Skin: Warm dry intact with no signs of infection or rash on extremities or on axial skeleton. Abdomen: Soft nontender Neuro: Cranial nerves II through XII are intact, neurovascularly intact in all extremities with 2+ DTRs and 2+ pulses. Lymph: No lymphadenopathy of posterior or anterior cervical chain or axillae bilaterally.  Gait normal with good balance and coordination.  MSK: Non tender with full range of motion  and good stability and symmetric strength and tone of  elbows, wrist, hip,  and ankles bilaterally.  Back Exam:  Inspection: Unremarkable  Motion: Flexion 45 deg, Extension 45 deg, Side Bending to 45 deg bilaterally,  Rotation to 45 deg bilaterally  SLR laying: Negative  XSLR laying: Negative  Palpable tenderness: None. FABER: Positive right Sensory change: Gross sensation intact to all lumbar and sacral dermatomes.  Reflexes: 2+ at both patellar tendons, 2+ at achilles tendons, Babinski's downgoing.  Strength at foot  Plantar-flexion: 5/5 Dorsi-flexion: 5/5 Eversion: 5/5 Inversion: 5/5  Leg strength  Quad: 5/5 Hamstring: 5/5 Hip flexor: 5/5 Hip abductors: 5/5  Gait unremarkable.   OMT Physical Exam  Standing structural       Occiput left-sided higher      Cervical  C2to extended rotated and side bent left   Thoracic T3-5 neutral position group curve rotated left side bent right  Lumbar 2  flexed rotated and side bent right  Sacrum right on right

## 2013-12-13 NOTE — Assessment & Plan Note (Signed)
Decision today to treat with OMT was based on Physical Exam  After verbal consent patient was treated with HVLA, ME, FPR techniques in cervical, thoracic, lumbar and sacral areas  Patient tolerated the procedure well with improvement in symptoms  Patient given exercises, stretches and lifestyle modifications  See medications in patient instructions if given  Patient will follow up in 4 weeks 

## 2013-12-13 NOTE — Patient Instructions (Signed)
Good to see you Have a great time on the cruise.  Do following exercises 3 times a week.  Sacroiliac Joint Mobilization and Rehab 1. Work on pretzel stretching, shoulder back and leg draped in front. 3-5 sets, 30 sec.. 2. hip abductor rotations. standing, hip flexion and rotation outward then inward. 3 sets, 15 reps. when can do comfortably, add ankle weights starting at 2 pounds.  3. cross over stretching - shoulder back to ground, same side leg crossover. 3-5 sets for 30 min..  4. rolling up and back knees to chest and rocking. 5. sacral tilt - 5 sets, hold for 5-10 seconds  See me when you need me.

## 2013-12-13 NOTE — Assessment & Plan Note (Signed)
Diagnosis, prognosis and rehabilitation discussed with patient. Home exercise program to Patient to follow up in 3-4 weeks for further evaluation and manipulation therapy which patient responded to very well today.

## 2013-12-14 ENCOUNTER — Ambulatory Visit: Payer: 59 | Admitting: Family Medicine

## 2014-02-03 ENCOUNTER — Other Ambulatory Visit: Payer: Self-pay | Admitting: Obstetrics and Gynecology

## 2014-02-16 ENCOUNTER — Ambulatory Visit (INDEPENDENT_AMBULATORY_CARE_PROVIDER_SITE_OTHER): Payer: 59 | Admitting: Family Medicine

## 2014-02-16 ENCOUNTER — Encounter: Payer: Self-pay | Admitting: Family Medicine

## 2014-02-16 VITALS — BP 120/70 | HR 74

## 2014-02-16 DIAGNOSIS — M533 Sacrococcygeal disorders, not elsewhere classified: Secondary | ICD-10-CM

## 2014-02-16 DIAGNOSIS — M999 Biomechanical lesion, unspecified: Secondary | ICD-10-CM

## 2014-02-16 DIAGNOSIS — M9981 Other biomechanical lesions of cervical region: Secondary | ICD-10-CM

## 2014-02-16 MED ORDER — DIAZEPAM 5 MG PO TABS: 5.0000 mg | ORAL_TABLET | Freq: Two times a day (BID) | ORAL | Status: DC | PRN

## 2014-02-16 NOTE — Assessment & Plan Note (Addendum)
Patient did have an acute exacerbation of her sacroiliac dysfunction. Patient did respond to osteopathic manipulation. Patient was given exercises again and showed proper technique. Patient will followup in 3 weeks for further evaluation. Spent greater than 25 minutes with patient face-to-face and had greater than 50% of counseling including as described above in assessment and plan.

## 2014-02-16 NOTE — Assessment & Plan Note (Signed)
Decision today to treat with OMT was based on Physical Exam  After verbal consent patient was treated with HVLA, ME, FPR techniques in cervical, thoracic, lumbar and sacral areas  Patient tolerated the procedure well with improvement in symptoms  Patient given exercises, stretches and lifestyle modifications  See medications in patient instructions if given  Patient will follow up in 3 weeks 

## 2014-02-16 NOTE — Progress Notes (Signed)
Patient ID: Tracey [name], female   DOB: 1951/05/19, 62 y.o.   MRN: [id number] Patient is here for manipulation therapy.  Patient states 4 days ago she was putting in her contact and had pain in her sacral area. Patient is unfortunately been so much pain she's been using her Valium which is graded to be-year-old. Patient states that overall it seems to be getting somewhat better but when she goes from a sitting to standing position she has severe pain over the right SI joint. Denies any radiation of pain or any numbness.  Past medical history, social, surgical and family history all reviewed.   Denies fever, chills, nausea vomiting abdominal pain, dysuria, chest pain, shortness of breath dyspnea on exertion or numbness in extremities   Physical exam Blood pressure 120/70, pulse 74, SpO2 98.00%.  General: No apparent distress minorly tearful when discussing friend. HEENT: Pupils equal round to light and accommodation, extraocular movements Respiratory: Patient's speak in full sentences and does not appear short of breath Cardiovascular: No lower extremity edema, non tender, no erythema Skin: Warm dry intact with no signs of infection or rash on extremities or on axial skeleton. Abdomen: Soft nontender Neuro: Cranial nerves II through XII are intact, neurovascularly intact in all extremities with 2+ DTRs and 2+ pulses. Lymph: No lymphadenopathy of posterior or anterior cervical chain or axillae bilaterally.  Gait normal with good balance and coordination.  MSK: Non tender with full range of motion and good stability and symmetric strength and tone of  elbows, wrist, hip,  and ankles bilaterally.  Back Exam:  Inspection: Unremarkable  Motion: Flexion 45 deg, Extension 45 deg, Side Bending to 45 deg bilaterally,  Rotation to 45 deg bilaterally  SLR laying: Negative  XSLR laying: Negative  Palpable tenderness: None. FABER: Positive right Sensory change: Gross sensation intact to all lumbar  and sacral dermatomes.  Reflexes: 2+ at both patellar tendons, 2+ at achilles tendons, Babinski's downgoing.  Strength at foot  Plantar-flexion: 5/5 Dorsi-flexion: 5/5 Eversion: 5/5 Inversion: 5/5  Leg strength  Quad: 5/5 Hamstring: 5/5 Hip flexor: 5/5 Hip abductors: 5/5  Gait unremarkable.   OMT Physical Exam     Cervical  Neutral   Thoracic T3-5 neutral position group curve rotated left side bent right  Lumbar 2  flexed rotated and side bent right  Sacrum right on right

## 2014-02-16 NOTE — Patient Instructions (Signed)
Always good to see you Tell everyone hello Sacroiliac Joint Mobilization and Rehab 1. Work on pretzel stretching, shoulder back and leg draped in front. 3-5 sets, 30 sec.. 2. hip abductor rotations. standing, hip flexion and rotation outward then inward. 3 sets, 15 reps. when can do comfortably, add ankle weights starting at 2 pounds.  3. cross over stretching - shoulder back to ground, same side leg crossover. 3-5 sets for 30 min..  4. rolling up and back knees to chest and rocking. 5. sacral tilt - 5 sets, hold for 5-10 seconds  see me in 3 weeks.

## 2014-03-13 ENCOUNTER — Encounter (HOSPITAL_COMMUNITY): Payer: Self-pay | Admitting: *Deleted

## 2014-03-15 ENCOUNTER — Ambulatory Visit (INDEPENDENT_AMBULATORY_CARE_PROVIDER_SITE_OTHER): Payer: 59 | Admitting: Family Medicine

## 2014-03-15 ENCOUNTER — Encounter: Payer: Self-pay | Admitting: Family Medicine

## 2014-03-15 ENCOUNTER — Encounter (HOSPITAL_COMMUNITY): Payer: Self-pay | Admitting: Pharmacy Technician

## 2014-03-15 VITALS — BP 110/70 | HR 94

## 2014-03-15 DIAGNOSIS — M533 Sacrococcygeal disorders, not elsewhere classified: Secondary | ICD-10-CM

## 2014-03-15 DIAGNOSIS — M999 Biomechanical lesion, unspecified: Secondary | ICD-10-CM

## 2014-03-15 NOTE — Assessment & Plan Note (Signed)
Decision today to treat with OMT was based on Physical Exam  After verbal consent patient was treated with HVLA, ME, FPR techniques in  Thoracic, rib lumbar and sacral areas  Patient tolerated the procedure well with improvement in symptoms  Patient given exercises, stretches and lifestyle modifications  See medications in patient instructions if given  Patient will follow up in 4-6 weeks

## 2014-03-15 NOTE — Progress Notes (Signed)
Patient ID: Tracey [name], female   DOB: 08/23/1951, 62 y.o.   MRN: [id number] Patient is here for manipulation therapy.  [place] as well as doing wiring and stated that she started having more discomfort in the thoracic spine as well as the lower right side sacroiliac joint. Patient denies any radiation to the extremities but states that the pain does not seem to be going away in considers unrelenting. Patient has not been doing exercises secondary to the pain and it has made things difficult today. Patient is also found it difficult to do her regular activities such as her job. Denies fevers chills or any abnormal weight loss.  Past medical history, social, surgical and family history all reviewed.   Denies fever, chills, nausea vomiting abdominal pain, dysuria, chest pain, shortness of breath dyspnea on exertion or numbness in extremities   Physical exam Blood pressure 110/70, pulse 94, SpO2 99.00%.  General: No apparent distress minorly tearful when discussing friend. HEENT: Pupils equal round to light and accommodation, extraocular movements Respiratory: Patient's speak in full sentences and does not appear short of breath Cardiovascular: No lower extremity edema, non tender, no erythema Skin: Warm dry intact with no signs of infection or rash on extremities or on axial skeleton. Abdomen: Soft nontender Neuro: Cranial nerves II through XII are intact, neurovascularly intact in all extremities with 2+ DTRs and 2+ pulses. Lymph: No lymphadenopathy of posterior or anterior cervical chain or axillae bilaterally.  Gait normal with good balance and coordination.  MSK: Non tender with full range of motion and good stability and symmetric strength and tone of  elbows, wrist, hip,  and ankles bilaterally.  Back Exam:  Inspection: Unremarkable  Motion: Flexion 45 deg, Extension 45 deg, Side Bending to 45 deg bilaterally,  Rotation to 45 deg  bilaterally  SLR laying: Negative  XSLR laying: Negative  Palpable tenderness: None. FABER: Positive right Sensory change: Gross sensation intact to all lumbar and sacral dermatomes.  Reflexes: 2+ at both patellar tendons, 2+ at achilles tendons, Babinski's downgoing.  Strength at foot  Plantar-flexion: 5/5 Dorsi-flexion: 5/5 Eversion: 5/5 Inversion: 5/5  Leg strength  Quad: 5/5 Hamstring: 5/5 Hip flexor: 5/5 Hip abductors: 5/5  Gait unremarkable.   OMT Physical Exam Cervical  Neutral   Thoracic T3-5 neutral position group curve rotated left side bent right Inhaled rib T5 on the right  Lumbar 2  flexed rotated and side bent right  Sacrum right on right  

## 2014-03-15 NOTE — Assessment & Plan Note (Signed)
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 We discussed proper lifting techniques and trying to be square whatever she is lifting. We discussed rotational and lifting his foot causing this discomfort. Patient also given a home exercises today that I think would be beneficial in a handout formation. Patient will come back again in 4-6 weeks for further evaluation and likely manipulation.

## 2014-03-15 NOTE — Patient Instructions (Signed)
Good to see you as always.  Try to lift anything with both hands.  Try not to twist too much.  Consider turmeric 500mg  twice daily to help with aches and pains Continue other exercises.  Come back in 4-6 weeks.

## 2014-03-20 ENCOUNTER — Other Ambulatory Visit: Payer: Self-pay | Admitting: Gastroenterology

## 2014-03-20 NOTE — Addendum Note (Signed)
Addended by: Arta Silence on: 03/20/2014 05:26 PM   Modules accepted: Orders

## 2014-03-22 ENCOUNTER — Encounter (HOSPITAL_COMMUNITY): Payer: 59 | Admitting: Anesthesiology

## 2014-03-22 ENCOUNTER — Encounter (HOSPITAL_COMMUNITY)
Admission: RE | Disposition: A | Discharge status: Home / Self Care | Payer: Self-pay | Source: Ambulatory Visit | Attending: Gastroenterology

## 2014-03-22 ENCOUNTER — Ambulatory Visit (HOSPITAL_COMMUNITY): Payer: 59 | Admitting: Anesthesiology

## 2014-03-22 ENCOUNTER — Encounter (HOSPITAL_COMMUNITY): Payer: Self-pay

## 2014-03-22 ENCOUNTER — Ambulatory Visit (HOSPITAL_COMMUNITY)
Admission: RE | Admit: 2014-03-22 | Discharge: 2014-03-22 | Disposition: A | Discharge status: Home / Self Care | Payer: 59 | Source: Ambulatory Visit | Attending: Gastroenterology | Admitting: Gastroenterology

## 2014-03-22 DIAGNOSIS — Z1211 Encounter for screening for malignant neoplasm of colon: Secondary | ICD-10-CM | POA: Insufficient documentation

## 2014-03-22 DIAGNOSIS — D126 Benign neoplasm of colon, unspecified: Secondary | ICD-10-CM | POA: Insufficient documentation

## 2014-03-22 DIAGNOSIS — I10 Essential (primary) hypertension: Secondary | ICD-10-CM | POA: Insufficient documentation

## 2014-03-22 DIAGNOSIS — Z800 Family history of malignant neoplasm of digestive organs: Secondary | ICD-10-CM | POA: Insufficient documentation

## 2014-03-22 DIAGNOSIS — K5730 Diverticulosis of large intestine without perforation or abscess without bleeding: Secondary | ICD-10-CM | POA: Insufficient documentation

## 2014-03-22 DIAGNOSIS — F411 Generalized anxiety disorder: Secondary | ICD-10-CM | POA: Insufficient documentation

## 2014-03-22 DIAGNOSIS — K219 Gastro-esophageal reflux disease without esophagitis: Secondary | ICD-10-CM | POA: Insufficient documentation

## 2014-03-22 DIAGNOSIS — Z79899 Other long term (current) drug therapy: Secondary | ICD-10-CM | POA: Insufficient documentation

## 2014-03-22 HISTORY — PX: COLONOSCOPY WITH PROPOFOL: SHX5780

## 2014-03-22 SURGERY — COLONOSCOPY WITH PROPOFOL
Anesthesia: Monitor Anesthesia Care

## 2014-03-22 MED ORDER — METOCLOPRAMIDE HCL 5 MG/ML IJ SOLN
INTRAMUSCULAR | Status: AC
  Filled 2014-03-22: qty 2

## 2014-03-22 MED ORDER — PROPOFOL INFUSION 10 MG/ML OPTIME
INTRAVENOUS | Status: DC | PRN
  Administered 2014-03-22: 75 ug/kg/min via INTRAVENOUS

## 2014-03-22 MED ORDER — DEXAMETHASONE SODIUM PHOSPHATE 10 MG/ML IJ SOLN
INTRAMUSCULAR | Status: AC
  Filled 2014-03-22: qty 1

## 2014-03-22 MED ORDER — GLYCOPYRROLATE 0.2 MG/ML IJ SOLN
INTRAMUSCULAR | Status: DC | PRN
  Administered 2014-03-22 (×2): .02 mg via INTRAVENOUS

## 2014-03-22 MED ORDER — SODIUM CHLORIDE 0.9 % IV SOLN: INTRAVENOUS | Status: DC

## 2014-03-22 MED ORDER — LIDOCAINE HCL (CARDIAC) 20 MG/ML IV SOLN
INTRAVENOUS | Status: DC | PRN
  Administered 2014-03-22 (×2): 40 mg via INTRAVENOUS

## 2014-03-22 MED ORDER — DEXAMETHASONE SODIUM PHOSPHATE 10 MG/ML IJ SOLN
INTRAMUSCULAR | Status: DC | PRN
  Administered 2014-03-22: 10 mg via INTRAVENOUS

## 2014-03-22 MED ORDER — METOCLOPRAMIDE HCL 5 MG/ML IJ SOLN
INTRAMUSCULAR | Status: DC | PRN
  Administered 2014-03-22: 10 mg via INTRAVENOUS

## 2014-03-22 MED ORDER — ONDANSETRON HCL 4 MG/2ML IJ SOLN
INTRAMUSCULAR | Status: AC
  Filled 2014-03-22: qty 2

## 2014-03-22 MED ORDER — KETAMINE HCL 10 MG/ML IJ SOLN
INTRAMUSCULAR | Status: DC | PRN
  Administered 2014-03-22: 20 mg via INTRAVENOUS

## 2014-03-22 MED ORDER — PROPOFOL 10 MG/ML IV BOLUS
INTRAVENOUS | Status: AC
  Filled 2014-03-22: qty 20

## 2014-03-22 MED ORDER — ONDANSETRON HCL 4 MG/2ML IJ SOLN
INTRAMUSCULAR | Status: DC | PRN
  Administered 2014-03-22 (×2): 2 mg via INTRAVENOUS

## 2014-03-22 MED ORDER — PROPOFOL 10 MG/ML IV BOLUS
INTRAVENOUS | Status: DC | PRN
  Administered 2014-03-22: 50 mg via INTRAVENOUS

## 2014-03-22 MED ORDER — LACTATED RINGERS IV SOLN
INTRAVENOUS | Status: DC
  Administered 2014-03-22: 08:00:00 via INTRAVENOUS

## 2014-03-22 MED ORDER — LIDOCAINE HCL (CARDIAC) 20 MG/ML IV SOLN
INTRAVENOUS | Status: AC
  Filled 2014-03-22: qty 5

## 2014-03-22 SURGICAL SUPPLY — 21 items

## 2014-03-22 NOTE — Anesthesia Preprocedure Evaluation (Signed)
Anesthesia Evaluation  Patient identified by MRN, date of birth, ID band Patient awake    Reviewed: Allergy & Precautions, H&P , NPO status , Patient's Chart, lab work & pertinent test results  Airway Mallampati: I TM Distance: >3 FB Neck ROM: Full    Dental  (+) Dental Advisory Given   Pulmonary shortness of breath,  breath sounds clear to auscultation        Cardiovascular hypertension, Pt. on medications Rhythm:Regular Rate:Normal     Neuro/Psych PSYCHIATRIC DISORDERS Anxiety negative neurological ROS     GI/Hepatic Neg liver ROS, GERD-  ,  Endo/Other  negative endocrine ROS  Renal/GU negative Renal ROS     Musculoskeletal negative musculoskeletal ROS (+)   Abdominal   Peds  Hematology negative hematology ROS (+)   Anesthesia Other Findings   Reproductive/Obstetrics negative OB ROS                           Anesthesia Physical Anesthesia Plan  ASA: II  Anesthesia Plan: MAC   Post-op Pain Management:    Induction: Intravenous  Airway Management Planned:   Additional Equipment:   Intra-op Plan:   Post-operative Plan:   Informed Consent: I have reviewed the patients History and Physical, chart, labs and discussed the procedure including the risks, benefits and alternatives for the proposed anesthesia with the patient or authorized representative who has indicated his/her understanding and acceptance.   Dental advisory given  Plan Discussed with: CRNA  Anesthesia Plan Comments:         Anesthesia Quick Evaluation

## 2014-03-22 NOTE — H&P (View-Only) (Signed)
Patient ID: Tracey [name], female   DOB: [birth year], 63 y.o.   MRN: [id number] Patient is here for manipulation therapy.  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 Patient denies any radiation to the extremities but states that the pain does not seem to be going away in considers unrelenting. Patient has not been doing exercises secondary to the pain and it has made things difficult today. Patient is also found it difficult to do her regular activities such as her job. Denies fevers chills or any abnormal weight loss.  Past medical history, social, surgical and family history all reviewed.   Denies fever, chills, nausea vomiting abdominal pain, dysuria, chest pain, shortness of breath dyspnea on exertion or numbness in extremities   Physical exam Blood pressure 110/70, pulse 94, SpO2 99.00%.  General: No apparent distress minorly tearful when discussing friend. HEENT: Pupils equal round to light and accommodation, extraocular movements Respiratory: Patient's speak in full sentences and does not appear short of breath Cardiovascular: No lower extremity edema, non tender, no erythema Skin: Warm dry intact with no signs of infection or rash on extremities or on axial skeleton. Abdomen: Soft nontender Neuro: Cranial nerves II through XII are intact, neurovascularly intact in all extremities with 2+ DTRs and 2+ pulses. Lymph: No lymphadenopathy of posterior or anterior cervical chain or axillae bilaterally.  Gait normal with good balance and coordination.  MSK: Non tender with full range of motion and good stability and symmetric strength and tone of  elbows, wrist, hip,  and ankles bilaterally.  Back Exam:  Inspection: Unremarkable  Motion: Flexion 45 deg, Extension 45 deg, Side Bending to 45 deg bilaterally,  Rotation to 45 deg  bilaterally  SLR laying: Negative  XSLR laying: Negative  Palpable tenderness: None. FABER: Positive right Sensory change: Gross sensation intact to all lumbar and sacral dermatomes.  Reflexes: 2+ at both patellar tendons, 2+ at achilles tendons, Babinski's downgoing.  Strength at foot  Plantar-flexion: 5/5 Dorsi-flexion: 5/5 Eversion: 5/5 Inversion: 5/5  Leg strength  Quad: 5/5 Hamstring: 5/5 Hip flexor: 5/5 Hip abductors: 5/5  Gait unremarkable.   OMT Physical Exam Cervical  Neutral   Thoracic T3-5 neutral position group curve rotated left side bent right Inhaled rib T5 on the right  Lumbar 2  flexed rotated and side bent right  Sacrum right on right

## 2014-03-22 NOTE — Discharge Instructions (Signed)
Colonoscopy ° °Post procedure instructions: ° °Read the instructions outlined below and refer to this sheet in the next few weeks. These discharge instructions provide you with general information on caring for yourself after you leave the hospital. Your doctor may also give you specific instructions. While your treatment has been planned according to the most current medical practices available, unavoidable complications occasionally occur. If you have any problems or questions after discharge, call Dr. Mirranda Monrroy at [facility] ([phone]). ° °HOME CARE INSTRUCTIONS ° °ACTIVITY: °· You may resume your regular activity, but move at a slower pace for the next 24 hours.  °· Take frequent rest periods for the next 24 hours.  °· Walking will help get rid of the air and reduce the bloated feeling in your belly (abdomen).  °· No driving for 24 hours (because of the medicine (anesthesia) used during the test).  °· You may shower.  °· Do not sign any important legal documents or operate any machinery for 24 hours (because of the anesthesia used during the test).  °NUTRITION: °· Drink plenty of fluids.  °· You may resume your normal diet as instructed by your doctor.  °· Begin with a light meal and progress to your normal diet. Heavy or fried foods are harder to digest and may make you feel sick to your stomach (nauseated).  °· Avoid alcoholic beverages for 24 hours or as instructed.  °MEDICATIONS: °· You may resume your normal medications unless your doctor tells you otherwise.  °WHAT TO EXPECT TODAY: °· Some feelings of bloating in the abdomen.  °· Passage of more gas than usual.  °· Spotting of blood in your stool or on the toilet paper.  °IF YOU HAD POLYPS REMOVED DURING THE COLONOSCOPY: °· No aspirin products for 7 days or as instructed.  °· No alcohol for 7 days or as instructed.  °· Eat a soft diet for the next 24 hours.  ° °FINDING OUT THE RESULTS OF YOUR TEST ° °Not all test results are available during your  visit. If your test results are not back during the visit, make an appointment with your caregiver to find out the results. Do not assume everything is normal if you have not heard from your caregiver or the medical facility. It is important for you to follow up on all of your test results.  ° ° ° °SEEK IMMEDIATE MEDICAL CARE IF: ° °· You have more than a spotting of blood in your stool.  °· Your belly is swollen (abdominal distention).  °· You are nauseated or vomiting.  °· You have a fever.  °· You have abdominal pain or discomfort that is severe or gets worse throughout the day.  ° ° °Document Released: 07/01/2004 Document Revised: 07/30/2011 Document Reviewed: 06/29/2008 °Exit[employer]® Patient Information ©2012 [employer]. ° °

## 2014-03-22 NOTE — Op Note (Signed)
[facility] [place], [address]   COLONOSCOPY PROCEDURE REPORT  PATIENT: Tracey [name], Tracey A.  MR#: [id number] BIRTHDATE: 15-Jan-1951 , 16  yrs. old GENDER: Female ENDOSCOPIST: Arta Silence, MD REFERRED BY: Cloyd Stagers, MD PROCEDURE DATE:  03/22/2014 PROCEDURE:   Colonoscopy with cold biopsy polypectomy ASA CLASS:   Class II INDICATIONS:family history colon polyps. MEDICATIONS: MAC sedation, administered by CRNA  DESCRIPTION OF PROCEDURE:   After the risks benefits and alternatives of the procedure were thoroughly explained, informed consent was obtained.  A digital rectal exam revealed no abnormalities of the rectum.   The Pentax Ped Colon [id number] endoscope was introduced through the anus and advanced to the cecum, which was identified by both the appendix and ileocecal valve. No adverse events experienced.   The quality of the prep was good.       The instrument was then slowly withdrawn as the colon was fully examined.     Findings:  Normal digital rectal exam.  Prep quality was good.  Few medium-sized sigmoid diverticula.  29mm transverse colon polyp, removed with cold biopsy forceps.  No other polyps, masses, vascular ectasias, or inflammatory changes were seen.   Retroflexed view of rectum was normal.       Withdrawal time was   .  The scope was withdrawn and the procedure completed.  ENDOSCOPIC IMPRESSION:     As above.  RECOMMENDATIONS:     1.  Watch for potential complications of procedure. 2.  Await polypectomy results. 3.  Repeat colonoscopy in 5 years. 4.  Follow-up with [facility] on as-needed basis.  eSigned:  Arta Silence, MD 03/22/2014 9:10 AM   cc:

## 2014-03-22 NOTE — Interval H&P Note (Signed)
History and Physical Interval Note:  03/22/2014 8:36 AM  Tracey [name]  has presented today for surgery, with the diagnosis of fm.hx of colon polyps  The various methods of treatment have been discussed with the patient and family. After consideration of risks, benefits and other options for treatment, the patient has consented to  Procedure(s): COLONOSCOPY WITH PROPOFOL (N/A) as a surgical intervention .  The patient's history has been reviewed, patient examined, no change in status, stable for surgery.  I have reviewed the patient's chart and labs.  Questions were answered to the patient's satisfaction.     Arta Silence  Assessment:  1.  Family history colon polyps, brother.  Plan:  1.  Screening colonoscopy. 2.  Risks (bleeding, infection, bowel perforation that could require surgery, sedation-related changes in cardiopulmonary systems), benefits (identification and possible treatment of source of symptoms, exclusion of certain causes of symptoms), and alternatives (watchful waiting, radiographic imaging studies, empiric medical treatment) of colonoscopy were explained to patient/family in detail and patient wishes to proceed.

## 2014-03-22 NOTE — Anesthesia Postprocedure Evaluation (Signed)
Anesthesia Post Note  Patient: Tracey [name]  Procedure(s) Performed: Procedure(s) (LRB): COLONOSCOPY WITH PROPOFOL (N/A)  Anesthesia type: MAC  Patient location: PACU  Post pain: Pain level controlled  Post assessment: Post-op Vital signs reviewed  Last Vitals: BP 142/94  Pulse 75  Temp(Src) 36.3 C (Oral)  Resp 16  Ht 5\' 7"  (1.702 m)  Wt 107 lb (48.535 kg)  BMI 16.75 kg/m2  SpO2 97%  Post vital signs: Reviewed  Level of consciousness: awake  Complications: No apparent anesthesia complications

## 2014-03-22 NOTE — Transfer of Care (Signed)
Immediate Anesthesia Transfer of Care Note  Patient: Tracey [name]  Procedure(s) Performed: Procedure(s): COLONOSCOPY WITH PROPOFOL (N/A)  Patient Location: Endo Recovery  Anesthesia Type:MAC  Level of Consciousness: Patient easily awoken, sedated, comfortable, cooperative, following commands, responds to stimulation.   Airway & Oxygen Therapy: Patient spontaneously breathing, ventilating well, oxygen via simple oxygen mask.  Post-op Assessment: Report given to PACU RN, vital signs reviewed and stable, moving all extremities.   Post vital signs: Reviewed and stable.  Complications: No apparent anesthesia complications

## 2014-03-23 ENCOUNTER — Encounter (HOSPITAL_COMMUNITY): Payer: Self-pay | Admitting: Gastroenterology

## 2014-03-24 ENCOUNTER — Other Ambulatory Visit: Payer: Self-pay | Admitting: Dermatology

## 2014-05-08 ENCOUNTER — Encounter: Payer: Self-pay | Admitting: Family Medicine

## 2014-05-08 ENCOUNTER — Ambulatory Visit (INDEPENDENT_AMBULATORY_CARE_PROVIDER_SITE_OTHER): Payer: 59 | Admitting: Family Medicine

## 2014-05-08 VITALS — BP 144/80 | Ht 67.0 in | Wt 108.0 lb

## 2014-05-08 DIAGNOSIS — S93509A Unspecified sprain of unspecified toe(s), initial encounter: Secondary | ICD-10-CM

## 2014-05-08 DIAGNOSIS — S93609A Unspecified sprain of unspecified foot, initial encounter: Secondary | ICD-10-CM

## 2014-05-09 DIAGNOSIS — S93509A Unspecified sprain of unspecified toe(s), initial encounter: Secondary | ICD-10-CM | POA: Insufficient documentation

## 2014-05-09 NOTE — Progress Notes (Signed)
Patient ID: Tracey [name], female   DOB: January 25, 1951, 63 y.o.   MRN: [id number]  Tracey [name] - 63 y.o. female MRN [id number]  Date of birth: [birth year]    SUBJECTIVE:     Right great toe pain that started after she had a fall on June 6. She was stepping onto her buttocks when she accidentally stepped into a seat well and had been left open. She fell forward striking her right knee, her chest and having acute onset right toe pain. The knee has been somewhat swollen and bruised and she's been icing that it seems to get better. The right toe is painful when she's walking. At rest it seems to throb and aches some but most of the pain is while walking ROS:     No fever, sweats, chills.   PERTINENT  PMH / PSH FH / / SH:  Past Medical, Surgical, Social, and Family History Reviewed & Updated per EMR.  Pertinent Historical Findings include:  No prior history of foot or toe surgery or injury on the right. Nonsmoker. No personal history diabetes mellitus.  OBJECTIVE: BP 144/80  Ht 5\' 7"  (1.702 m)  Wt 108 lb (48.988 kg)  BMI 16.91 kg/m2  Physical Exam:  Vital signs are reviewed. GENERAL: Well-developed female no acute distress KNEE: Right.  area over the fat pad with small skin contusion. Knee extension is intact and painless. The fat pad area is mildly tender to palpation. TOE: Right. Right great toe has tenderness to palpation along the flexor tendon but there is no defect. The joint itself is nontender to palpation, there is no erythema or ecchymoses or swelling noted. She has intact neurovascular status in the foot and toe. ULTRASOUND: The flexor tendon of the right great toe is seen easily on ultrasound. There is a small area distal to the IP joint where there is some disarray of the fibers, probably 10% of fibers. The rest of the tendon and attachment are seen in entirety and are intact. Dynamic testing under ultrasound reveals normal motion area she has some osteophytes at the IP joint as well as  at the first MTP joint. I see no sign of fracture, there is no joint effusion noted.  ASSESSMENT & PLAN:  See problem based charting & AVS for pt instructions.

## 2014-05-09 NOTE — Assessment & Plan Note (Signed)
Flexor tendon sprain with mild disarray of some fibers seen on ultrasound. We placed a shank in her shoe and recommended she wear that for the next week to 10 days. Gradually weaning out of it. Ice as needed. She can followup if not improving or she has worsening symptoms.

## 2014-06-28 ENCOUNTER — Other Ambulatory Visit: Payer: Self-pay

## 2014-06-28 DIAGNOSIS — Z1231 Encounter for screening mammogram for malignant neoplasm of breast: Secondary | ICD-10-CM

## 2014-06-29 ENCOUNTER — Ambulatory Visit
Admission: RE | Admit: 2014-06-29 | Discharge: 2014-06-29 | Disposition: A | Discharge status: Home / Self Care | Payer: 59 | Source: Ambulatory Visit

## 2014-06-29 DIAGNOSIS — Z1231 Encounter for screening mammogram for malignant neoplasm of breast: Secondary | ICD-10-CM

## 2014-07-21 ENCOUNTER — Other Ambulatory Visit (HOSPITAL_COMMUNITY): Payer: Self-pay | Admitting: *Deleted

## 2014-07-24 ENCOUNTER — Ambulatory Visit (HOSPITAL_COMMUNITY)
Admission: RE | Admit: 2014-07-24 | Discharge: 2014-07-24 | Disposition: A | Discharge status: Home / Self Care | Payer: 59 | Source: Ambulatory Visit | Attending: Internal Medicine | Admitting: Internal Medicine

## 2014-07-24 DIAGNOSIS — M810 Age-related osteoporosis without current pathological fracture: Secondary | ICD-10-CM | POA: Diagnosis not present

## 2014-07-24 MED ORDER — ZOLEDRONIC ACID 5 MG/100ML IV SOLN
INTRAVENOUS | Status: AC
  Filled 2014-07-24: qty 100

## 2014-07-24 MED ORDER — ZOLEDRONIC ACID 5 MG/100ML IV SOLN
5.0000 mg | Freq: Once | INTRAVENOUS | Status: AC
  Administered 2014-07-24: 5 mg via INTRAVENOUS

## 2014-09-25 ENCOUNTER — Encounter: Payer: Self-pay | Admitting: Family Medicine

## 2014-09-25 ENCOUNTER — Ambulatory Visit (INDEPENDENT_AMBULATORY_CARE_PROVIDER_SITE_OTHER): Payer: 59 | Admitting: Family Medicine

## 2014-09-25 VITALS — BP 122/82 | HR 86 | Ht 65.0 in | Wt 104.0 lb

## 2014-09-25 DIAGNOSIS — M999 Biomechanical lesion, unspecified: Secondary | ICD-10-CM

## 2014-09-25 DIAGNOSIS — M9903 Segmental and somatic dysfunction of lumbar region: Secondary | ICD-10-CM

## 2014-09-25 DIAGNOSIS — M9904 Segmental and somatic dysfunction of sacral region: Secondary | ICD-10-CM

## 2014-09-25 DIAGNOSIS — M533 Sacrococcygeal disorders, not elsewhere classified: Secondary | ICD-10-CM

## 2014-09-25 DIAGNOSIS — M9902 Segmental and somatic dysfunction of thoracic region: Secondary | ICD-10-CM

## 2014-09-25 DIAGNOSIS — M62838 Other muscle spasm: Secondary | ICD-10-CM

## 2014-09-25 DIAGNOSIS — M6248 Contracture of muscle, other site: Secondary | ICD-10-CM

## 2014-09-25 MED ORDER — KETOROLAC TROMETHAMINE 60 MG/2ML IM SOLN
60.0000 mg | Freq: Once | INTRAMUSCULAR | Status: AC
  Administered 2014-09-25: 60 mg via INTRAMUSCULAR

## 2014-09-25 NOTE — Patient Instructions (Signed)
Good to see you as always. Tell everyone hello.  Ice 20 minutest after activity and before bed.  Injection today Exercises nightly for the next week.  Valium can help if you need it Try the pennsaid twice daily Tennis ball in back left pocket with sitting can help.  Call me wednesday if not better.

## 2014-09-25 NOTE — Assessment & Plan Note (Signed)
I believe the patient is having an exacerbation of her underlying problem as well as a hip flexor tightness that is contributing secondary to the repetitive motions that usually occur with yard work. We discussed icing regimen and patient was given a shot of Toradol today. In addition this patient does have a muscle relaxer at home that she can take as needed and discussed short course of anti-inflammatories. Patient will be started on home exercises and will follow-up in 1-2 weeks. Patient does not make any considerable improvement we need to consider further imaging including x-rays.

## 2014-09-25 NOTE — Progress Notes (Signed)
Patient ID: Tracey [name], female   DOB: [birth year], 63 y.o.   MRN: [id number] Patient is here for manipulation therapy.  Patient was last seen greater than 6 months ago. Patient was doing very well but this weekend was doing a significant amount yard work. Yesterday patient was barely able to do any of her activities of daily living. Patient states that there was some soreness mostly on the left lumbar spine. Patient states  seem to radiate down the posterior aspect of her leg. Denies any weakness or numbness. Patient states it is very sore. Has not taken any medications for it. States that it was difficult to find a comfortable. To follow sleep. Patient is able to ambulate well. States that when she tries to flex down his when she has severe sharp pain. Rates the pain as 7 out of 10.  Past medical history, social, surgical and family history all reviewed.   Denies fever, chills, nausea vomiting abdominal pain, dysuria, chest pain, shortness of breath dyspnea on exertion or numbness in extremities   Physical exam Blood pressure 122/82, pulse 86, height 5\' 5"  (1.651 m), weight 104 lb (47.174 kg), SpO2 99.00%.  General: No apparent distress minorly tearful when discussing friend. HEENT: Pupils equal round to light and accommodation, extraocular movements Respiratory: Patient's speak in full sentences and does not appear short of breath Cardiovascular: No lower extremity edema, non tender, no erythema Skin: Warm dry intact with no signs of infection or rash on extremities or on axial skeleton. Abdomen: Soft nontender Neuro: Cranial nerves II through XII are intact, neurovascularly intact in all extremities with 2+ DTRs and 2+ pulses. Lymph: No lymphadenopathy of posterior or anterior cervical chain or axillae bilaterally.  Gait normal with good balance and coordination.  MSK: Non tender with full range of motion and good stability and symmetric strength and tone of  elbows, wrist, hip,  and ankles  bilaterally.  Back Exam:  Inspection: Unremarkable  Motion: Flexion 45 deg, Extension 45 deg, Side Bending to 45 deg bilaterally,  Rotation to 45 deg bilaterally  SLR laying: Negative  XSLR laying: Negative  Palpable tenderness: Tender to palpation in the paraspinal musculature of the lumbar spine as well as the right SI joint FABER: Positive right Sensory change: Gross sensation intact to all lumbar and sacral dermatomes.  Reflexes: 2+ at both patellar tendons, 2+ at achilles tendons, Babinski's downgoing.  Strength at foot  Plantar-flexion: 5/5 Dorsi-flexion: 5/5 Eversion: 5/5 Inversion: 5/5  Leg strength  Quad: 5/5 Hamstring: 5/5 Hip flexor: 5/5 Hip abductors: 5/5  Gait unremarkable.   OMT Physical Exam Cervical  Neutral   Thoracic T3-5 neutral position group curve rotated left side bent right T7 E RS left  Lumbar L 2  flexed rotated and side bent right  Sacrum right on right

## 2014-09-25 NOTE — Assessment & Plan Note (Signed)
Decision today to treat with OMT was based on Physical Exam  After verbal consent patient was treated with HVLA, ME, FPR techniques in  Thoracic, rib lumbar and sacral areas  Patient tolerated the procedure well with improvement in symptoms  Patient given exercises, stretches and lifestyle modifications  See medications in patient instructions if given  Patient will follow up in 1-2 weeks

## 2014-10-02 ENCOUNTER — Encounter: Payer: Self-pay | Admitting: Family Medicine

## 2014-10-02 ENCOUNTER — Ambulatory Visit (INDEPENDENT_AMBULATORY_CARE_PROVIDER_SITE_OTHER): Payer: 59 | Admitting: Family Medicine

## 2014-10-02 VITALS — BP 120/84 | HR 85 | Ht 65.0 in | Wt 106.0 lb

## 2014-10-02 DIAGNOSIS — M9904 Segmental and somatic dysfunction of sacral region: Secondary | ICD-10-CM

## 2014-10-02 DIAGNOSIS — M533 Sacrococcygeal disorders, not elsewhere classified: Secondary | ICD-10-CM

## 2014-10-02 DIAGNOSIS — M9901 Segmental and somatic dysfunction of cervical region: Secondary | ICD-10-CM

## 2014-10-02 DIAGNOSIS — M9903 Segmental and somatic dysfunction of lumbar region: Secondary | ICD-10-CM

## 2014-10-02 DIAGNOSIS — M999 Biomechanical lesion, unspecified: Secondary | ICD-10-CM

## 2014-10-02 NOTE — Patient Instructions (Signed)
Good to see you Ice is your friend Continue the stretches especially after yard work.  You know where I am if you need me.

## 2014-10-02 NOTE — Assessment & Plan Note (Signed)
Decision today to treat with OMT was based on Physical Exam  After verbal consent patient was treated with HVLA, ME, FPR techniques in Cervical, thoracic,  lumbar and sacral areas  Patient tolerated the procedure well with improvement in symptoms  Patient given exercises, stretches and lifestyle modifications  See medications in patient instructions if given  Patient will follow up in 4-6 weeks                          

## 2014-10-02 NOTE — Progress Notes (Signed)
Patient ID: Tracey [name], female   DOB: 1951-01-27, 63 y.o.   MRN: [id number] Patient is here for manipulation therapy.  Patient was seen last week for an exacerbation of her low back pain. Patient did have manipulation therapy and was given medications. Patient states that she felt better within 48 hours. Patient was very active this weekend doing yard work as well as working at her church. Patient states that she had a dull nagging pain that seems to be able to still do all activities of daily living. Not taking any medication the last couple days. Overall feels significantly better.  Past medical history, social, surgical and family history all reviewed.   Denies fever, chills, nausea vomiting abdominal pain, dysuria, chest pain, shortness of breath dyspnea on exertion or numbness in extremities   Physical exam Blood pressure 120/84, pulse 85, height 5\' 5"  (1.651 m), weight 106 lb (48.081 kg), SpO2 99 %.  General: No apparent distress minorly tearful when discussing friend. HEENT: Pupils equal round to light and accommodation, extraocular movements Respiratory: Patient's speak in full sentences and does not appear short of breath Cardiovascular: No lower extremity edema, non tender, no erythema Skin: Warm dry intact with no signs of infection or rash on extremities or on axial skeleton. Abdomen: Soft nontender Neuro: Cranial nerves II through XII are intact, neurovascularly intact in all extremities with 2+ DTRs and 2+ pulses. Lymph: No lymphadenopathy of posterior or anterior cervical chain or axillae bilaterally.  Gait normal with good balance and coordination.  MSK: Non tender with full range of motion and good stability and symmetric strength and tone of  elbows, wrist, hip,  and ankles bilaterally.  Back Exam:  Inspection: Unremarkable  Motion: Flexion 45 deg, Extension 45 deg, Side Bending to 45 deg bilaterally,  Rotation to 45 deg bilaterally  SLR laying: Negative  XSLR laying:  Negative  Palpable tenderness: nontender today FABER: Positive right Sensory change: Gross sensation intact to all lumbar and sacral dermatomes.  Reflexes: 2+ at both patellar tendons, 2+ at achilles tendons, Babinski's downgoing.  Strength at foot  Plantar-flexion: 5/5 Dorsi-flexion: 5/5 Eversion: 5/5 Inversion: 5/5  Leg strength  Quad: 5/5 Hamstring: 5/5 Hip flexor: 5/5 Hip abductors: 5/5  Gait unremarkable.   OMT Physical Exam Cervical  C7 flexed rotated and side bent right   Thoracic T3-5 neutral position group curve rotated left side bent right T7 E RS left  Lumbar L 2  flexed rotated and side bent right  Sacrum right on right

## 2014-10-02 NOTE — Assessment & Plan Note (Signed)
Patient continues to have some mild dysfunction and patient was given home exercises that I think will be beneficial. Encourage her to continue the same regimen at this time. Discussed signs and symptoms and when to come back for further evaluation. Patient has a muscle relaxer as needed. Patient continues on gabapentin at a very high dose. This is given by another provider. Patient and will follow-up with me on an as-needed basis.

## 2014-11-06 ENCOUNTER — Encounter: Payer: Self-pay | Admitting: Family Medicine

## 2014-11-06 ENCOUNTER — Ambulatory Visit (INDEPENDENT_AMBULATORY_CARE_PROVIDER_SITE_OTHER): Payer: 59 | Admitting: Family Medicine

## 2014-11-06 VITALS — BP 110/64 | HR 91 | Ht 65.0 in | Wt 105.0 lb

## 2014-11-06 DIAGNOSIS — M9903 Segmental and somatic dysfunction of lumbar region: Secondary | ICD-10-CM

## 2014-11-06 DIAGNOSIS — M533 Sacrococcygeal disorders, not elsewhere classified: Secondary | ICD-10-CM

## 2014-11-06 DIAGNOSIS — M9901 Segmental and somatic dysfunction of cervical region: Secondary | ICD-10-CM

## 2014-11-06 DIAGNOSIS — M9904 Segmental and somatic dysfunction of sacral region: Secondary | ICD-10-CM

## 2014-11-06 DIAGNOSIS — M999 Biomechanical lesion, unspecified: Secondary | ICD-10-CM

## 2014-11-06 NOTE — Progress Notes (Signed)
Patient ID: Tracey [name], female   DOB: November 06, 1951, 63 y.o.   MRN: [id number] Patient is here for manipulation therapy. Patient was doing ornaments and occurring for the holidays. Patient had some mild discomfort in the mid back as well as low back. Denies any radiation to a near the extremity. States that it feels very similar to her other exacerbations but not as bad. Denies any fevers or chills or any abnormal weight loss. Patient has responded to osteopathic manipulation previously.  Past medical history, social, surgical and family history all reviewed.   Denies fever, chills, nausea vomiting abdominal pain, dysuria, chest pain, shortness of breath dyspnea on exertion or numbness in extremities   Physical exam Blood pressure 110/64, pulse 91, height 5\' 5"  (1.651 m), weight 105 lb (47.628 kg), SpO2 99 %.  General: No apparent distress minorly tearful when discussing friend. HEENT: Pupils equal round to light and accommodation, extraocular movements Respiratory: Patient's speak in full sentences and does not appear short of breath Cardiovascular: No lower extremity edema, non tender, no erythema Skin: Warm dry intact with no signs of infection or rash on extremities or on axial skeleton. Abdomen: Soft nontender Neuro: Cranial nerves II through XII are intact, neurovascularly intact in all extremities with 2+ DTRs and 2+ pulses. Lymph: No lymphadenopathy of posterior or anterior cervical chain or axillae bilaterally.  Gait normal with good balance and coordination.  MSK: Non tender with full range of motion and good stability and symmetric strength and tone of  elbows, wrist, hip,  and ankles bilaterally.  Back Exam:  Inspection: Unremarkable  Motion: Flexion 45 deg, Extension 45 deg, Side Bending to 45 deg bilaterally,  Rotation to 45 deg bilaterally  SLR laying: Negative  XSLR laying: Negative  Palpable tenderness: nontender today FABER: Positive right Sensory change: Gross  sensation intact to all lumbar and sacral dermatomes.  Reflexes: 2+ at both patellar tendons, 2+ at achilles tendons, Babinski's downgoing.  Strength at foot  Plantar-flexion: 5/5 Dorsi-flexion: 5/5 Eversion: 5/5 Inversion: 5/5  Leg strength  Quad: 5/5 Hamstring: 5/5 Hip flexor: 5/5 Hip abductors: 4/5  Gait unremarkable.   OMT Physical Exam Cervical  C7 flexed rotated and side bent right   Thoracic T3-5 neutral position group curve rotated left side bent right  Lumbar L 2  flexed rotated and side bent right  Sacrum right on right

## 2014-11-06 NOTE — Assessment & Plan Note (Signed)
Patient continues to have some sacral dysfunction. Patient does have a history of osteoporosis. Patient does respond very well to conservative therapy. Patient will continue the medications that she has at this time and has Valium for any breakthrough muscle spasms. We discussed icing protocol. Patient and will follow-up with me again in 4-6 weeks for further evaluation and treatment. Patient knows what activity seem to exacerbate it and will try to avoid it as much as possible.

## 2014-11-06 NOTE — Patient Instructions (Addendum)
Good to see you.  Conitnue the exercises especially posture.  Happy holidays! See me again usually when you are taking down the ornaments.

## 2014-11-06 NOTE — Assessment & Plan Note (Signed)
Decision today to treat with OMT was based on Physical Exam  After verbal consent patient was treated with HVLA, ME, FPR techniques in Cervical, thoracic,  lumbar and sacral areas  Patient tolerated the procedure well with improvement in symptoms  Patient given exercises, stretches and lifestyle modifications  See medications in patient instructions if given  Patient will follow up in 4-6 weeks                          

## 2014-12-11 ENCOUNTER — Ambulatory Visit (INDEPENDENT_AMBULATORY_CARE_PROVIDER_SITE_OTHER): Payer: 59 | Admitting: Family Medicine

## 2014-12-11 ENCOUNTER — Encounter: Payer: Self-pay | Admitting: Family Medicine

## 2014-12-11 VITALS — BP 130/82 | HR 96 | Ht 65.0 in | Wt 105.0 lb

## 2014-12-11 DIAGNOSIS — M62838 Other muscle spasm: Secondary | ICD-10-CM

## 2014-12-11 DIAGNOSIS — M9904 Segmental and somatic dysfunction of sacral region: Secondary | ICD-10-CM

## 2014-12-11 DIAGNOSIS — M533 Sacrococcygeal disorders, not elsewhere classified: Secondary | ICD-10-CM

## 2014-12-11 DIAGNOSIS — M9901 Segmental and somatic dysfunction of cervical region: Secondary | ICD-10-CM

## 2014-12-11 DIAGNOSIS — M999 Biomechanical lesion, unspecified: Secondary | ICD-10-CM

## 2014-12-11 DIAGNOSIS — M9903 Segmental and somatic dysfunction of lumbar region: Secondary | ICD-10-CM

## 2014-12-11 DIAGNOSIS — M6248 Contracture of muscle, other site: Secondary | ICD-10-CM

## 2014-12-11 NOTE — Progress Notes (Signed)
Patient ID: Tracey [name], female   DOB: 05-14-1951, 64 y.o.   MRN: [id number]   Chief complaint: Low back pain  History of present illness: Patient is a very pleasant 64 year old female who is seen me for quite some time for sacroiliac dysfunction. Patient has responded to conservative therapy including minimal use of muscle relaxers and Valium. Patient does respond fairly well to osteopathic manipulation. Patient states overall she's been doing relatively well but has felt somewhat tight since the holidays. Patient denies any new symptoms just seems to be some exacerbation of the previous symptoms. Not stopping her from any activities. Does have some tightness in her neck that is limiting her range of motion see states. No radiation into the arms or lower extremity.  Past medical history, social, surgical and family history all reviewed.   Denies fever, chills, nausea vomiting abdominal pain, dysuria, chest pain, shortness of breath dyspnea on exertion or numbness in extremities   Physical exam Blood pressure 130/82, pulse 96, height 5\' 5"  (1.651 m), weight 105 lb (47.628 kg), SpO2 97 %.  General: No apparent distress minorly tearful when discussing friend. HEENT: Pupils equal round to light and accommodation, extraocular movements Respiratory: Patient's speak in full sentences and does not appear short of breath Cardiovascular: No lower extremity edema, non tender, no erythema Skin: Warm dry intact with no signs of infection or rash on extremities or on axial skeleton. Abdomen: Soft nontender Neuro: Cranial nerves II through XII are intact, neurovascularly intact in all extremities with 2+ DTRs and 2+ pulses. Lymph: No lymphadenopathy of posterior or anterior cervical chain or axillae bilaterally.  Gait normal with good balance and coordination.  MSK: Non tender with full range of motion and good stability and symmetric strength and tone of  elbows, wrist, hip,  and ankles bilaterally.   Back Exam:  Inspection: Unremarkable  Motion: Flexion 45 deg, Extension 45 deg, Side Bending to 45 deg bilaterally,  Rotation to 45 deg bilaterally  SLR laying: Negative  XSLR laying: Negative  Palpable tenderness: nontender today FABER: Positive right Sensory change: Gross sensation intact to all lumbar and sacral dermatomes.  Reflexes: 2+ at both patellar tendons, 2+ at achilles tendons, Babinski's downgoing.  Strength at foot  Plantar-flexion: 5/5 Dorsi-flexion: 5/5 Eversion: 5/5 Inversion: 5/5  Leg strength  Quad: 5/5 Hamstring: 5/5 Hip flexor: 5/5 Hip abductors: 4/5  Gait unremarkable.   OMT Physical Exam Cervical  C7 flexed rotated and side bent right   Thoracic T3-5 neutral position group curve rotated left side bent right  Lumbar L 2  flexed rotated and side bent right  Sacrum right on right

## 2014-12-11 NOTE — Assessment & Plan Note (Signed)
Discuss again with patient that is going to play a big role. We discussed skin hip abductor strengthening exercises that will be beneficial. Patient is going on the cruise for the next 2 weeks we discussed different changes she can make to her exercises stretching so she can continue to do these on a regular basis. We discussed the icing protocol late at night. I like patient come back again in 4 weeks for further evaluation and treatment.

## 2014-12-11 NOTE — Assessment & Plan Note (Signed)
Improved after manipulation.

## 2014-12-11 NOTE — Assessment & Plan Note (Signed)
Decision today to treat with OMT was based on Physical Exam  After verbal consent patient was treated with HVLA, ME, FPR techniques in  Cervical, thoracic, lumbar and sacral areas  Patient tolerated the procedure well with improvement in symptoms  Patient given exercises, stretches and lifestyle modifications  See medications in patient instructions if given  Patient will follow up in 4 weeks and this is sooner than regular secondary to her trip.

## 2014-12-11 NOTE — Patient Instructions (Signed)
Good to see you.  Happy New Year! You are going to have a great trip! Wear a backpack with both straps.  Ice at the end of the day can help Continue the posture exercises.  Otherwise see me again in 4 weeks.

## 2014-12-22 ENCOUNTER — Ambulatory Visit: Payer: 59 | Admitting: Family Medicine

## 2015-05-03 ENCOUNTER — Ambulatory Visit (INDEPENDENT_AMBULATORY_CARE_PROVIDER_SITE_OTHER): Payer: 59 | Admitting: Family Medicine

## 2015-05-03 ENCOUNTER — Encounter: Payer: Self-pay | Admitting: Family Medicine

## 2015-05-03 VITALS — BP 112/82 | HR 87 | Ht 65.0 in | Wt 105.0 lb

## 2015-05-03 DIAGNOSIS — M9902 Segmental and somatic dysfunction of thoracic region: Secondary | ICD-10-CM

## 2015-05-03 DIAGNOSIS — M9904 Segmental and somatic dysfunction of sacral region: Secondary | ICD-10-CM

## 2015-05-03 DIAGNOSIS — M9903 Segmental and somatic dysfunction of lumbar region: Secondary | ICD-10-CM

## 2015-05-03 DIAGNOSIS — M533 Sacrococcygeal disorders, not elsewhere classified: Secondary | ICD-10-CM

## 2015-05-03 DIAGNOSIS — M9901 Segmental and somatic dysfunction of cervical region: Secondary | ICD-10-CM

## 2015-05-03 DIAGNOSIS — M999 Biomechanical lesion, unspecified: Secondary | ICD-10-CM

## 2015-05-03 NOTE — Assessment & Plan Note (Signed)
Doing well.  Discussed exercises and posture.  What to avoid and what to do on regular basis. With patient retiring I think she will do much better overall. Patient given topical anti-inflammatory strut. Patient come back and see me again on an as-needed basis.

## 2015-05-03 NOTE — Assessment & Plan Note (Signed)
Decision today to treat with OMT was based on Physical Exam  After verbal consent patient was treated with HVLA, ME, FPR techniques in  Cervical, thoracic, lumbar and sacral areas  Patient tolerated the procedure well with improvement in symptoms  Patient given exercises, stretches and lifestyle modifications  See medications in patient instructions if given  Patient will follow up as needed

## 2015-05-03 NOTE — Patient Instructions (Addendum)
Congrats!!!!  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  You will be missed.  I am sorry I am missing the party Conitnue to work on the exercises and posture now that you have some time. Continue the vitamin D See me when you need me.

## 2015-05-03 NOTE — Progress Notes (Signed)
Pre visit review using our clinic review tool, if applicable. No additional management support is needed unless otherwise documented below in the visit note. 

## 2015-05-03 NOTE — Progress Notes (Signed)
Patient ID: Tracey [name], female   DOB: 02-12-1951, 63 y.o.   MRN: [id number]   Chief complaint: Low back pain  History of present illness: Patient is a very pleasant 64 year old female who is seen me for quite some time for sacroiliac dysfunction. Patient has responded to conservative therapy including minimal use of muscle relaxers and Valium.  Patient is doing well. She is having some exacerbation of the sacroiliac joint on the right side. Patient states that this is keeping her up at night. Patient is having some stress could she is retiring but she is very excited about this. Nothing that his stopping her from any daily activities.   Past medical history, social, surgical and family history all reviewed.   Denies fever, chills, nausea vomiting abdominal pain, dysuria, chest pain, shortness of breath dyspnea on exertion or numbness in extremities   Physical exam Blood pressure 112/82, pulse 87, height 5\' 5"  (1.651 m), weight 105 lb (47.628 kg), SpO2 99 %.  General: No apparent distress minorly tearful when discussing friend. HEENT: Pupils equal round to light and accommodation, extraocular movements Respiratory: Patient's speak in full sentences and does not appear short of breath Cardiovascular: No lower extremity edema, non tender, no erythema Skin: Warm dry intact with no signs of infection or rash on extremities or on axial skeleton. Abdomen: Soft nontender Neuro: Cranial nerves II through XII are intact, neurovascularly intact in all extremities with 2+ DTRs and 2+ pulses. Lymph: No lymphadenopathy of posterior or anterior cervical chain or axillae bilaterally.  Gait normal with good balance and coordination.  MSK: Non tender with full range of motion and good stability and symmetric strength and tone of  elbows, wrist, hip,  and ankles bilaterally.  Back Exam:  Inspection: Unremarkable  Motion: Flexion 45 deg, Extension 45 deg, Side Bending to 45 deg bilaterally,  Rotation to  45 deg bilaterally  SLR laying: Negative  XSLR laying: Negative  Palpable tenderness: nontender today FABER: Positive right Sensory change: Gross sensation intact to all lumbar and sacral dermatomes.  Reflexes: 2+ at both patellar tendons, 2+ at achilles tendons, Babinski's downgoing.  Strength at foot  Plantar-flexion: 5/5 Dorsi-flexion: 5/5 Eversion: 5/5 Inversion: 5/5  Leg strength  Quad: 5/5 Hamstring: 5/5 Hip flexor: 5/5 Hip abductors: 4/5  Gait unremarkable.   OMT Physical Exam Cervical  C7 flexed rotated and side bent right   Thoracic T3-5 neutral position group curve rotated left side bent right  Lumbar L 2  flexed rotated and side bent right  Sacrum right on right  Same as previous

## 2015-05-22 ENCOUNTER — Ambulatory Visit (INDEPENDENT_AMBULATORY_CARE_PROVIDER_SITE_OTHER): Payer: 59 | Admitting: Family Medicine

## 2015-05-22 ENCOUNTER — Encounter: Payer: Self-pay | Admitting: Family Medicine

## 2015-05-22 VITALS — BP 122/62 | HR 69 | Ht 65.0 in | Wt 105.0 lb

## 2015-05-22 DIAGNOSIS — M9902 Segmental and somatic dysfunction of thoracic region: Secondary | ICD-10-CM

## 2015-05-22 DIAGNOSIS — M533 Sacrococcygeal disorders, not elsewhere classified: Secondary | ICD-10-CM

## 2015-05-22 DIAGNOSIS — M9903 Segmental and somatic dysfunction of lumbar region: Secondary | ICD-10-CM | POA: Diagnosis not present

## 2015-05-22 DIAGNOSIS — M9904 Segmental and somatic dysfunction of sacral region: Secondary | ICD-10-CM | POA: Diagnosis not present

## 2015-05-22 DIAGNOSIS — M9901 Segmental and somatic dysfunction of cervical region: Secondary | ICD-10-CM | POA: Diagnosis not present

## 2015-05-22 DIAGNOSIS — M999 Biomechanical lesion, unspecified: Secondary | ICD-10-CM

## 2015-05-22 NOTE — Assessment & Plan Note (Signed)
Patient overall is doing really well. We discussed though hip abductor strengthening being the most concerning. Discuss continuing the weightlifting with patients osteoporosis. Patient is going to some more range of motion exercises. Patient will come back and see me again in 3-4 weeks for further evaluation and treatment.

## 2015-05-22 NOTE — Progress Notes (Signed)
Pre visit review using our clinic review tool, if applicable. No additional management support is needed unless otherwise documented below in the visit note. 

## 2015-05-22 NOTE — Progress Notes (Signed)
Patient ID: Markela A [place], female   DOB: 11-Nov-1951, 64 y.o.   MRN: [id number]   Chief complaint: Low back pain follow up  History of present illness: Patient is a very pleasant 64 year old female who is seen me for quite some time for sacroiliac dysfunction. Patient recently retired. Patient states that with the responsibility off her shoulder she is feeling much better as well. Patient denies any numbness or tingling. Denies any radiation of pain. States overall she has been more active as well.   Past medical history, social, surgical and family history all reviewed.   Denies fever, chills, nausea vomiting abdominal pain, dysuria, chest pain, shortness of breath dyspnea on exertion or numbness in extremities   Physical exam Blood pressure 122/62, pulse 69, height 5\' 5"  (1.651 m), weight 105 lb (47.628 kg), SpO2 99 %.  General: No apparent distress minorly tearful when discussing friend. HEENT: Pupils equal round to light and accommodation, extraocular movements Respiratory: Patient's speak in full sentences and does not appear short of breath Cardiovascular: No lower extremity edema, non tender, no erythema Skin: Warm dry intact with no signs of infection or rash on extremities or on axial skeleton. Abdomen: Soft nontender Neuro: Cranial nerves II through XII are intact, neurovascularly intact in all extremities with 2+ DTRs and 2+ pulses. Lymph: No lymphadenopathy of posterior or anterior cervical chain or axillae bilaterally.  Gait normal with good balance and coordination.  MSK: Non tender with full range of motion and good stability and symmetric strength and tone of  elbows, wrist, hip,  and ankles bilaterally.  Back Exam:  Inspection: Unremarkable  Motion: Flexion 45 deg, Extension 45 deg, Side Bending to 45 deg bilaterally,  Rotation to 45 deg bilaterally  SLR laying: Negative  XSLR laying: Negative  Palpable tenderness: nontender today FABER: Negative today which is an  improvement Sensory change: Gross sensation intact to all lumbar and sacral dermatomes.  Reflexes: 2+ at both patellar tendons, 2+ at achilles tendons, Babinski's downgoing.  Strength at foot  Plantar-flexion: 5/5 Dorsi-flexion: 5/5 Eversion: 5/5 Inversion: 5/5  Leg strength  Quad: 5/5 Hamstring: 5/5 Hip flexor: 5/5 Hip abductors: 4/5  Gait unremarkable.   OMT Physical Exam Cervical  C7 flexed rotated and side bent right   Thoracic T3-5 neutral position group curve rotated left side bent right  Lumbar L 2  flexed rotated and side bent right  Sacrum right on right  Same as previous

## 2015-05-22 NOTE — Patient Instructions (Signed)
You are retired!  Go enjoy the lake Lets space it out to 3-4 weeks Stay active

## 2015-05-22 NOTE — Assessment & Plan Note (Signed)
Decision today to treat with OMT was based on Physical Exam  After verbal consent patient was treated with HVLA, ME, FPR techniques in  Cervical, thoracic, lumbar and sacral areas  Patient tolerated the procedure well with improvement in symptoms  Patient given exercises, stretches and lifestyle modifications  See medications in patient instructions if given  Patient will follow up in 3-4 weeks.

## 2015-07-25 ENCOUNTER — Other Ambulatory Visit: Payer: Self-pay

## 2015-07-25 DIAGNOSIS — Z1231 Encounter for screening mammogram for malignant neoplasm of breast: Secondary | ICD-10-CM

## 2015-07-26 ENCOUNTER — Ambulatory Visit
Admission: RE | Admit: 2015-07-26 | Discharge: 2015-07-26 | Disposition: A | Discharge status: Home / Self Care | Payer: PPO | Source: Ambulatory Visit

## 2015-07-26 DIAGNOSIS — Z1231 Encounter for screening mammogram for malignant neoplasm of breast: Secondary | ICD-10-CM

## 2015-07-31 ENCOUNTER — Other Ambulatory Visit (HOSPITAL_COMMUNITY): Payer: Self-pay | Admitting: *Deleted

## 2015-08-01 ENCOUNTER — Encounter (HOSPITAL_COMMUNITY)
Admission: RE | Admit: 2015-08-01 | Discharge: 2015-08-01 | Disposition: A | Discharge status: Home / Self Care | Payer: PPO | Source: Ambulatory Visit | Attending: Internal Medicine | Admitting: Internal Medicine

## 2015-08-01 DIAGNOSIS — M810 Age-related osteoporosis without current pathological fracture: Secondary | ICD-10-CM | POA: Diagnosis present

## 2015-08-01 MED ORDER — ZOLEDRONIC ACID 5 MG/100ML IV SOLN
5.0000 mg | Freq: Once | INTRAVENOUS | Status: AC
  Administered 2015-08-01: 5 mg via INTRAVENOUS

## 2015-08-29 ENCOUNTER — Telehealth: Payer: Self-pay | Admitting: Family Medicine
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
## 2015-08-29 NOTE — Telephone Encounter (Signed)
Spoke to pt, she is coming in tomorrow at 230p.

## 2015-08-30 ENCOUNTER — Ambulatory Visit (INDEPENDENT_AMBULATORY_CARE_PROVIDER_SITE_OTHER): Payer: PPO | Admitting: Family Medicine

## 2015-08-30 ENCOUNTER — Encounter: Payer: Self-pay | Admitting: Family Medicine

## 2015-08-30 VITALS — BP 130/80 | HR 85 | Ht 65.0 in | Wt 100.0 lb

## 2015-08-30 DIAGNOSIS — S7000XA Contusion of unspecified hip, initial encounter: Secondary | ICD-10-CM | POA: Insufficient documentation

## 2015-08-30 DIAGNOSIS — S7002XA Contusion of left hip, initial encounter: Secondary | ICD-10-CM | POA: Diagnosis not present

## 2015-08-30 NOTE — Progress Notes (Signed)
Pre visit review using our clinic review tool, if applicable. No additional management support is needed unless otherwise documented below in the visit note. 

## 2015-08-30 NOTE — Assessment & Plan Note (Signed)
Hip contusion noted. Seems to be resolving. We discussed over-the-counter topical medications. Patient will do icing protocol we discussed the possibility of a compression sleeve. Disorder patient continues to do well she can follow-up on an as-needed basis.
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
## 2015-08-30 NOTE — Patient Instructions (Addendum)
Good to see you Ice, ice, ice  Arnica lotion 2 times daily to the bruise.  Tylenol better than ibuprofen for next 3 weeks.  Compression sleeve on the thigh could help Try a little better shoe.  See me again when you need me.  Get back on the bike.

## 2015-10-24 ENCOUNTER — Ambulatory Visit (INDEPENDENT_AMBULATORY_CARE_PROVIDER_SITE_OTHER): Payer: PPO | Admitting: Family Medicine

## 2015-10-24 ENCOUNTER — Encounter: Payer: Self-pay | Admitting: Family Medicine

## 2015-10-24 VITALS — BP 130/80 | HR 85 | Ht 65.0 in | Wt 102.0 lb

## 2015-10-24 DIAGNOSIS — M533 Sacrococcygeal disorders, not elsewhere classified: Secondary | ICD-10-CM

## 2015-10-24 DIAGNOSIS — M9901 Segmental and somatic dysfunction of cervical region: Secondary | ICD-10-CM

## 2015-10-24 DIAGNOSIS — M9904 Segmental and somatic dysfunction of sacral region: Secondary | ICD-10-CM

## 2015-10-24 DIAGNOSIS — M9903 Segmental and somatic dysfunction of lumbar region: Secondary | ICD-10-CM | POA: Diagnosis not present

## 2015-10-24 DIAGNOSIS — M9902 Segmental and somatic dysfunction of thoracic region: Secondary | ICD-10-CM | POA: Diagnosis not present

## 2015-10-24 DIAGNOSIS — M999 Biomechanical lesion, unspecified: Secondary | ICD-10-CM

## 2015-10-24 NOTE — Assessment & Plan Note (Signed)
Decision today to treat with OMT was based on Physical Exam  After verbal consent patient was treated with HVLA, ME, FPR techniques in  Cervical, thoracic, lumbar and sacral areas  Patient tolerated the procedure well with improvement in symptoms  Patient given exercises, stretches and lifestyle modifications  See medications in patient instructions if given  Patient will follow up in 3-4 weeks.               

## 2015-10-24 NOTE — Progress Notes (Signed)
Pre visit review using our clinic review tool, if applicable. No additional management support is needed unless otherwise documented below in the visit note. 

## 2015-10-24 NOTE — Patient Instructions (Signed)
Good to see you Happy [place] day! Ice is your friend after activity Ibuprofen 600mg  3 times a day for 3 days Alternate exercises with glutes and flexor.  See me again after decorating.
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
## 2015-10-24 NOTE — Assessment & Plan Note (Signed)
I do believe the patient had more of a rotation component as well as some mild greater trochanteric bursitis. We discussed different treatment options and patient elected try osteopathic manipulation. Did respond well. We discussed icing regimen. Patient given exercises to alternate between the hip flexor as well as the hip abductor strengthening. Patient try to do this on a regular basis. Encourage her to continue the vitamins. Patient does have muscle relaxer if needed. Patient will come back and see me again in 2-3 weeks with her doing a lot of more manual labor in the next several weeks.

## 2015-11-14 ENCOUNTER — Ambulatory Visit (INDEPENDENT_AMBULATORY_CARE_PROVIDER_SITE_OTHER): Payer: PPO | Admitting: Family Medicine

## 2015-11-14 ENCOUNTER — Encounter: Payer: Self-pay | Admitting: Family Medicine

## 2015-11-14 VITALS — BP 132/74 | HR 94 | Ht 65.0 in | Wt 101.0 lb

## 2015-11-14 DIAGNOSIS — G5701 Lesion of sciatic nerve, right lower limb: Secondary | ICD-10-CM

## 2015-11-14 DIAGNOSIS — M9903 Segmental and somatic dysfunction of lumbar region: Secondary | ICD-10-CM | POA: Diagnosis not present

## 2015-11-14 DIAGNOSIS — M9904 Segmental and somatic dysfunction of sacral region: Secondary | ICD-10-CM

## 2015-11-14 DIAGNOSIS — M999 Biomechanical lesion, unspecified: Secondary | ICD-10-CM

## 2015-11-14 DIAGNOSIS — M9901 Segmental and somatic dysfunction of cervical region: Secondary | ICD-10-CM | POA: Diagnosis not present

## 2015-11-14 NOTE — Progress Notes (Signed)
Pre visit review using our clinic review tool, if applicable. No additional management support is needed unless otherwise documented below in the visit note. 

## 2015-11-14 NOTE — Patient Instructions (Signed)
Good to see you Ice is your friend Tennis ball in back right pocket  New exercises 3 times a week.  Muscle relaxer at night regularly for 3 nights.  Pick your poison.  See me again in 2-3 weeks and if not better we may need to consider injection but bring a friend.

## 2015-11-14 NOTE — Progress Notes (Signed)
  [name] [place], [place] [address] Phone: [phone] Subjective:     CC: Left hip pain follow up sacroiliac dysfunction follow-up  [id number] Tracey [name] is a 64 y.o. female coming in with complaint of left hip pain. Patient was doing much better after her injury. Patient though does have sacroiliac joint dysfunction as well as some neck muscle spasms that are intermittent. Has responded very well to osteopathic manipulation. Patient states she is having worsening symptoms in the right buttocks. States that after sitting for long amount of time and is severely painful to get up and start walking. States if she tries to put all the pressure on one leg she also has pain in this area. Some mild radicular symptoms. No weakness. Patient states that it is seeming to get somewhat worse. Patient did have a large hip contusion on this side previously but this was on the side of the hip.    Past medical history, social, surgical and family history all reviewed in electronic medical record.   Review of Systems: No headache, visual changes, nausea, vomiting, diarrhea, constipation, dizziness, abdominal pain, skin rash, fevers, chills, night sweats, weight loss, swollen lymph nodes, body aches, joint swelling, muscle aches, chest pain, shortness of breath, mood changes.   Objective Blood pressure 132/74, pulse 94, height 5\' 5"  (1.651 m), weight 101 lb (45.813 kg), SpO2 96 %.  General: No apparent distress alert and oriented x3 mood and affect normal, dressed appropriately.  HEENT: Pupils equal, extraocular movements intact  Respiratory: Patient's speak in full sentences and does not appear short of breath  Cardiovascular: No lower extremity edema, non tender, no erythema  Skin: Warm dry intact with no signs of infection or rash on extremities or on axial skeleton.  Abdomen: Soft nontender  Neuro: Cranial nerves II through XII are intact,  neurovascularly intact in all extremities with 2+ DTRs and 2+ pulses.  Lymph: No lymphadenopathy of posterior or anterior cervical chain or axillae bilaterally.  Gait normal with good balance and coordination.  MSK:  Non tender with full range of motion and good stability and symmetric strength and tone of shoulders, elbows, wrist,, knee and ankles bilaterally.  Bilateral hip exam shows positive Faber's with mild tenderness over the greater trochanter bursitis bilaterally. Severe tenderness over the piriformis muscle on the right side a. Negative straight leg test of the back. Neurovascularly intact distally with full strength. Deep tendon reflexes are intact. OMT Physical Exam Cervical  C7 flexed rotated and side bent right   Thoracic T3-5 neutral position group curve rotated left side bent right  Lumbar L 2 flexed rotated and side bent right  Sacrum right on right    Impression and Recommendations:     This case required medical decision making of moderate complexity.

## 2015-11-14 NOTE — Assessment & Plan Note (Signed)
Patient given home exercises, icing protocol, and we discussed manual massage techniques. Did respond somewhat to osteopathic manipulation. I do believe that it is in a spasm. Patient will do a short course of a muscle relaxer. If continuing to give her difficulty we may need to consider injection. Patient will bring someone to drive her in case we decided to do this. Otherwise we'll see patient back again in 3 weeks for further evaluation.

## 2015-11-14 NOTE — Assessment & Plan Note (Signed)
Decision today to treat with OMT was based on Physical Exam  After verbal consent patient was treated with HVLA, ME, FPR techniques in  Cervical, thoracic, lumbar and sacral areas  Patient tolerated the procedure well with improvement in symptoms  Patient given exercises, stretches and lifestyle modifications  See medications in patient instructions if given  Patient will follow up in 3-4 weeks.               

## 2015-12-06 ENCOUNTER — Ambulatory Visit: Payer: PPO | Admitting: Family Medicine

## 2015-12-11 MED FILL — metroNIDAZOLE 1 % GEL: 1 | 30 days supply | Qty: 60 | Fill #1

## 2015-12-11 MED FILL — GABAPENTIN 400 MG CAPSULE: 400 | 90 days supply | Qty: 270 | Fill #1

## 2015-12-13 ENCOUNTER — Ambulatory Visit (INDEPENDENT_AMBULATORY_CARE_PROVIDER_SITE_OTHER): Payer: PPO | Admitting: Family Medicine

## 2015-12-13 ENCOUNTER — Encounter: Payer: Self-pay | Admitting: Family Medicine

## 2015-12-13 VITALS — BP 106/72 | HR 80 | Ht 65.0 in | Wt 100.0 lb

## 2015-12-13 DIAGNOSIS — M999 Biomechanical lesion, unspecified: Secondary | ICD-10-CM

## 2015-12-13 DIAGNOSIS — M9903 Segmental and somatic dysfunction of lumbar region: Secondary | ICD-10-CM | POA: Diagnosis not present

## 2015-12-13 DIAGNOSIS — M9904 Segmental and somatic dysfunction of sacral region: Secondary | ICD-10-CM

## 2015-12-13 DIAGNOSIS — G5701 Lesion of sciatic nerve, right lower limb: Secondary | ICD-10-CM

## 2015-12-13 DIAGNOSIS — M9901 Segmental and somatic dysfunction of cervical region: Secondary | ICD-10-CM | POA: Diagnosis not present

## 2015-12-13 NOTE — Assessment & Plan Note (Signed)
Much better at this time. We discussed icing regimen and home exercises. We discussed which activities to do an which was potentially avoid. Patient is on a high dose of gabapentin at this time. Patient come back and see me again more on an as-needed basis as long as she is doing well. Any radicular symptoms she'll come back sooner.

## 2015-12-13 NOTE — Patient Instructions (Signed)
Good to see you Ice is your friend Stay active at this time Machines are better than free weights OK to start lifting but keep hands in peripheral vision Have a great time zip lining See me when you need me but make an appointment. For when you return.

## 2015-12-13 NOTE — Progress Notes (Signed)
  [name] [place], [place] [address] Phone: [phone] Subjective:     CC: Left hip pain follow up sacroiliac dysfunction follow-up  [id number] Tracey [name] is a 65 y.o. female coming in with complaint of left hip pain.  Patient was having more of an exacerbation of her piriformis. Been doing exercises on a more regular basis and states that she is feeling significantly better. Patient denies any numbness or tingling. Patient states that she is doing better overall. Having some mild discomfort from time to time. States nothing that it was before. States that her neck is also feeling significantly better. Patient is motivated to start going to the gym. Patient is leaving for 2 week cruise though starting on Sunday and then would like to get back in the gym and wants no what would be safe alternatives to her treatment and exercises.    Past medical history, social, surgical and family history all reviewed in electronic medical record.   Review of Systems: No headache, visual changes, nausea, vomiting, diarrhea, constipation, dizziness, abdominal pain, skin rash, fevers, chills, night sweats, weight loss, swollen lymph nodes, body aches, joint swelling, muscle aches, chest pain, shortness of breath, mood changes.   Objective Blood pressure 106/72, pulse 80, height 5\' 5"  (1.651 m), weight 100 lb (45.36 kg), SpO2 99 %.  General: No apparent distress alert and oriented x3 mood and affect normal, dressed appropriately.  HEENT: Pupils equal, extraocular movements intact  Respiratory: Patient's speak in full sentences and does not appear short of breath  Cardiovascular: No lower extremity edema, non tender, no erythema  Skin: Warm dry intact with no signs of infection or rash on extremities or on axial skeleton.  Abdomen: Soft nontender  Neuro: Cranial nerves II through XII are intact, neurovascularly intact in all extremities with 2+ DTRs and 2+  pulses.  Lymph: No lymphadenopathy of posterior or anterior cervical chain or axillae bilaterally.  Gait normal with good balance and coordination.  MSK:  Non tender with full range of motion and good stability and symmetric strength and tone of shoulders, elbows, wrist,, knee and ankles bilaterally.  Bilateral hip exam shows positive Faber's with mild tenderness over the greater trochanter bursitis bilaterally. Severe tenderness over the piriformis muscle on the right side a. Negative straight leg test of the back. Neurovascularly intact distally with full strength. Deep tendon reflexes are intact.  OMT Physical Exam Cervical  C7 flexed rotated and side bent right   Thoracic T3-5 neutral position group curve rotated left side bent right T8 extended rotated and side bent left Lumbar L 2 flexed rotated and side bent right  Sacrum right on right    Impression and Recommendations:     This case required medical decision making of moderate complexity.

## 2015-12-13 NOTE — Progress Notes (Signed)
Pre visit review using our clinic review tool, if applicable. No additional management support is needed unless otherwise documented below in the visit note. 

## 2015-12-13 NOTE — Assessment & Plan Note (Signed)
Decision today to treat with OMT was based on Physical Exam  After verbal consent patient was treated with HVLA, ME, FPR techniques in Cervical, thoracic,  lumbar and sacral areas  Patient tolerated the procedure well with improvement in symptoms  Patient given exercises, stretches and lifestyle modifications  See medications in patient instructions if given  Patient will follow up in 4-6 weeks                          

## 2015-12-20 ENCOUNTER — Encounter: Payer: Self-pay | Admitting: Family Medicine

## 2015-12-20 ENCOUNTER — Ambulatory Visit (INDEPENDENT_AMBULATORY_CARE_PROVIDER_SITE_OTHER): Payer: PPO | Admitting: Family Medicine

## 2015-12-20 VITALS — BP 130/72 | HR 83 | Ht 65.0 in | Wt 101.0 lb

## 2015-12-20 DIAGNOSIS — M9904 Segmental and somatic dysfunction of sacral region: Secondary | ICD-10-CM

## 2015-12-20 DIAGNOSIS — G5701 Lesion of sciatic nerve, right lower limb: Secondary | ICD-10-CM | POA: Diagnosis not present

## 2015-12-20 DIAGNOSIS — M999 Biomechanical lesion, unspecified: Secondary | ICD-10-CM

## 2015-12-20 DIAGNOSIS — M9903 Segmental and somatic dysfunction of lumbar region: Secondary | ICD-10-CM

## 2015-12-20 DIAGNOSIS — M9901 Segmental and somatic dysfunction of cervical region: Secondary | ICD-10-CM

## 2015-12-20 NOTE — Progress Notes (Signed)
  [name] [place], [place] [address] Phone: [phone] Subjective:     CC: Left hip pain follow up sacroiliac dysfunction follow-up  [id number] Joniece A Martinique is a 65 y.o. female coming in with complaint of left hip pain. Patient is having more of a right-sided pain today though. Patient was traveling and in a car for a long time turning around talking to another individual. Patient states that she felt a significant tightness. Tightness is cut away. No radiation down the leg. States that this seems to be the piriformis again as well as the sacrum. Patient has had these different problems for a long time. Patient has tried anti-inflammatory that was helpful as well as a muscle relaxer at night. Past medical history, social, surgical and family history all reviewed in electronic medical record.   Review of Systems: No headache, visual changes, nausea, vomiting, diarrhea, constipation, dizziness, abdominal pain, skin rash, fevers, chills, night sweats, weight loss, swollen lymph nodes, body aches, joint swelling, muscle aches, chest pain, shortness of breath, mood changes.   Objective Blood pressure 130/72, pulse 83, height 5\' 5"  (1.651 m), weight 101 lb (45.813 kg), SpO2 94 %.  General: No apparent distress alert and oriented x3 mood and affect normal, dressed appropriately.  HEENT: Pupils equal, extraocular movements intact  Respiratory: Patient's speak in full sentences and does not appear short of breath  Cardiovascular: No lower extremity edema, non tender, no erythema  Skin: Warm dry intact with no signs of infection or rash on extremities or on axial skeleton.  Abdomen: Soft nontender  Neuro: Cranial nerves II through XII are intact, neurovascularly intact in all extremities with 2+ DTRs and 2+ pulses.  Lymph: No lymphadenopathy of posterior or anterior cervical chain or axillae bilaterally.  Gait normal with good balance and  coordination.  MSK:  Non tender with full range of motion and good stability and symmetric strength and tone of shoulders, elbows, wrist,, knee and ankles bilaterally.  Bilateral hip exam shows positive Faber's w with increasing discomfort over the right piriformis as well as the right paraspinal musculature. Negative straight leg test. Positive Corky Sox as stated above. Neurovascular intact with full strength and full range of motion.  OMT Physical Exam Cervical  C2 flexed rotated and side bent left C7 flexed rotated and side bent right   Thoracic T3-5 neutral position group curve rotated left side bent right T8 extended rotated and side bent left Lumbar L 2 flexed rotated and side bent right L4 flexed rotated and side bent right  Sacrum right on right    Impression and Recommendations:     This case required medical decision making of moderate complexity.

## 2015-12-20 NOTE — Patient Instructions (Signed)
Good to see you See me again in 4 ish week

## 2015-12-20 NOTE — Assessment & Plan Note (Signed)
Mild exacerbation of the piriformis. We discussed with patient about ergonomics throughout sitting a long amount of time. We discussed icing regimen. We discussed manual massage techniques. Patient will start doing the exercises more regularly. Patient will come back and see me again in 3-4 weeks for further evaluation and treatment.

## 2015-12-20 NOTE — Assessment & Plan Note (Signed)
Decision today to treat with OMT was based on Physical Exam  After verbal consent patient was treated with HVLA, ME, FPR techniques in  Cervical, thoracic, lumbar and sacral areas  Patient tolerated the procedure well with improvement in symptoms  Patient given exercises, stretches and lifestyle modifications  See medications in patient instructions if given  Patient will follow up in 3-4 weeks.               

## 2015-12-20 NOTE — Progress Notes (Signed)
Pre visit review using our clinic review tool, if applicable. No additional management support is needed unless otherwise documented below in the visit note. 

## 2015-12-21 DIAGNOSIS — H04123 Dry eye syndrome of bilateral lacrimal glands: Secondary | ICD-10-CM | POA: Diagnosis not present

## 2016-01-14 ENCOUNTER — Ambulatory Visit (INDEPENDENT_AMBULATORY_CARE_PROVIDER_SITE_OTHER): Payer: PPO | Admitting: Family Medicine

## 2016-01-14 ENCOUNTER — Encounter: Payer: Self-pay | Admitting: Family Medicine

## 2016-01-14 VITALS — BP 128/82 | HR 84 | Ht 65.0 in | Wt 101.0 lb

## 2016-01-14 DIAGNOSIS — M9904 Segmental and somatic dysfunction of sacral region: Secondary | ICD-10-CM

## 2016-01-14 DIAGNOSIS — M999 Biomechanical lesion, unspecified: Secondary | ICD-10-CM

## 2016-01-14 DIAGNOSIS — M533 Sacrococcygeal disorders, not elsewhere classified: Secondary | ICD-10-CM | POA: Diagnosis not present

## 2016-01-14 DIAGNOSIS — M9902 Segmental and somatic dysfunction of thoracic region: Secondary | ICD-10-CM

## 2016-01-14 DIAGNOSIS — M9903 Segmental and somatic dysfunction of lumbar region: Secondary | ICD-10-CM | POA: Diagnosis not present

## 2016-01-14 NOTE — Progress Notes (Signed)
Pre visit review using our clinic review tool, if applicable. No additional management support is needed unless otherwise documented below in the visit note. 

## 2016-01-14 NOTE — Patient Instructions (Signed)
Verbal and chart was given

## 2016-01-14 NOTE — Assessment & Plan Note (Signed)
Patient is had difficulty with piriformis syndrome. I do think that she has a hypermobile sacroiliac joint against her difficulty. Continues to respond well to manipulation. As long as she does well she can follow-up with me every 6 weeks. Encourage hip abductors. Patient has had difficulty keeping muscle on.

## 2016-01-14 NOTE — Assessment & Plan Note (Signed)
Decision today to treat with OMT was based on Physical Exam  After verbal consent patient was treated with HVLA, ME, FPR techniques in  Cervical, thoracic, lumbar and sacral areas  Patient tolerated the procedure well with improvement in symptoms  Patient given exercises, stretches and lifestyle modifications  See medications in patient instructions if given  Patient will follow up in 6-12 weeks.

## 2016-01-14 NOTE — Progress Notes (Signed)
[name] [place], [place] [address] Phone: [phone] Subjective:     CC: Left hip pain follow up sacroiliac dysfunction follow-up  [id number] Tracey [name] is a 65 y.o. female coming in with complaint of left hip pain. Patient does have more of a piriformis syndrome. Had been doing very well. Went hiking after taking a cruise. Feels that it has been significantly better overall still. Some mild exacerbation when she was doing a lot of hiking but seems to be better when she's been doing the exercises regularly. No radiation the pain. Overall states that she is minorly worse than her baseline.  Past Medical History  Diagnosis Date  . Hypertension   . Osteoporosis     h/o  . Osteopenia   . Skin cancer     multiple skin cancers   Past Surgical History  Procedure Laterality Date  . [place] luc    . Tonsillectomy    . Nasal septum surgery      past hx. deviated septum  . Colonoscopy with propofol N/A 03/22/2014    Procedure: COLONOSCOPY WITH PROPOFOL;  Surgeon: Arta Silence, MD;  Location: [facility];  Service: Endoscopy;  Laterality: N/A;   Social History  Substance Use Topics  . Smoking status: Never Smoker   . Smokeless tobacco: Never Used  . Alcohol Use: 0.0 oz/week    0.25 drink(s) per week   Allergies  Allergen Reactions  . Cefaclor     REACTION: urticaria (hives)  . Neomycin-Bacitracin Zn-Polymyx     REACTION: rash   Family History  Problem Relation Age of Onset  . Hypertension Mother   . Leukemia Father   . Hypertension Father   . Diabetes Brother   . Hypertension Brother   . Hyperlipidemia Maternal Grandmother   . Cancer Maternal Grandfather   . Heart disease Paternal Grandfather   . Stroke Paternal Grandfather     Past medical history, social, surgical and family history all reviewed in electronic medical record.   Review of Systems: No headache, visual changes, nausea, vomiting, diarrhea,  constipation, dizziness, abdominal pain, skin rash, fevers, chills, night sweats, weight loss, swollen lymph nodes, body aches, joint swelling, muscle aches, chest pain, shortness of breath, mood changes.   Objective Blood pressure 128/82, pulse 84, height 5\' 5"  (1.651 m), weight 101 lb (45.813 kg), SpO2 97 %.  General: No apparent distress alert and oriented x3 mood and affect normal, dressed appropriately.  HEENT: Pupils equal, extraocular movements intact  Respiratory: Patient's speak in full sentences and does not appear short of breath  Cardiovascular: No lower extremity edema, non tender, no erythema  Skin: Warm dry intact with no signs of infection or rash on extremities or on axial skeleton.  Abdomen: Soft nontender  Neuro: Cranial nerves II through XII are intact, neurovascularly intact in all extremities with 2+ DTRs and 2+ pulses.  Lymph: No lymphadenopathy of posterior or anterior cervical chain or axillae bilaterally.  Gait normal with good balance and coordination.  MSK:  Non tender with full range of motion and good stability and symmetric strength and tone of shoulders, elbows, wrist,, knee and ankles bilaterally.  Continue minor positive Faber test.  OMT Physical Exam Cervical  C2 flexed rotated and side bent left C5 flexed rotated and side bent right   Thoracic T3-5 neutral position group curve rotated left side bent right T7 extended rotated and side bent left  Lumbar L 2 flexed rotated and side bent right  L4 flexed rotated and side bent right  Sacrum right on right    Impression and Recommendations:     This case required medical decision making of moderate complexity.

## 2016-01-17 MED FILL — AMLODIPINE BESYLATE 5 MG TA: 5 | 90 days supply | Qty: 90 | Fill #2

## 2016-02-08 ENCOUNTER — Ambulatory Visit (INDEPENDENT_AMBULATORY_CARE_PROVIDER_SITE_OTHER): Payer: PPO | Admitting: Family Medicine

## 2016-02-08 ENCOUNTER — Encounter: Payer: Self-pay | Admitting: Family Medicine

## 2016-02-08 VITALS — BP 134/80 | HR 84 | Ht 65.0 in | Wt 104.0 lb

## 2016-02-08 DIAGNOSIS — M999 Biomechanical lesion, unspecified: Secondary | ICD-10-CM

## 2016-02-08 DIAGNOSIS — M9901 Segmental and somatic dysfunction of cervical region: Secondary | ICD-10-CM | POA: Diagnosis not present

## 2016-02-08 DIAGNOSIS — M9904 Segmental and somatic dysfunction of sacral region: Secondary | ICD-10-CM

## 2016-02-08 DIAGNOSIS — M9903 Segmental and somatic dysfunction of lumbar region: Secondary | ICD-10-CM | POA: Diagnosis not present

## 2016-02-08 DIAGNOSIS — M533 Sacrococcygeal disorders, not elsewhere classified: Secondary | ICD-10-CM | POA: Diagnosis not present

## 2016-02-08 DIAGNOSIS — M9902 Segmental and somatic dysfunction of thoracic region: Secondary | ICD-10-CM

## 2016-02-08 NOTE — Progress Notes (Signed)
Pre visit review using our clinic review tool, if applicable. No additional management support is needed unless otherwise documented below in the visit note. 

## 2016-02-08 NOTE — Assessment & Plan Note (Signed)
Patient was having an exacerbation. We discussed icing regimen and home exercises. We discussed which activities to do in which ones to potentially avoid. Patient and will come back and see me again in 3-4 weeks. No significant changes in management.

## 2016-02-08 NOTE — Progress Notes (Signed)
[name] [address] [place], [place] [address] Phone: [phone] Subjective:     CC: Left hip pain follow up sacroiliac dysfunction follow-up  [id number] Tracey [name] is a 65 y.o. female coming in with complaint of left hip pain. Patient is having more of a right-sided sacroiliac joint pain that she's had previously. Seems to be worse. States that it's catching her when she goes from a seated to standing position. It was very difficult to sleep at night. Has not tried any of her medications regularly. Continues to take her gabapentin but did not take any of the muscle relaxers. No radiation down the legs. Very localized.  Past Medical History  Diagnosis Date  . Hypertension   . Osteoporosis     h/o  . Osteopenia   . Skin cancer     multiple skin cancers   Past Surgical History  Procedure Laterality Date  . Caldwell luc    . Tonsillectomy    . Nasal septum surgery      past hx. deviated septum  . Colonoscopy with propofol N/A 03/22/2014    Procedure: COLONOSCOPY WITH PROPOFOL;  Surgeon: Arta Silence, MD;  Location: [facility];  Service: Endoscopy;  Laterality: N/A;   Social History  Substance Use Topics  . Smoking status: Never Smoker   . Smokeless tobacco: Never Used  . Alcohol Use: 0.0 oz/week    0.25 drink(s) per week   Allergies  Allergen Reactions  . Cefaclor     REACTION: urticaria (hives)  . Neomycin-Bacitracin Zn-Polymyx     REACTION: rash   Family History  Problem Relation Age of Onset  . Hypertension Mother   . Leukemia Father   . Hypertension Father   . Diabetes Brother   . Hypertension Brother   . Hyperlipidemia Maternal Grandmother   . Cancer Maternal Grandfather   . Heart disease Paternal Grandfather   . Stroke Paternal Grandfather     Past medical history, social, surgical and family history all reviewed in electronic medical record.   Review of Systems: No headache, visual changes, nausea,  vomiting, diarrhea, constipation, dizziness, abdominal pain, skin rash, fevers, chills, night sweats, weight loss, swollen lymph nodes, body aches, joint swelling, muscle aches, chest pain, shortness of breath, mood changes.   Objective Blood pressure 134/80, pulse 84, height 5\' 5"  (1.651 m), weight 104 lb (47.174 kg), SpO2 97 %.  General: No apparent distress alert and oriented x3 mood and affect normal, dressed appropriately.  HEENT: Pupils equal, extraocular movements intact  Respiratory: Patient's speak in full sentences and does not appear short of breath  Cardiovascular: No lower extremity edema, non tender, no erythema  Skin: Warm dry intact with no signs of infection or rash on extremities or on axial skeleton.  Abdomen: Soft nontender  Neuro: Cranial nerves II through XII are intact, neurovascularly intact in all extremities with 2+ DTRs and 2+ pulses.  Lymph: No lymphadenopathy of posterior or anterior cervical chain or axillae bilaterally.  Gait normal with good balance and coordination.  MSK:  Non tender with full range of motion and good stability and symmetric strength and tone of shoulders, elbows, wrist,, knee and ankles bilaterally.  Back exam shows the patient does have severe pain over the right sacroiliac joint. Positive Faber test on the right side. Mild limitation with flexion secondary to pain. Neurovascular intact distally with negative straight leg test.  OMT Physical Exam Cervical  C2 flexed rotated and side bent left C5 flexed  rotated and side bent right   Thoracic T3 extended rotated and side bent right T7 extended rotated and side bent left  Lumbar L 2 flexed rotated and side bent right L4 flexed rotated and side bent right  Sacrum right on right Right posterior ilium    Impression and Recommendations:     This case required medical decision making of moderate complexity.

## 2016-02-08 NOTE — Patient Instructions (Signed)
Verbal instructions given

## 2016-02-08 NOTE — Assessment & Plan Note (Signed)
Decision today to treat with OMT was based on Physical Exam  After verbal consent patient was treated with HVLA, ME, FPR techniques in  Cervical, thoracic, lumbar and sacral areas  Patient tolerated the procedure well with improvement in symptoms  Patient given exercises, stretches and lifestyle modifications  See medications in patient instructions if given  Patient will follow up in 3-4 weeks.               

## 2016-02-11 ENCOUNTER — Other Ambulatory Visit: Payer: Self-pay

## 2016-02-11 ENCOUNTER — Telehealth: Payer: Self-pay | Admitting: Internal Medicine

## 2016-02-11 DIAGNOSIS — M533 Sacrococcygeal disorders, not elsewhere classified: Secondary | ICD-10-CM

## 2016-02-11 MED ORDER — PREDNISONE 20 MG PO TABS: ORAL_TABLET | ORAL | Status: DC

## 2016-02-11 MED FILL — predniSONE 20 MG TABS: 20 | 5 days supply | Qty: 10 | Fill #0

## 2016-02-11 NOTE — Telephone Encounter (Signed)
She said that she has gotten worse and she can not walk, any suggestions for her.  Can you call her when you get a chance   Best number [phone]

## 2016-02-11 NOTE — Telephone Encounter (Signed)
I am sorry to hear that  Lets do prednisone daily for  5 days.  Heat 20 minutes, ice 20 minutes then off 20 minutes. Repeat as much as she wants.  See me again first available in 1 week

## 2016-02-11 NOTE — Telephone Encounter (Signed)
Spoke with patient. Sending in steroids, patient will be seen on Thursday.

## 2016-02-13 DIAGNOSIS — N952 Postmenopausal atrophic vaginitis: Secondary | ICD-10-CM | POA: Diagnosis not present

## 2016-02-13 DIAGNOSIS — Z681 Body mass index (BMI) 19 or less, adult: Secondary | ICD-10-CM | POA: Diagnosis not present

## 2016-02-13 DIAGNOSIS — Z01419 Encounter for gynecological examination (general) (routine) without abnormal findings: Secondary | ICD-10-CM | POA: Diagnosis not present

## 2016-02-13 DIAGNOSIS — N368 Other specified disorders of urethra: Secondary | ICD-10-CM | POA: Diagnosis not present

## 2016-02-13 DIAGNOSIS — M810 Age-related osteoporosis without current pathological fracture: Secondary | ICD-10-CM | POA: Diagnosis not present

## 2016-02-14 ENCOUNTER — Encounter: Payer: Self-pay | Admitting: Family Medicine

## 2016-02-14 ENCOUNTER — Ambulatory Visit (INDEPENDENT_AMBULATORY_CARE_PROVIDER_SITE_OTHER): Payer: PPO | Admitting: Family Medicine

## 2016-02-14 VITALS — BP 124/74 | HR 88 | Wt 104.0 lb

## 2016-02-14 DIAGNOSIS — M9904 Segmental and somatic dysfunction of sacral region: Secondary | ICD-10-CM

## 2016-02-14 DIAGNOSIS — M810 Age-related osteoporosis without current pathological fracture: Secondary | ICD-10-CM

## 2016-02-14 DIAGNOSIS — M9903 Segmental and somatic dysfunction of lumbar region: Secondary | ICD-10-CM

## 2016-02-14 DIAGNOSIS — M999 Biomechanical lesion, unspecified: Secondary | ICD-10-CM

## 2016-02-14 DIAGNOSIS — B370 Candidal stomatitis: Secondary | ICD-10-CM | POA: Diagnosis not present

## 2016-02-14 DIAGNOSIS — M533 Sacrococcygeal disorders, not elsewhere classified: Secondary | ICD-10-CM | POA: Diagnosis not present

## 2016-02-14 DIAGNOSIS — M9901 Segmental and somatic dysfunction of cervical region: Secondary | ICD-10-CM

## 2016-02-14 DIAGNOSIS — M9902 Segmental and somatic dysfunction of thoracic region: Secondary | ICD-10-CM

## 2016-02-14 MED ORDER — FLUCONAZOLE 200 MG PO TABS: 200.0000 mg | ORAL_TABLET | Freq: Every day | ORAL | Status: DC

## 2016-02-14 MED FILL — FLUCONAZOLE 200 MG TABLET: 200 | 1 days supply | Qty: 1 | Fill #0

## 2016-02-14 NOTE — Assessment & Plan Note (Signed)
Patient does have more of a sacroiliac dysfunction and patient did have recently a muscle spasm. We discussed action plan for next him she has 1. Patient is going to try iron supplementation to see if this will be beneficial. We discussed icing regimen. Patient continues to have the spasms we may need to consider labs including checking for her iron, vitamin D and other inflammatory markers. Patient will be coming back to see me again in 2-3 weeks for further evaluation.

## 2016-02-14 NOTE — Assessment & Plan Note (Signed)
Decision today to treat with OMT was based on Physical Exam  After verbal consent patient was treated with HVLA, ME, FPR techniques in  Cervical, thoracic, lumbar and sacral areas  Patient tolerated the procedure well with improvement in symptoms  Patient given exercises, stretches and lifestyle modifications  See medications in patient instructions if given  Patient will follow up in 3-4 weeks.               

## 2016-02-14 NOTE — Progress Notes (Signed)
[name] [address], [place] [address] Phone: [phone] Subjective:     CC: Left hip pain follow up sacroiliac dysfunction follow-up  [id number] Tracey [name] is a 65 y.o. female coming in with complaint of left hip pain. Patient is having more of a right-sided sacroiliac joint pain that she's had previously. Patient and forcefully had a flare on Friday. Started having worsening symptoms. Patient states that she was at a very difficulty we can when she was in bed most of the weakened. Was taking Valium very regularly. We did: Prednisone to see if this will be helpful. Patient states that she is doing approximate 60-70% better now. No radiation down the legs and any point. Feels like it was all muscle spasm. Occurred initially when she was doing rotation and never seemed to let go. Denies any weakness of the large obese. Denies any bowel or bladder incontinence.  Past Medical History  Diagnosis Date  . Hypertension   . Osteoporosis     h/o  . Osteopenia   . Skin cancer     multiple skin cancers   Past Surgical History  Procedure Laterality Date  . Caldwell luc    . Tonsillectomy    . Nasal septum surgery      past hx. deviated septum  . Colonoscopy with propofol N/A 03/22/2014    Procedure: COLONOSCOPY WITH PROPOFOL;  Surgeon: Arta Silence, MD;  Location: [facility];  Service: Endoscopy;  Laterality: N/A;   Social History  Substance Use Topics  . Smoking status: Never Smoker   . Smokeless tobacco: Never Used  . Alcohol Use: 0.0 oz/week    0.25 drink(s) per week   Allergies  Allergen Reactions  . Cefaclor     REACTION: urticaria (hives)  . Neomycin-Bacitracin Zn-Polymyx     REACTION: rash   Family History  Problem Relation Age of Onset  . Hypertension Mother   . Leukemia Father   . Hypertension Father   . Diabetes Brother   . Hypertension Brother   . Hyperlipidemia Maternal Grandmother   . Cancer Maternal  Grandfather   . Heart disease Paternal Grandfather   . Stroke Paternal Grandfather     Past medical history, social, surgical and family history all reviewed in electronic medical record.   Review of Systems: No headache, visual changes, nausea, vomiting, diarrhea, constipation, dizziness, abdominal pain, skin rash, fevers, chills, night sweats, weight loss, swollen lymph nodes, body aches, joint swelling, muscle aches, chest pain, shortness of breath, mood changes.   Objective Blood pressure 124/74, pulse 88, weight 104 lb (47.174 kg), SpO2 99 %.  General: No apparent distress alert and oriented x3 mood and affect normal, dressed appropriately.  HEENT: Pupils equal, extraocular movements intact  Respiratory: Patient's speak in full sentences and does not appear short of breath  Cardiovascular: No lower extremity edema, non tender, no erythema  Skin: Warm dry intact with no signs of infection or rash on extremities or on axial skeleton.  Abdomen: Soft nontender  Neuro: Cranial nerves II through XII are intact, neurovascularly intact in all extremities with 2+ DTRs and 2+ pulses.  Lymph: No lymphadenopathy of posterior or anterior cervical chain or axillae bilaterally.  Gait normal with good balance and coordination.  MSK:  Non tender with full range of motion and good stability and symmetric strength and tone of shoulders, elbows, wrist,, knee and ankles bilaterally.  Back exam shows the patient does have severe pain over the right sacroiliac  joint. Patient does have more of a muscle spasm of the paraspinal musculature of the lumbar spine on the right side. This is new from previous exam. Still has positive Corky Sox. Full range of motion and strength of the hip and leg distally with 2+ DTRs.  OMT Physical Exam Cervical  C2 flexed rotated and side bent left C5 flexed rotated and side bent right   Thoracic T3 extended rotated and side bent right T7 extended rotated and side bent  left  Lumbar L 2 flexed rotated and side bent right L3 flexed rotated and side bent left L4 flexed rotated and side bent right  Sacrum right on right    Impression and Recommendations:     This case required medical decision making of moderate complexity.

## 2016-02-14 NOTE — Assessment & Plan Note (Signed)
Increased vitamin D 2 2000 units daily

## 2016-02-14 NOTE — Patient Instructions (Signed)
Good to see you  Lets try  Iron 65mg  with 500mg  of vitamin C daily or 3 times a week.  Increase vitamin D to 2000 IU daily  Start thoses exercises from the therapist tomorrow and do them at least 3 times a week.  If a flare occurs take valium 2 times daily , flexeril at night and ibuprofen 600mg  3 times a day  Diflucan one time now See me again when you are scheduled.

## 2016-02-28 MED FILL — FOLIC ACID 1 MG TABLET: 1 | 90 days supply | Qty: 90 | Fill #3

## 2016-03-01 DIAGNOSIS — S72009A Fracture of unspecified part of neck of unspecified femur, initial encounter for closed fracture: Secondary | ICD-10-CM

## 2016-03-01 HISTORY — DX: Fracture of unspecified part of neck of unspecified femur, initial encounter for closed fracture: S72.009A

## 2016-03-06 ENCOUNTER — Encounter: Payer: Self-pay | Admitting: Family Medicine

## 2016-03-06 ENCOUNTER — Ambulatory Visit (INDEPENDENT_AMBULATORY_CARE_PROVIDER_SITE_OTHER): Payer: PPO | Admitting: Family Medicine

## 2016-03-06 VITALS — BP 116/76 | HR 74 | Wt 104.0 lb

## 2016-03-06 DIAGNOSIS — M533 Sacrococcygeal disorders, not elsewhere classified: Secondary | ICD-10-CM

## 2016-03-06 DIAGNOSIS — M9904 Segmental and somatic dysfunction of sacral region: Secondary | ICD-10-CM

## 2016-03-06 DIAGNOSIS — M9903 Segmental and somatic dysfunction of lumbar region: Secondary | ICD-10-CM

## 2016-03-06 DIAGNOSIS — M999 Biomechanical lesion, unspecified: Secondary | ICD-10-CM

## 2016-03-06 DIAGNOSIS — M9901 Segmental and somatic dysfunction of cervical region: Secondary | ICD-10-CM | POA: Diagnosis not present

## 2016-03-06 DIAGNOSIS — M9902 Segmental and somatic dysfunction of thoracic region: Secondary | ICD-10-CM

## 2016-03-06 NOTE — Progress Notes (Signed)
[name] [address] [place], [place] [address] Phone: [phone] Subjective:     CC: Left hip pain follow up sacroiliac dysfunction follow-up  [id number] Tracey [name] is a 65 y.o. female coming in with complaint of left hip pain. Patient is having more of a right-sided sacroiliac joint pain that she's had previously.patient states that whenever she does something in a flexed position such as riding her bicycle her being in the garden she has severe pain over the sacroiliac joints bilaterally now. States that it seems to be more of a stiffness. Worse when she goes from a seated to standing position. When she is standing she does fine. No significant pain with walking. Denies any significant pain at night. Just more of a soreness seems to continue. Denies any radiation down the legs any numbness or tingling. Patient does have severe osteoporosis.  Past Medical History  Diagnosis Date  . Hypertension   . Osteoporosis     h/o  . Osteopenia   . Skin cancer     multiple skin cancers   Past Surgical History  Procedure Laterality Date  . Caldwell luc    . Tonsillectomy    . Nasal septum surgery      past hx. deviated septum  . Colonoscopy with propofol N/A 03/22/2014    Procedure: COLONOSCOPY WITH PROPOFOL;  Surgeon: Arta Silence, MD;  Location: [facility];  Service: Endoscopy;  Laterality: N/A;   Social History  Substance Use Topics  . Smoking status: Never Smoker   . Smokeless tobacco: Never Used  . Alcohol Use: 0.0 oz/week    0.25 drink(s) per week   Allergies  Allergen Reactions  . Cefaclor     REACTION: urticaria (hives)  . Neomycin-Bacitracin Zn-Polymyx     REACTION: rash   Family History  Problem Relation Age of Onset  . Hypertension Mother   . Leukemia Father   . Hypertension Father   . Diabetes Brother   . Hypertension Brother   . Hyperlipidemia Maternal Grandmother   . Cancer Maternal Grandfather   . Heart disease  Paternal Grandfather   . Stroke Paternal Grandfather     Past medical history, social, surgical and family history all reviewed in electronic medical record.   Review of Systems: No headache, visual changes, nausea, vomiting, diarrhea, constipation, dizziness, abdominal pain, skin rash, fevers, chills, night sweats, weight loss, swollen lymph nodes, body aches, joint swelling, muscle aches, chest pain, shortness of breath, mood changes.   Objective Blood pressure 116/76, pulse 74, weight 104 lb (47.174 kg).  General: No apparent distress alert and oriented x3 mood and affect normal, dressed appropriately.  HEENT: Pupils equal, extraocular movements intact  Respiratory: Patient's speak in full sentences and does not appear short of breath  Cardiovascular: No lower extremity edema, non tender, no erythema  Skin: Warm dry intact with no signs of infection or rash on extremities or on axial skeleton.  Abdomen: Soft nontender  Neuro: Cranial nerves II through XII are intact, neurovascularly intact in all extremities with 2+ DTRs and 2+ pulses.  Lymph: No lymphadenopathy of posterior or anterior cervical chain or axillae bilaterally.  Gait normal with good balance and coordination.  MSK:  Non tender with full range of motion and good stability and symmetric strength and tone of shoulders, elbows, wrist,, knee and ankles bilaterally.  Back exam shows the patient still has tenderness over the sacroiliac joint but more bilateral. Positive Faber bilaterally. Lacks last 5 of flexion  as well as 5 of extension. Negative straight leg test bilaterally.  OMT Physical Exam Cervical  C2 flexed rotated and side bent left C6 flexed rotated and side bent right   Thoracic T3 extended rotated and side bent right T5 extended rotated and side bent left  Lumbar L 2 flexed rotated and side bent right L4 flexed rotated and side bent right  Sacrum right on right    Impression and Recommendations:      This case required medical decision making of moderate complexity.

## 2016-03-06 NOTE — Assessment & Plan Note (Signed)
Continues to have difficulty. I do think that this is more musculature in nature and patient having tight hip flexors. Encourage her to do stretching more after some of these activities. Strengthening only 3 times a week. Encourage patient to potentially gain weight and heat appropriately. Patient will continue with the high-dose vitamin D supplementation. Has topical anti-inflammatories for breakthrough pain. We've also given her prednisone previously in the past but I do not think that another bout would be helpful and I went to avoid it secondary to the osteoporosis. Patient and will come back and see me again in 4 weeks. If any worsening symptoms imaging may be necessary.

## 2016-03-06 NOTE — Assessment & Plan Note (Signed)
Decision today to treat with OMT was based on Physical Exam  After verbal consent patient was treated with HVLA, ME, FPR techniques in  Cervical, thoracic, lumbar and sacral areas  Patient tolerated the procedure well with improvement in symptoms  Patient given exercises, stretches and lifestyle modifications  See medications in patient instructions if given  Patient will follow up in 3-4 weeks.               

## 2016-03-06 NOTE — Patient Instructions (Signed)
Good to see you  Do the stretching exercises after gardening and biking Strength exercises 3 times a week.  We need to keep at it.  See me again in 4 weeks.  If worsening we will need to consider imaging.

## 2016-03-18 DIAGNOSIS — M549 Dorsalgia, unspecified: Secondary | ICD-10-CM | POA: Diagnosis not present

## 2016-03-18 DIAGNOSIS — H04129 Dry eye syndrome of unspecified lacrimal gland: Secondary | ICD-10-CM | POA: Diagnosis not present

## 2016-03-18 DIAGNOSIS — M818 Other osteoporosis without current pathological fracture: Secondary | ICD-10-CM | POA: Diagnosis not present

## 2016-03-18 DIAGNOSIS — M810 Age-related osteoporosis without current pathological fracture: Secondary | ICD-10-CM | POA: Diagnosis not present

## 2016-03-18 DIAGNOSIS — R682 Dry mouth, unspecified: Secondary | ICD-10-CM | POA: Diagnosis not present

## 2016-03-20 ENCOUNTER — Emergency Department (HOSPITAL_COMMUNITY): Payer: PPO

## 2016-03-20 ENCOUNTER — Inpatient Hospital Stay (HOSPITAL_COMMUNITY): Payer: PPO

## 2016-03-20 ENCOUNTER — Encounter (HOSPITAL_COMMUNITY): Payer: Self-pay | Admitting: Emergency Medicine

## 2016-03-20 ENCOUNTER — Inpatient Hospital Stay (HOSPITAL_COMMUNITY)
Admission: EM | Admit: 2016-03-20 | Discharge: 2016-03-23 | DRG: 481 | Disposition: A | Discharge status: Home Health | Payer: PPO | Attending: Internal Medicine | Admitting: Internal Medicine

## 2016-03-20 DIAGNOSIS — I10 Essential (primary) hypertension: Secondary | ICD-10-CM | POA: Diagnosis not present

## 2016-03-20 DIAGNOSIS — Z01818 Encounter for other preprocedural examination: Secondary | ICD-10-CM | POA: Diagnosis not present

## 2016-03-20 DIAGNOSIS — M810 Age-related osteoporosis without current pathological fracture: Secondary | ICD-10-CM | POA: Diagnosis present

## 2016-03-20 DIAGNOSIS — S72001A Fracture of unspecified part of neck of right femur, initial encounter for closed fracture: Secondary | ICD-10-CM

## 2016-03-20 DIAGNOSIS — K589 Irritable bowel syndrome without diarrhea: Secondary | ICD-10-CM | POA: Diagnosis present

## 2016-03-20 DIAGNOSIS — Z79899 Other long term (current) drug therapy: Secondary | ICD-10-CM

## 2016-03-20 DIAGNOSIS — R636 Underweight: Secondary | ICD-10-CM | POA: Diagnosis not present

## 2016-03-20 DIAGNOSIS — Z01811 Encounter for preprocedural respiratory examination: Secondary | ICD-10-CM

## 2016-03-20 DIAGNOSIS — S72011A Unspecified intracapsular fracture of right femur, initial encounter for closed fracture: Principal | ICD-10-CM | POA: Diagnosis present

## 2016-03-20 DIAGNOSIS — D62 Acute posthemorrhagic anemia: Secondary | ICD-10-CM | POA: Diagnosis not present

## 2016-03-20 DIAGNOSIS — Z681 Body mass index (BMI) 19 or less, adult: Secondary | ICD-10-CM

## 2016-03-20 DIAGNOSIS — M8580 Other specified disorders of bone density and structure, unspecified site: Secondary | ICD-10-CM | POA: Diagnosis not present

## 2016-03-20 DIAGNOSIS — Y9355 Activity, bike riding: Secondary | ICD-10-CM

## 2016-03-20 DIAGNOSIS — Z7982 Long term (current) use of aspirin: Secondary | ICD-10-CM

## 2016-03-20 DIAGNOSIS — D6959 Other secondary thrombocytopenia: Secondary | ICD-10-CM | POA: Diagnosis not present

## 2016-03-20 DIAGNOSIS — S72001D Fracture of unspecified part of neck of right femur, subsequent encounter for closed fracture with routine healing: Secondary | ICD-10-CM | POA: Diagnosis not present

## 2016-03-20 DIAGNOSIS — F411 Generalized anxiety disorder: Secondary | ICD-10-CM | POA: Diagnosis not present

## 2016-03-20 DIAGNOSIS — Z881 Allergy status to other antibiotic agents status: Secondary | ICD-10-CM

## 2016-03-20 DIAGNOSIS — M79604 Pain in right leg: Secondary | ICD-10-CM

## 2016-03-20 DIAGNOSIS — S80211A Abrasion, right knee, initial encounter: Secondary | ICD-10-CM | POA: Diagnosis not present

## 2016-03-20 DIAGNOSIS — S8991XA Unspecified injury of right lower leg, initial encounter: Secondary | ICD-10-CM | POA: Diagnosis not present

## 2016-03-20 DIAGNOSIS — Z419 Encounter for procedure for purposes other than remedying health state, unspecified: Secondary | ICD-10-CM

## 2016-03-20 DIAGNOSIS — D696 Thrombocytopenia, unspecified: Secondary | ICD-10-CM | POA: Diagnosis not present

## 2016-03-20 DIAGNOSIS — Z09 Encounter for follow-up examination after completed treatment for conditions other than malignant neoplasm: Secondary | ICD-10-CM

## 2016-03-20 DIAGNOSIS — S72041A Displaced fracture of base of neck of right femur, initial encounter for closed fracture: Secondary | ICD-10-CM | POA: Diagnosis not present

## 2016-03-20 HISTORY — DX: Fracture of unspecified part of neck of unspecified femur, initial encounter for closed fracture: S72.009A

## 2016-03-20 LAB — CBC WITH DIFFERENTIAL/PLATELET
BASOS ABS: 0 10*3/uL (ref 0.0–0.1)
Basophils Relative: 0 %
Eosinophils Absolute: 0.1 10*3/uL (ref 0.0–0.7)
Eosinophils Relative: 1 %
HEMATOCRIT: 41.1 % (ref 36.0–46.0)
Hemoglobin: 13.5 g/dL (ref 12.0–15.0)
LYMPHS PCT: 13 %
Lymphs Abs: 1.3 10*3/uL (ref 0.7–4.0)
MCH: 27.8 pg (ref 26.0–34.0)
MCHC: 32.8 g/dL (ref 30.0–36.0)
MCV: 84.6 fL (ref 78.0–100.0)
Monocytes Absolute: 0.7 10*3/uL (ref 0.1–1.0)
Monocytes Relative: 6 %
NEUTROS ABS: 8.3 10*3/uL — AB (ref 1.7–7.7)
Neutrophils Relative %: 80 %
PLATELETS: 163 10*3/uL (ref 150–400)
RBC: 4.86 MIL/uL (ref 3.87–5.11)
RDW: 13.2 % (ref 11.5–15.5)
WBC: 10.4 10*3/uL (ref 4.0–10.5)

## 2016-03-20 LAB — COMPREHENSIVE METABOLIC PANEL
ALBUMIN: 4.2 g/dL (ref 3.5–5.0)
ALT: 26 U/L (ref 14–54)
ANION GAP: 14 (ref 5–15)
AST: 35 U/L (ref 15–41)
Alkaline Phosphatase: 50 U/L (ref 38–126)
BILIRUBIN TOTAL: 0.9 mg/dL (ref 0.3–1.2)
BUN: 16 mg/dL (ref 6–20)
CO2: 22 mmol/L (ref 22–32)
Calcium: 9.7 mg/dL (ref 8.9–10.3)
Chloride: 103 mmol/L (ref 101–111)
Creatinine, Ser: 1 mg/dL (ref 0.44–1.00)
GFR calc non Af Amer: 57 mL/min — ABNORMAL LOW (ref >60)
GLUCOSE: 98 mg/dL (ref 65–99)
POTASSIUM: 3.7 mmol/L (ref 3.5–5.1)
SODIUM: 139 mmol/L (ref 135–145)
TOTAL PROTEIN: 7.3 g/dL (ref 6.5–8.1)

## 2016-03-20 LAB — URINALYSIS, ROUTINE W REFLEX MICROSCOPIC
Bilirubin Urine: NEGATIVE
GLUCOSE, UA: NEGATIVE mg/dL
Hgb urine dipstick: NEGATIVE
Ketones, ur: 15 mg/dL — AB
NITRITE: NEGATIVE
PROTEIN: NEGATIVE mg/dL
Specific Gravity, Urine: 1.008 (ref 1.005–1.030)
pH: 8 (ref 5.0–8.0)

## 2016-03-20 LAB — PROTIME-INR
INR: 1.07 (ref 0.00–1.49)
Prothrombin Time: 14.1 seconds (ref 11.6–15.2)

## 2016-03-20 LAB — TYPE AND SCREEN
ABO/RH(D): O POS
ANTIBODY SCREEN: NEGATIVE

## 2016-03-20 LAB — URINE MICROSCOPIC-ADD ON

## 2016-03-20 LAB — ABO/RH: ABO/RH(D): O POS

## 2016-03-20 MED ORDER — ONDANSETRON HCL 4 MG/2ML IJ SOLN
4.0000 mg | Freq: Three times a day (TID) | INTRAMUSCULAR | Status: DC | PRN
  Administered 2016-03-20: 4 mg via INTRAVENOUS
  Filled 2016-03-20: qty 2

## 2016-03-20 MED ORDER — MORPHINE SULFATE (PF) 4 MG/ML IV SOLN
4.0000 mg | INTRAVENOUS | Status: AC | PRN
  Administered 2016-03-20 – 2016-03-21 (×2): 4 mg via INTRAVENOUS
  Filled 2016-03-20 (×2): qty 1

## 2016-03-20 MED ORDER — SODIUM CHLORIDE 0.9 % IV SOLN
INTRAVENOUS | Status: DC
  Administered 2016-03-20: 22:00:00 via INTRAVENOUS
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
## 2016-03-20 NOTE — ED Notes (Signed)
Ortho at bedside.
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
## 2016-03-21 ENCOUNTER — Inpatient Hospital Stay (HOSPITAL_COMMUNITY): Payer: PPO

## 2016-03-21 ENCOUNTER — Inpatient Hospital Stay (HOSPITAL_COMMUNITY): Payer: PPO | Admitting: Anesthesiology

## 2016-03-21 ENCOUNTER — Encounter (HOSPITAL_COMMUNITY)
Admission: EM | Disposition: A | Discharge status: Home Health | Payer: Self-pay | Source: Home / Self Care | Attending: Internal Medicine

## 2016-03-21 ENCOUNTER — Encounter (HOSPITAL_COMMUNITY): Payer: Self-pay | Admitting: Family Medicine

## 2016-03-21 DIAGNOSIS — S72041A Displaced fracture of base of neck of right femur, initial encounter for closed fracture: Secondary | ICD-10-CM | POA: Diagnosis not present

## 2016-03-21 DIAGNOSIS — D6959 Other secondary thrombocytopenia: Secondary | ICD-10-CM | POA: Diagnosis not present

## 2016-03-21 DIAGNOSIS — F411 Generalized anxiety disorder: Secondary | ICD-10-CM

## 2016-03-21 DIAGNOSIS — M810 Age-related osteoporosis without current pathological fracture: Secondary | ICD-10-CM | POA: Diagnosis not present

## 2016-03-21 DIAGNOSIS — Z681 Body mass index (BMI) 19 or less, adult: Secondary | ICD-10-CM | POA: Diagnosis not present

## 2016-03-21 DIAGNOSIS — S72011A Unspecified intracapsular fracture of right femur, initial encounter for closed fracture: Secondary | ICD-10-CM | POA: Diagnosis not present

## 2016-03-21 DIAGNOSIS — I10 Essential (primary) hypertension: Secondary | ICD-10-CM | POA: Diagnosis present

## 2016-03-21 DIAGNOSIS — S72001A Fracture of unspecified part of neck of right femur, initial encounter for closed fracture: Secondary | ICD-10-CM | POA: Diagnosis not present

## 2016-03-21 DIAGNOSIS — S72001D Fracture of unspecified part of neck of right femur, subsequent encounter for closed fracture with routine healing: Secondary | ICD-10-CM

## 2016-03-21 DIAGNOSIS — D62 Acute posthemorrhagic anemia: Secondary | ICD-10-CM | POA: Diagnosis not present

## 2016-03-21 DIAGNOSIS — M8580 Other specified disorders of bone density and structure, unspecified site: Secondary | ICD-10-CM | POA: Insufficient documentation

## 2016-03-21 HISTORY — PX: HIP PINNING,CANNULATED: SHX1758

## 2016-03-21 LAB — SURGICAL PCR SCREEN
MRSA, PCR: NEGATIVE
STAPHYLOCOCCUS AUREUS: NEGATIVE

## 2016-03-21 LAB — CREATININE, SERUM
Creatinine, Ser: 0.99 mg/dL (ref 0.44–1.00)
GFR calc Af Amer: >60 mL/min (ref >60)
GFR calc non Af Amer: 58 mL/min — ABNORMAL LOW (ref >60)

## 2016-03-21 LAB — CBC
HCT: 33.3 % — ABNORMAL LOW (ref 36.0–46.0)
HEMOGLOBIN: 11.1 g/dL — AB (ref 12.0–15.0)
MCH: 28.8 pg (ref 26.0–34.0)
MCHC: 33.3 g/dL (ref 30.0–36.0)
MCV: 86.5 fL (ref 78.0–100.0)
Platelets: 93 10*3/uL — ABNORMAL LOW (ref 150–400)
RBC: 3.85 MIL/uL — AB (ref 3.87–5.11)
RDW: 13.6 % (ref 11.5–15.5)
WBC: 4.8 10*3/uL (ref 4.0–10.5)

## 2016-03-21 LAB — APTT: APTT: 35 s (ref 24–37)

## 2016-03-21 SURGERY — FIXATION, FEMUR, NECK, PERCUTANEOUS, USING SCREW
Anesthesia: General | Site: Hip | Laterality: Right

## 2016-03-21 MED ORDER — CLINDAMYCIN PHOSPHATE 600 MG/50ML IV SOLN
600.0000 mg | Freq: Four times a day (QID) | INTRAVENOUS | Status: AC
  Administered 2016-03-21 – 2016-03-22 (×2): 600 mg via INTRAVENOUS
  Filled 2016-03-21 (×2): qty 50

## 2016-03-21 MED ORDER — POVIDONE-IODINE 10 % EX SWAB: 2.0000 "application " | Freq: Once | CUTANEOUS | Status: DC

## 2016-03-21 MED ORDER — METHOCARBAMOL 500 MG PO TABS
500.0000 mg | ORAL_TABLET | Freq: Four times a day (QID) | ORAL | Status: DC | PRN
  Administered 2016-03-21 – 2016-03-22 (×4): 500 mg via ORAL
  Filled 2016-03-21 (×5): qty 1

## 2016-03-21 MED ORDER — FERROUS SULFATE 325 (65 FE) MG PO TABS
325.0000 mg | ORAL_TABLET | Freq: Every day | ORAL | Status: DC
  Administered 2016-03-22 – 2016-03-23 (×2): 325 mg via ORAL
  Filled 2016-03-21 (×2): qty 1

## 2016-03-21 MED ORDER — PROPOFOL 10 MG/ML IV BOLUS
INTRAVENOUS | Status: DC | PRN
  Administered 2016-03-21: 100 mg via INTRAVENOUS

## 2016-03-21 MED ORDER — ROCURONIUM BROMIDE 100 MG/10ML IV SOLN
INTRAVENOUS | Status: DC | PRN
  Administered 2016-03-21: 40 mg via INTRAVENOUS

## 2016-03-21 MED ORDER — POLYETHYLENE GLYCOL 3350 17 G PO PACK: 17.0000 g | PACK | Freq: Every day | ORAL | Status: DC | PRN

## 2016-03-21 MED ORDER — ADULT MULTIVITAMIN W/MINERALS CH
1.0000 | ORAL_TABLET | Freq: Every day | ORAL | Status: DC
  Administered 2016-03-22 – 2016-03-23 (×2): 1 via ORAL
  Filled 2016-03-21 (×2): qty 1

## 2016-03-21 MED ORDER — PHENYLEPHRINE HCL 10 MG/ML IJ SOLN
10.0000 mg | INTRAVENOUS | Status: DC | PRN
  Administered 2016-03-21: 40 ug/min via INTRAVENOUS

## 2016-03-21 MED ORDER — 0.9 % SODIUM CHLORIDE (POUR BTL) OPTIME
TOPICAL | Status: DC | PRN
  Administered 2016-03-21: 1000 mL

## 2016-03-21 MED ORDER — OMEGA-3-ACID ETHYL ESTERS 1 G PO CAPS
1.0000 g | ORAL_CAPSULE | Freq: Every day | ORAL | Status: DC
  Administered 2016-03-22 – 2016-03-23 (×2): 1 g via ORAL
  Filled 2016-03-21 (×2): qty 1

## 2016-03-21 MED ORDER — BOOST / RESOURCE BREEZE PO LIQD
1.0000 | Freq: Two times a day (BID) | ORAL | Status: DC
  Administered 2016-03-22: 1 via ORAL

## 2016-03-21 MED ORDER — CYCLOBENZAPRINE HCL 10 MG PO TABS
5.0000 mg | ORAL_TABLET | Freq: Every day | ORAL | Status: DC
  Administered 2016-03-21 – 2016-03-22 (×3): 5 mg via ORAL
  Filled 2016-03-21 (×3): qty 1

## 2016-03-21 MED ORDER — LACTATED RINGERS IV SOLN
INTRAVENOUS | Status: DC
  Administered 2016-03-21 (×2): via INTRAVENOUS

## 2016-03-21 MED ORDER — SUGAMMADEX SODIUM 200 MG/2ML IV SOLN
INTRAVENOUS | Status: AC
  Filled 2016-03-21: qty 2

## 2016-03-21 MED ORDER — ACETAMINOPHEN 325 MG PO TABS
650.0000 mg | ORAL_TABLET | Freq: Four times a day (QID) | ORAL | Status: DC | PRN
  Administered 2016-03-22: 650 mg via ORAL
  Filled 2016-03-21: qty 2

## 2016-03-21 MED ORDER — PHENYLEPHRINE HCL 10 MG/ML IJ SOLN
INTRAMUSCULAR | Status: DC | PRN
Start: 2016-03-21 — End: 2016-03-21
  Administered 2016-03-21 (×5): 80 ug via INTRAVENOUS

## 2016-03-21 MED ORDER — AMLODIPINE BESYLATE 5 MG PO TABS
5.0000 mg | ORAL_TABLET | Freq: Every evening | ORAL | Status: DC
  Administered 2016-03-21 – 2016-03-22 (×3): 5 mg via ORAL
  Filled 2016-03-21 (×3): qty 1

## 2016-03-21 MED ORDER — HYDROCODONE-ACETAMINOPHEN 5-325 MG PO TABS
1.0000 | ORAL_TABLET | Freq: Four times a day (QID) | ORAL | Status: DC | PRN
  Administered 2016-03-21 (×2): 2 via ORAL
  Filled 2016-03-21 (×2): qty 2

## 2016-03-21 MED ORDER — METOCLOPRAMIDE HCL 5 MG PO TABS: 5.0000 mg | ORAL_TABLET | Freq: Three times a day (TID) | ORAL | Status: DC | PRN

## 2016-03-21 MED ORDER — MIDAZOLAM HCL 5 MG/5ML IJ SOLN
INTRAMUSCULAR | Status: DC | PRN
  Administered 2016-03-21: 1 mg via INTRAVENOUS

## 2016-03-21 MED ORDER — METOCLOPRAMIDE HCL 5 MG/ML IJ SOLN: 5.0000 mg | Freq: Three times a day (TID) | INTRAMUSCULAR | Status: DC | PRN

## 2016-03-21 MED ORDER — HYDROMORPHONE HCL 1 MG/ML IJ SOLN
INTRAMUSCULAR | Status: AC
  Filled 2016-03-21: qty 1

## 2016-03-21 MED ORDER — MENTHOL 3 MG MT LOZG: 1.0000 | LOZENGE | OROMUCOSAL | Status: DC | PRN

## 2016-03-21 MED ORDER — MORPHINE SULFATE (PF) 2 MG/ML IV SOLN: 0.5000 mg | INTRAVENOUS | Status: DC | PRN

## 2016-03-21 MED ORDER — CHLORHEXIDINE GLUCONATE 4 % EX LIQD
60.0000 mL | Freq: Once | CUTANEOUS | Status: AC
  Administered 2016-03-21: 4 via TOPICAL
  Filled 2016-03-21: qty 60

## 2016-03-21 MED ORDER — SUGAMMADEX SODIUM 200 MG/2ML IV SOLN
INTRAVENOUS | Status: DC | PRN
  Administered 2016-03-21: 186.8 mg via INTRAVENOUS

## 2016-03-21 MED ORDER — HYDROCODONE-ACETAMINOPHEN 7.5-325 MG PO TABS: 1.0000 | ORAL_TABLET | Freq: Once | ORAL | Status: DC | PRN

## 2016-03-21 MED ORDER — LORAZEPAM 2 MG/ML IJ SOLN: 0.5000 mg | INTRAMUSCULAR | Status: DC | PRN

## 2016-03-21 MED ORDER — BISACODYL 5 MG PO TBEC: 5.0000 mg | DELAYED_RELEASE_TABLET | Freq: Every day | ORAL | Status: DC | PRN

## 2016-03-21 MED ORDER — ONDANSETRON HCL 4 MG/2ML IJ SOLN
INTRAMUSCULAR | Status: DC | PRN
  Administered 2016-03-21: 4 mg via INTRAVENOUS

## 2016-03-21 MED ORDER — HEPARIN SODIUM (PORCINE) 5000 UNIT/ML IJ SOLN
5000.0000 [IU] | Freq: Three times a day (TID) | INTRAMUSCULAR | Status: DC
  Administered 2016-03-21: 5000 [IU] via SUBCUTANEOUS
  Filled 2016-03-21: qty 1

## 2016-03-21 MED ORDER — MORPHINE SULFATE (PF) 2 MG/ML IV SOLN
0.5000 mg | INTRAVENOUS | Status: DC | PRN
  Administered 2016-03-22: 0.5 mg via INTRAVENOUS
  Filled 2016-03-21 (×2): qty 1

## 2016-03-21 MED ORDER — MIDAZOLAM HCL 2 MG/2ML IJ SOLN
INTRAMUSCULAR | Status: AC
  Filled 2016-03-21: qty 2

## 2016-03-21 MED ORDER — FENTANYL CITRATE (PF) 100 MCG/2ML IJ SOLN
INTRAMUSCULAR | Status: DC | PRN
  Administered 2016-03-21: 100 ug via INTRAVENOUS
  Administered 2016-03-21: 50 ug via INTRAVENOUS

## 2016-03-21 MED ORDER — ONDANSETRON HCL 4 MG PO TABS: 4.0000 mg | ORAL_TABLET | Freq: Four times a day (QID) | ORAL | Status: DC | PRN

## 2016-03-21 MED ORDER — HYDROCODONE-ACETAMINOPHEN 5-325 MG PO TABS
1.0000 | ORAL_TABLET | Freq: Four times a day (QID) | ORAL | Status: DC | PRN
  Administered 2016-03-21 – 2016-03-22 (×2): 2 via ORAL
  Filled 2016-03-21 (×2): qty 2

## 2016-03-21 MED ORDER — LIDOCAINE HCL (CARDIAC) 20 MG/ML IV SOLN
INTRAVENOUS | Status: DC | PRN
  Administered 2016-03-21: 80 mg via INTRAVENOUS

## 2016-03-21 MED ORDER — EPHEDRINE SULFATE 50 MG/ML IJ SOLN
INTRAMUSCULAR | Status: DC | PRN
  Administered 2016-03-21 (×4): 10 mg via INTRAVENOUS

## 2016-03-21 MED ORDER — PROPOFOL 10 MG/ML IV BOLUS
INTRAVENOUS | Status: AC
  Filled 2016-03-21: qty 20

## 2016-03-21 MED ORDER — ENOXAPARIN SODIUM 30 MG/0.3ML ~~LOC~~ SOLN
30.0000 mg | SUBCUTANEOUS | Status: DC
  Administered 2016-03-22 – 2016-03-23 (×2): 30 mg via SUBCUTANEOUS
  Filled 2016-03-21 (×2): qty 0.3

## 2016-03-21 MED ORDER — ACETAMINOPHEN 650 MG RE SUPP: 650.0000 mg | Freq: Four times a day (QID) | RECTAL | Status: DC | PRN

## 2016-03-21 MED ORDER — FENTANYL CITRATE (PF) 250 MCG/5ML IJ SOLN
INTRAMUSCULAR | Status: AC
  Filled 2016-03-21: qty 5

## 2016-03-21 MED ORDER — PHENOL 1.4 % MT LIQD: 1.0000 | OROMUCOSAL | Status: DC | PRN

## 2016-03-21 MED ORDER — DEXTROSE 5 % IV SOLN: 500.0000 mg | Freq: Four times a day (QID) | INTRAVENOUS | Status: DC | PRN

## 2016-03-21 MED ORDER — CLINDAMYCIN PHOSPHATE 900 MG/50ML IV SOLN
900.0000 mg | INTRAVENOUS | Status: AC
  Administered 2016-03-21: 900 mg via INTRAVENOUS
  Filled 2016-03-21 (×2): qty 50

## 2016-03-21 MED ORDER — GABAPENTIN 400 MG PO CAPS
400.0000 mg | ORAL_CAPSULE | Freq: Every day | ORAL | Status: DC
  Administered 2016-03-21 – 2016-03-22 (×3): 400 mg via ORAL
  Filled 2016-03-21 (×3): qty 1

## 2016-03-21 MED ORDER — HYDROMORPHONE HCL 1 MG/ML IJ SOLN
0.2500 mg | INTRAMUSCULAR | Status: DC | PRN
  Administered 2016-03-21 (×2): 0.5 mg via INTRAVENOUS

## 2016-03-21 MED ORDER — CLINDAMYCIN PHOSPHATE 900 MG/50ML IV SOLN
INTRAVENOUS | Status: DC | PRN
Start: 2016-03-21 — End: 2016-03-21
  Administered 2016-03-21: 900 mg via INTRAVENOUS

## 2016-03-21 MED ORDER — FOLIC ACID 1 MG PO TABS
1.0000 mg | ORAL_TABLET | Freq: Every day | ORAL | Status: DC
  Administered 2016-03-22 – 2016-03-23 (×2): 1 mg via ORAL
  Filled 2016-03-21 (×2): qty 1

## 2016-03-21 MED ORDER — ONDANSETRON HCL 4 MG/2ML IJ SOLN: 4.0000 mg | Freq: Once | INTRAMUSCULAR | Status: DC | PRN

## 2016-03-21 MED ORDER — ONDANSETRON HCL 4 MG/2ML IJ SOLN: 4.0000 mg | Freq: Four times a day (QID) | INTRAMUSCULAR | Status: DC | PRN

## 2016-03-21 SURGICAL SUPPLY — 45 items
ALCOHOL ISOPROPYL (RUBBING) (MISCELLANEOUS) ×3 IMPLANT
BIT DRILL 4.8X300 (BIT) ×2 IMPLANT
BIT DRILL 4.8X300MM (BIT) ×1
BNDG COHESIVE 4X5 TAN STRL (GAUZE/BANDAGES/DRESSINGS) IMPLANT
CHLORAPREP W/TINT 26ML (MISCELLANEOUS) ×3 IMPLANT
COVER PERINEAL POST (MISCELLANEOUS) ×3 IMPLANT
COVER SURGICAL LIGHT HANDLE (MISCELLANEOUS) ×3 IMPLANT
DERMABOND ADHESIVE PROPEN (GAUZE/BANDAGES/DRESSINGS) ×2
DERMABOND ADVANCED (GAUZE/BANDAGES/DRESSINGS) ×4
DERMABOND ADVANCED .7 DNX12 (GAUZE/BANDAGES/DRESSINGS) ×2 IMPLANT
DERMABOND ADVANCED .7 DNX6 (GAUZE/BANDAGES/DRESSINGS) ×1 IMPLANT
DRAPE C-ARM 42X72 X-RAY (DRAPES) ×3 IMPLANT
DRAPE C-ARMOR (DRAPES) ×3 IMPLANT
DRAPE IMP U-DRAPE 54X76 (DRAPES) ×6 IMPLANT
DRAPE STERI IOBAN 125X83 (DRAPES) ×3 IMPLANT
DRAPE U-SHAPE 47X51 STRL (DRAPES) ×6 IMPLANT
DRAPE UNIVERSAL PACK (DRAPES) ×3 IMPLANT
DRSG MEPILEX BORDER 4X4 (GAUZE/BANDAGES/DRESSINGS) ×3 IMPLANT
DRSG PAD ABDOMINAL 8X10 ST (GAUZE/BANDAGES/DRESSINGS) IMPLANT
ELECT REM PT RETURN 9FT ADLT (ELECTROSURGICAL) ×3
ELECTRODE REM PT RTRN 9FT ADLT (ELECTROSURGICAL) ×1 IMPLANT
FACESHIELD WRAPAROUND (MASK) ×3 IMPLANT
GLOVE BIO SURGEON STRL SZ8.5 (GLOVE) ×6 IMPLANT
GLOVE BIOGEL PI IND STRL 8.5 (GLOVE) ×1 IMPLANT
GLOVE BIOGEL PI INDICATOR 8.5 (GLOVE) ×2
GOWN STRL REUS W/ TWL LRG LVL3 (GOWN DISPOSABLE) ×2 IMPLANT
GOWN STRL REUS W/TWL 2XL LVL3 (GOWN DISPOSABLE) ×3 IMPLANT
GOWN STRL REUS W/TWL LRG LVL3 (GOWN DISPOSABLE) ×4
KIT ROOM TURNOVER OR (KITS) ×3 IMPLANT
MANIFOLD NEPTUNE II (INSTRUMENTS) ×3 IMPLANT
MARKER SKIN DUAL TIP RULER LAB (MISCELLANEOUS) ×3 IMPLANT
NS IRRIG 1000ML POUR BTL (IV SOLUTION) ×3 IMPLANT
PACK GENERAL/GYN (CUSTOM PROCEDURE TRAY) ×3 IMPLANT
PAD ARMBOARD 7.5X6 YLW CONV (MISCELLANEOUS) ×6 IMPLANT
PADDING CAST ABS 4INX4YD NS (CAST SUPPLIES)
PADDING CAST ABS COTTON 4X4 ST (CAST SUPPLIES) IMPLANT
PIN GUIDE DRILL TIP 2.8X300 (DRILL) ×9 IMPLANT
SCREW CANN 8.0X85 HIP (Screw) ×3 IMPLANT
SCREW CANN THRD 6.5X80 (Wire) ×6 IMPLANT
SUT MNCRL AB 3-0 PS2 27 (SUTURE) ×3 IMPLANT
SUT MON AB 2-0 CT1 27 (SUTURE) ×3 IMPLANT
SUT VIC AB 1 CT1 27 (SUTURE) ×2
SUT VIC AB 1 CT1 27XBRD ANBCTR (SUTURE) ×1 IMPLANT
TOWEL OR 17X24 6PK STRL BLUE (TOWEL DISPOSABLE) ×3 IMPLANT
TOWEL OR 17X26 10 PK STRL BLUE (TOWEL DISPOSABLE) ×3 IMPLANT

## 2016-03-21 NOTE — Anesthesia Procedure Notes (Signed)
Procedure Name: Intubation Date/Time: 03/21/2016 2:31 PM Performed by: Terrill Mohr Pre-anesthesia Checklist: Emergency Drugs available, Patient identified, Suction available and Patient being monitored Patient Re-evaluated:Patient Re-evaluated prior to inductionOxygen Delivery Method: Circle system utilized Preoxygenation: Pre-oxygenation with 100% oxygen Intubation Type: IV induction Ventilation: Mask ventilation without difficulty Laryngoscope Size: Mac and 3 Grade View: Grade I Tube type: Oral Tube size: 7.5 mm Number of attempts: 1 Airway Equipment and Method: Stylet Placement Confirmation: positive ETCO2,  breath sounds checked- equal and bilateral and ETT inserted through vocal cords under direct vision Secured at: 21 (cm at teeth) cm Tube secured with: Tape Dental Injury: Teeth and Oropharynx as per pre-operative assessment
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
## 2016-03-21 NOTE — Brief Op Note (Signed)
03/20/2016 - 03/21/2016  3:34 PM  PATIENT:  Tracey [name]  65 y.o. female  PRE-OPERATIVE DIAGNOSIS:  Right femoral neck fracture  POST-OPERATIVE DIAGNOSIS:  Right femoral neck fracture  PROCEDURE:  Procedure(s): CANNULATED HIP PINNING (Right)  SURGEON:  Surgeon(s) and Role:    * Rod Can, MD - Primary  PHYSICIAN ASSISTANT: none  ASSISTANTS: staff   ANESTHESIA:   general  EBL:  Total I/O In: 1000 [I.V.:1000] Out: -   BLOOD ADMINISTERED:none  DRAINS: none   LOCAL MEDICATIONS USED:  NONE  SPECIMEN:  No Specimen  DISPOSITION OF SPECIMEN:  N/A  COUNTS:  YES  TOURNIQUET:  * No tourniquets in log *  DICTATION: .Other Dictation: Dictation Number [id number]  PLAN OF CARE: Admit to inpatient   PATIENT DISPOSITION:  PACU - hemodynamically stable.   Delay start of Pharmacological VTE agent (>24hrs) due to surgical blood loss or risk of bleeding: no
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
## 2016-03-21 NOTE — Progress Notes (Signed)
D; tried usd bedpan, dysuria. Painful abd, bladder scan done, 874ml shown. F-cath placed per protocol, uop obtained 950cc obtained
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
## 2016-03-21 NOTE — Interval H&P Note (Signed)
History and Physical Interval Note:  03/21/2016 1:20 PM  Rajanae A Martinique  has presented today for surgery, with the diagnosis of Right femoral neck fracture  The various methods of treatment have been discussed with the patient and family. After consideration of risks, benefits and other options for treatment, the patient has consented to  Procedure(s): CANNULATED HIP PINNING (Right) as a surgical intervention .  The patient's history has been reviewed, patient examined, no change in status, stable for surgery.  I have reviewed the patient's chart and labs.  Questions were answered to the patient's satisfaction.     We had a lengthy discussion regarding the injury and treatment options. We reviewed hip pinning vs THA. She wishes to proceed with hip pinning. She understands that if the fracture fails to heal, then she will require additional procedures.   The risks, benefits, and alternatives were discussed with the patient. There are risks associated with the surgery including, but not limited to, problems with anesthesia (death), infection, differences in leg length/angulation/rotation, fracture of bones, loosening or failure of implants, malunion, nonunion, hematoma (blood accumulation) which may require surgical drainage, blood clots, pulmonary embolism, nerve injury (foot drop), and blood vessel injury. The patient understands these risks and elects to proceed.    Cissy Galbreath, Horald Pollen
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
## 2016-03-21 NOTE — Anesthesia Preprocedure Evaluation (Addendum)
Anesthesia Evaluation  Patient identified by MRN, date of birth, ID band Patient awake    Reviewed: Allergy & Precautions, H&P , NPO status , Patient's Chart, lab work & pertinent test results  History of Anesthesia Complications Negative for: history of anesthetic complications  Airway Mallampati: II  TM Distance: >3 FB Neck ROM: full    Dental no notable dental hx.    Pulmonary neg pulmonary ROS, shortness of breath,    Pulmonary exam normal breath sounds clear to auscultation       Cardiovascular hypertension, negative cardio ROS Normal cardiovascular exam Rhythm:regular Rate:Normal     Neuro/Psych PSYCHIATRIC DISORDERS  Neuromuscular disease negative neurological ROS     GI/Hepatic negative GI ROS, Neg liver ROS, GERD  ,  Endo/Other  negative endocrine ROS  Renal/GU negative Renal ROS     Musculoskeletal   Abdominal   Peds  Hematology negative hematology ROS (+)   Anesthesia Other Findings osteoporosis  Reproductive/Obstetrics negative OB ROS                             Anesthesia Physical Anesthesia Plan  ASA: II  Anesthesia Plan: General   Post-op Pain Management:    Induction: Intravenous  Airway Management Planned: Oral ETT  Additional Equipment:   Intra-op Plan:   Post-operative Plan: Extubation in OR  Informed Consent: I have reviewed the patients History and Physical, chart, labs and discussed the procedure including the risks, benefits and alternatives for the proposed anesthesia with the patient or authorized representative who has indicated his/her understanding and acceptance.   Dental Advisory Given  Plan Discussed with: Anesthesiologist, CRNA and Surgeon  Anesthesia Plan Comments:         Anesthesia Quick Evaluation

## 2016-03-21 NOTE — Anesthesia Postprocedure Evaluation (Signed)
Anesthesia Post Note  Patient: Tracey [name]  Procedure(s) Performed: Procedure(s) (LRB): CANNULATED HIP PINNING (Right)  Patient location during evaluation: PACU Anesthesia Type: General Level of consciousness: awake and alert and oriented Pain management: pain level controlled Vital Signs Assessment: post-procedure vital signs reviewed and stable Respiratory status: spontaneous breathing, nonlabored ventilation and respiratory function stable Cardiovascular status: blood pressure returned to baseline and stable Postop Assessment: no signs of nausea or vomiting Anesthetic complications: no    Last Vitals:  Filed Vitals:   03/21/16 1715 03/21/16 1730  BP: 119/63 114/70  Pulse: 70 61  Temp:    Resp: 16 15    Last Pain:  Filed Vitals:   03/21/16 1734  PainSc: 8                  Nalanie Winiecki A.

## 2016-03-21 NOTE — Transfer of Care (Signed)
Immediate Anesthesia Transfer of Care Note  Patient: Tracey [name]  Procedure(s) Performed: Procedure(s): CANNULATED HIP PINNING (Right)  Patient Location: PACU  Anesthesia Type:General  Level of Consciousness: awake, alert , oriented and patient cooperative  Airway & Oxygen Therapy: Patient Spontanous Breathing and Patient connected to nasal cannula oxygen  Post-op Assessment: Report given to RN, Post -op Vital signs reviewed and stable and Patient moving all extremities  Post vital signs: Reviewed and stable  Last Vitals:  Filed Vitals:   03/20/16 2300 03/21/16 0500  BP: 168/88 93/53  Pulse: 81 89  Temp: 36.6 C 36.4 C  Resp: 18 18    Complications: No apparent anesthesia complications

## 2016-03-22 DIAGNOSIS — D62 Acute posthemorrhagic anemia: Secondary | ICD-10-CM | POA: Diagnosis not present

## 2016-03-22 DIAGNOSIS — D696 Thrombocytopenia, unspecified: Secondary | ICD-10-CM

## 2016-03-22 DIAGNOSIS — R269 Unspecified abnormalities of gait and mobility: Secondary | ICD-10-CM | POA: Diagnosis not present

## 2016-03-22 DIAGNOSIS — I10 Essential (primary) hypertension: Secondary | ICD-10-CM | POA: Diagnosis not present

## 2016-03-22 DIAGNOSIS — S72001D Fracture of unspecified part of neck of right femur, subsequent encounter for closed fracture with routine healing: Secondary | ICD-10-CM | POA: Diagnosis not present

## 2016-03-22 LAB — CBC
HEMATOCRIT: 32.6 % — AB (ref 36.0–46.0)
Hemoglobin: 10.5 g/dL — ABNORMAL LOW (ref 12.0–15.0)
MCH: 27.9 pg (ref 26.0–34.0)
MCHC: 32.2 g/dL (ref 30.0–36.0)
MCV: 86.5 fL (ref 78.0–100.0)
Platelets: 107 10*3/uL — ABNORMAL LOW (ref 150–400)
RBC: 3.77 MIL/uL — ABNORMAL LOW (ref 3.87–5.11)
RDW: 13.6 % (ref 11.5–15.5)
WBC: 5 10*3/uL (ref 4.0–10.5)

## 2016-03-22 LAB — BASIC METABOLIC PANEL
ANION GAP: 7 (ref 5–15)
BUN: 6 mg/dL (ref 6–20)
CALCIUM: 7.5 mg/dL — AB (ref 8.9–10.3)
CO2: 23 mmol/L (ref 22–32)
Chloride: 105 mmol/L (ref 101–111)
Creatinine, Ser: 1.01 mg/dL — ABNORMAL HIGH (ref 0.44–1.00)
GFR, EST NON AFRICAN AMERICAN: 57 mL/min — AB (ref >60)
Glucose, Bld: 117 mg/dL — ABNORMAL HIGH (ref 65–99)
POTASSIUM: 3.5 mmol/L (ref 3.5–5.1)
Sodium: 135 mmol/L (ref 135–145)

## 2016-03-22 LAB — VITAMIN D 25 HYDROXY (VIT D DEFICIENCY, FRACTURES): Vit D, 25-Hydroxy: 64 ng/mL (ref 30.0–100.0)

## 2016-03-22 MED ORDER — ENOXAPARIN SODIUM 30 MG/0.3ML ~~LOC~~ SOLN: 30.0000 mg | SUBCUTANEOUS | Status: DC

## 2016-03-22 MED ORDER — HYDROCODONE-ACETAMINOPHEN 5-325 MG PO TABS: 1.0000 | ORAL_TABLET | ORAL | Status: DC | PRN

## 2016-03-22 MED ORDER — HYDROCODONE-ACETAMINOPHEN 5-325 MG PO TABS
1.0000 | ORAL_TABLET | ORAL | Status: DC | PRN
  Administered 2016-03-22 – 2016-03-23 (×4): 2 via ORAL
  Filled 2016-03-22 (×5): qty 2
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
## 2016-03-22 NOTE — Progress Notes (Signed)
PT Cancellation Note  Patient Details Name: Tracey [name] MRN: [id number] DOB: 03-Aug-1951   Cancelled Treatment:    Reason Eval/Treat Not Completed: Medical issues which prohibited therapy (pt remains on bedrest and await increased activity order)   Lanetta Inch Beth 03/22/2016, 7:05 AM Elwyn Reach, [place]
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
## 2016-03-23 DIAGNOSIS — D696 Thrombocytopenia, unspecified: Secondary | ICD-10-CM | POA: Diagnosis present

## 2016-03-23 DIAGNOSIS — S72001D Fracture of unspecified part of neck of right femur, subsequent encounter for closed fracture with routine healing: Secondary | ICD-10-CM | POA: Diagnosis not present

## 2016-03-23 DIAGNOSIS — R636 Underweight: Secondary | ICD-10-CM

## 2016-03-23 DIAGNOSIS — D62 Acute posthemorrhagic anemia: Secondary | ICD-10-CM | POA: Diagnosis not present

## 2016-03-23 DIAGNOSIS — I10 Essential (primary) hypertension: Secondary | ICD-10-CM

## 2016-03-23 LAB — CBC
HEMATOCRIT: 29.9 % — AB (ref 36.0–46.0)
Hemoglobin: 9.6 g/dL — ABNORMAL LOW (ref 12.0–15.0)
MCH: 27.6 pg (ref 26.0–34.0)
MCHC: 32.1 g/dL (ref 30.0–36.0)
MCV: 85.9 fL (ref 78.0–100.0)
Platelets: 118 10*3/uL — ABNORMAL LOW (ref 150–400)
RBC: 3.48 MIL/uL — AB (ref 3.87–5.11)
RDW: 13.9 % (ref 11.5–15.5)
WBC: 4.3 10*3/uL (ref 4.0–10.5)

## 2016-03-23 MED ORDER — POLYETHYLENE GLYCOL 3350 17 G PO PACK: 17.0000 g | PACK | Freq: Every day | ORAL | Status: DC | PRN

## 2016-03-23 MED ORDER — BOOST / RESOURCE BREEZE PO LIQD: 1.0000 | Freq: Two times a day (BID) | ORAL | Status: DC

## 2016-03-23 MED ORDER — METHOCARBAMOL 500 MG PO TABS: 500.0000 mg | ORAL_TABLET | Freq: Four times a day (QID) | ORAL | Status: DC | PRN
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
## 2016-03-23 NOTE — Progress Notes (Signed)
Reviewed discharge papers and medications with full understanding Audie Box, RN

## 2016-03-23 NOTE — Progress Notes (Signed)
Pt being discharged reviewed how to give lovenox and reviewed discharge papers
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
## 2016-03-23 NOTE — Discharge Instructions (Signed)
Dr. Rod Can Adult Hip & Knee Specialist [facility] [address], [place], [place] [address] [phone]   POSTOPERATIVE DIRECTIONS    Hip Rehabilitation, Guidelines Following Surgery   WEIGHT BEARING Partial weight bearing with assist device as directed.  touch down weight bearing right leg   HOME CARE INSTRUCTIONS  Remove items at home which could result in a fall. This includes throw rugs or furniture in walking pathways.  Continue medications as instructed at time of discharge.  You may have some home medications which will be placed on hold until you complete the course of blood thinner medication.  4 days after discharge, you may start showering. No tub baths or soaking your incisions. Do not put on socks or shoes without following the instructions of your caregivers.   Sit on chairs with arms. Use the chair arms to help push yourself up when arising.  Arrange for the use of a toilet seat elevator so you are not sitting low.   Walk with walker as instructed - touch down weight bearing.  You may resume a sexual relationship in one month or when given the OK by your caregiver.  Use walker as long as suggested by your caregivers.  Avoid periods of inactivity such as sitting longer than an hour when not asleep. This helps prevent blood clots.  You may return to work once you are cleared by [employer], production.  Do not drive a car for 6 weeks or until released by your surgeon.  Do not drive while taking narcotics.  Wear elastic stockings for two weeks following surgery during the day but you may remove then at night.  Make sure you keep all of your appointments after your operation with all of your doctors and caregivers. You should call the office at the above phone number and make an appointment for approximately two weeks after the date of your surgery. Please pick up a stool softener and laxative for home use as long as you are requiring pain  medications.  ICE to the affected hip every three hours for 30 minutes at a time and then as needed for pain and swelling. Continue to use ice on the hip for pain and swelling from surgery. You may notice swelling that will progress down to the foot and ankle.  This is normal after surgery.  Elevate the leg when you are not up walking on it.   It is important for you to complete the blood thinner medication as prescribed by your doctor.  Continue to use the breathing machine which will help keep your temperature down.  It is common for your temperature to cycle up and down following surgery, especially at night when you are not up moving around and exerting yourself.  The breathing machine keeps your lungs expanded and your temperature down.  RANGE OF MOTION AND STRENGTHENING EXERCISES  These exercises are designed to help you keep full movement of your hip joint. Follow your caregiver's or physical therapist's instructions. Perform all exercises about fifteen times, three times per day or as directed. Exercise both hips, even if you have had only one joint replacement. These exercises can be done on a training (exercise) mat, on the floor, on a table or on a bed. Use whatever works the best and is most comfortable for you. Use music or television while you are exercising so that the exercises are a pleasant break in your day. This will make your life better with the exercises acting  as a break in routine you can look forward to.  Lying on your back, slowly slide your foot toward your buttocks, raising your knee up off the floor. Then slowly slide your foot back down until your leg is straight again.  Lying on your back spread your legs as far apart as you can without causing discomfort.  Lying on your side, raise your upper leg and foot straight up from the floor as far as is comfortable. Slowly lower the leg and repeat.  Lying on your back, tighten up the muscle in the front of your thigh (quadriceps  muscles). You can do this by keeping your leg straight and trying to raise your heel off the floor. This helps strengthen the largest muscle supporting your knee.  Lying on your back, tighten up the muscles of your buttocks both with the legs straight and with the knee bent at a comfortable angle while keeping your heel on the floor.   SKILLED REHAB INSTRUCTIONS: If the patient is transferred to a skilled rehab facility following release from the hospital, a list of the current medications will be sent to the facility for the patient to continue.  When discharged from the skilled rehab facility, please have the facility set up the patient's [place] prior to being released. Also, the skilled facility will be responsible for providing the patient with their medications at time of release from the facility to include their pain medication and their blood thinner medication. If the patient is still at the rehab facility at time of the two week follow up appointment, the skilled rehab facility will also need to assist the patient in arranging follow up appointment in our office and any transportation needs.  MAKE SURE YOU:  Understand these instructions.  Will watch your condition.  Will get help right away if you are not doing well or get worse.  Pick up stool softner and laxative for home use following surgery while on pain medications. Daily dry dressing changes as needed. In 4 days, you may remove your dressings and begin taking showers - no tub baths or soaking the incisions. Continue to use ice for pain and swelling after surgery. Do not use any lotions or creams on the incision until instructed by your surgeon.
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
## 2016-03-23 NOTE — Progress Notes (Signed)
Orthopedics Progress Note  Subjective: Patient feeling better and ready for D/C  Objective:  Filed Vitals:   03/22/16 2043 03/23/16 0407  BP: 102/59 94/55  Pulse: 77   Temp: 98.2 F (36.8 C) 98.4 F (36.9 C)  Resp: 18 18    General: Awake and alert  Musculoskeletal: right hip incision looks good, mepilex dressing in place, no erythema, no significant swelling or bruising. No pain with active ankle pumps Neurovascularly intact  Lab Results  Component Value Date   WBC 4.3 03/23/2016   HGB 9.6* 03/23/2016   HCT 29.9* 03/23/2016   MCV 85.9 03/23/2016   PLT 118* 03/23/2016       Component Value Date/Time   NA 135 03/22/2016 0716   K 3.5 03/22/2016 0716   CL 105 03/22/2016 0716   CO2 23 03/22/2016 0716   GLUCOSE 117* 03/22/2016 0716   BUN 6 03/22/2016 0716   CREATININE 1.01* 03/22/2016 0716   CREATININE 1.00 05/19/2013 1631   CALCIUM 7.5* 03/22/2016 0716   GFRNONAA 57* 03/22/2016 0716   GFRAA >60 03/22/2016 0716    Lab Results  Component Value Date   INR 1.07 03/20/2016    Assessment/Plan: POD #2 s/p Procedure(s): CANNULATED HIP PINNING Patient stable for discharge to home TTWB R LE   Remo Lipps R. Veverly Fells, MD 03/23/2016 10:09 AM

## 2016-03-24 ENCOUNTER — Encounter (HOSPITAL_COMMUNITY): Payer: Self-pay | Admitting: Orthopedic Surgery

## 2016-03-24 MED FILL — ENOXAPARIN 30 MG/0.3 ML SYR: 30 | 30 days supply | Qty: 9 | Fill #0

## 2016-03-24 MED FILL — POLYETHYLENE GLYCOL 3350: 15 days supply | Qty: 255 | Fill #0

## 2016-03-24 MED FILL — METHOCARBAMOL 500 MG TABLET: 500 | 5 days supply | Qty: 20 | Fill #0

## 2016-03-24 MED FILL — HYDROCODON-APAP 5-325: 5-325 | 7 days supply | Qty: 80 | Fill #0

## 2016-03-25 DIAGNOSIS — I10 Essential (primary) hypertension: Secondary | ICD-10-CM | POA: Diagnosis not present

## 2016-03-25 DIAGNOSIS — Z85828 Personal history of other malignant neoplasm of skin: Secondary | ICD-10-CM | POA: Diagnosis not present

## 2016-03-25 DIAGNOSIS — K589 Irritable bowel syndrome without diarrhea: Secondary | ICD-10-CM | POA: Diagnosis not present

## 2016-03-25 DIAGNOSIS — M1990 Unspecified osteoarthritis, unspecified site: Secondary | ICD-10-CM | POA: Diagnosis not present

## 2016-03-25 DIAGNOSIS — S72001D Fracture of unspecified part of neck of right femur, subsequent encounter for closed fracture with routine healing: Secondary | ICD-10-CM | POA: Diagnosis not present

## 2016-03-25 DIAGNOSIS — Z7982 Long term (current) use of aspirin: Secondary | ICD-10-CM | POA: Diagnosis not present

## 2016-03-27 DIAGNOSIS — I10 Essential (primary) hypertension: Secondary | ICD-10-CM | POA: Diagnosis not present

## 2016-03-27 DIAGNOSIS — S72001D Fracture of unspecified part of neck of right femur, subsequent encounter for closed fracture with routine healing: Secondary | ICD-10-CM | POA: Diagnosis not present

## 2016-03-27 DIAGNOSIS — Z85828 Personal history of other malignant neoplasm of skin: Secondary | ICD-10-CM | POA: Diagnosis not present

## 2016-03-27 DIAGNOSIS — K589 Irritable bowel syndrome without diarrhea: Secondary | ICD-10-CM | POA: Diagnosis not present

## 2016-03-27 DIAGNOSIS — M1990 Unspecified osteoarthritis, unspecified site: Secondary | ICD-10-CM | POA: Diagnosis not present

## 2016-03-27 DIAGNOSIS — Z7982 Long term (current) use of aspirin: Secondary | ICD-10-CM | POA: Diagnosis not present

## 2016-03-31 DIAGNOSIS — M1990 Unspecified osteoarthritis, unspecified site: Secondary | ICD-10-CM | POA: Diagnosis not present

## 2016-03-31 DIAGNOSIS — K589 Irritable bowel syndrome without diarrhea: Secondary | ICD-10-CM | POA: Diagnosis not present

## 2016-03-31 DIAGNOSIS — Z85828 Personal history of other malignant neoplasm of skin: Secondary | ICD-10-CM | POA: Diagnosis not present

## 2016-03-31 DIAGNOSIS — S72001D Fracture of unspecified part of neck of right femur, subsequent encounter for closed fracture with routine healing: Secondary | ICD-10-CM | POA: Diagnosis not present

## 2016-03-31 DIAGNOSIS — I10 Essential (primary) hypertension: Secondary | ICD-10-CM | POA: Diagnosis not present

## 2016-03-31 DIAGNOSIS — Z7982 Long term (current) use of aspirin: Secondary | ICD-10-CM | POA: Diagnosis not present

## 2016-04-03 ENCOUNTER — Ambulatory Visit: Payer: PPO | Admitting: Family Medicine

## 2016-04-03 DIAGNOSIS — Z85828 Personal history of other malignant neoplasm of skin: Secondary | ICD-10-CM | POA: Diagnosis not present

## 2016-04-03 DIAGNOSIS — I10 Essential (primary) hypertension: Secondary | ICD-10-CM | POA: Diagnosis not present

## 2016-04-03 DIAGNOSIS — M1990 Unspecified osteoarthritis, unspecified site: Secondary | ICD-10-CM | POA: Diagnosis not present

## 2016-04-03 DIAGNOSIS — Z7982 Long term (current) use of aspirin: Secondary | ICD-10-CM | POA: Diagnosis not present

## 2016-04-03 DIAGNOSIS — S72001D Fracture of unspecified part of neck of right femur, subsequent encounter for closed fracture with routine healing: Secondary | ICD-10-CM | POA: Diagnosis not present

## 2016-04-03 DIAGNOSIS — K589 Irritable bowel syndrome without diarrhea: Secondary | ICD-10-CM | POA: Diagnosis not present

## 2016-04-04 DIAGNOSIS — S72041D Displaced fracture of base of neck of right femur, subsequent encounter for closed fracture with routine healing: Secondary | ICD-10-CM | POA: Diagnosis not present

## 2016-04-07 DIAGNOSIS — K589 Irritable bowel syndrome without diarrhea: Secondary | ICD-10-CM | POA: Diagnosis not present

## 2016-04-07 DIAGNOSIS — Z85828 Personal history of other malignant neoplasm of skin: Secondary | ICD-10-CM | POA: Diagnosis not present

## 2016-04-07 DIAGNOSIS — S72001D Fracture of unspecified part of neck of right femur, subsequent encounter for closed fracture with routine healing: Secondary | ICD-10-CM | POA: Diagnosis not present

## 2016-04-07 DIAGNOSIS — I10 Essential (primary) hypertension: Secondary | ICD-10-CM | POA: Diagnosis not present

## 2016-04-07 DIAGNOSIS — M1990 Unspecified osteoarthritis, unspecified site: Secondary | ICD-10-CM | POA: Diagnosis not present

## 2016-04-07 DIAGNOSIS — Z7982 Long term (current) use of aspirin: Secondary | ICD-10-CM | POA: Diagnosis not present

## 2016-04-08 MED FILL — GABAPENTIN 400 MG CAPSULE: 400 | 90 days supply | Qty: 270 | Fill #0

## 2016-04-08 MED FILL — CYCLOBENZAPRINE 10 MG TAB: 10 | 90 days supply | Qty: 45 | Fill #0

## 2016-04-08 MED FILL — AMLODIPINE BESYLATE 5 MG TA: 5 | 90 days supply | Qty: 90 | Fill #0

## 2016-04-16 DIAGNOSIS — L817 Pigmented purpuric dermatosis: Secondary | ICD-10-CM | POA: Diagnosis not present

## 2016-04-16 DIAGNOSIS — D2271 Melanocytic nevi of right lower limb, including hip: Secondary | ICD-10-CM | POA: Diagnosis not present

## 2016-04-16 DIAGNOSIS — Z85820 Personal history of malignant melanoma of skin: Secondary | ICD-10-CM | POA: Diagnosis not present

## 2016-04-16 DIAGNOSIS — D2262 Melanocytic nevi of left upper limb, including shoulder: Secondary | ICD-10-CM | POA: Diagnosis not present

## 2016-04-16 DIAGNOSIS — D225 Melanocytic nevi of trunk: Secondary | ICD-10-CM | POA: Diagnosis not present

## 2016-04-16 DIAGNOSIS — Z85828 Personal history of other malignant neoplasm of skin: Secondary | ICD-10-CM | POA: Diagnosis not present

## 2016-04-16 DIAGNOSIS — L570 Actinic keratosis: Secondary | ICD-10-CM | POA: Diagnosis not present

## 2016-04-16 DIAGNOSIS — L821 Other seborrheic keratosis: Secondary | ICD-10-CM | POA: Diagnosis not present

## 2016-04-16 DIAGNOSIS — D2261 Melanocytic nevi of right upper limb, including shoulder: Secondary | ICD-10-CM | POA: Diagnosis not present

## 2016-04-17 MED FILL — ESTRACE 0.01% CREAM: 0.1 | 90 days supply | Qty: 43 | Fill #0

## 2016-05-07 DIAGNOSIS — Z4789 Encounter for other orthopedic aftercare: Secondary | ICD-10-CM | POA: Diagnosis not present

## 2016-05-07 DIAGNOSIS — S72041D Displaced fracture of base of neck of right femur, subsequent encounter for closed fracture with routine healing: Secondary | ICD-10-CM | POA: Diagnosis not present

## 2016-05-12 DIAGNOSIS — M810 Age-related osteoporosis without current pathological fracture: Secondary | ICD-10-CM | POA: Diagnosis not present

## 2016-05-12 DIAGNOSIS — I10 Essential (primary) hypertension: Secondary | ICD-10-CM | POA: Diagnosis not present

## 2016-06-09 DIAGNOSIS — Z23 Encounter for immunization: Secondary | ICD-10-CM | POA: Diagnosis not present

## 2016-06-09 DIAGNOSIS — I10 Essential (primary) hypertension: Secondary | ICD-10-CM | POA: Diagnosis not present

## 2016-06-09 DIAGNOSIS — Z0000 Encounter for general adult medical examination without abnormal findings: Secondary | ICD-10-CM | POA: Diagnosis not present

## 2016-06-09 DIAGNOSIS — R197 Diarrhea, unspecified: Secondary | ICD-10-CM | POA: Diagnosis not present

## 2016-06-09 DIAGNOSIS — Z1389 Encounter for screening for other disorder: Secondary | ICD-10-CM | POA: Diagnosis not present

## 2016-06-09 MED FILL — FOLIC ACID 1 MG TABLET: 1 | 90 days supply | Qty: 90 | Fill #0

## 2016-06-26 DIAGNOSIS — S72041D Displaced fracture of base of neck of right femur, subsequent encounter for closed fracture with routine healing: Secondary | ICD-10-CM | POA: Diagnosis not present

## 2016-07-15 DIAGNOSIS — R195 Other fecal abnormalities: Secondary | ICD-10-CM | POA: Diagnosis not present

## 2016-07-15 DIAGNOSIS — R197 Diarrhea, unspecified: Secondary | ICD-10-CM | POA: Diagnosis not present

## 2016-07-15 DIAGNOSIS — Z8781 Personal history of (healed) traumatic fracture: Secondary | ICD-10-CM | POA: Diagnosis not present

## 2016-07-15 DIAGNOSIS — M810 Age-related osteoporosis without current pathological fracture: Secondary | ICD-10-CM | POA: Diagnosis not present

## 2016-07-15 DIAGNOSIS — R636 Underweight: Secondary | ICD-10-CM | POA: Diagnosis not present

## 2016-07-18 MED FILL — AMLODIPINE BESYLATE 5 MG TA: 5 | 90 days supply | Qty: 90 | Fill #0

## 2016-08-07 DIAGNOSIS — R197 Diarrhea, unspecified: Secondary | ICD-10-CM | POA: Diagnosis not present

## 2016-08-18 ENCOUNTER — Other Ambulatory Visit: Payer: Self-pay | Admitting: Obstetrics and Gynecology

## 2016-08-18 DIAGNOSIS — Z1231 Encounter for screening mammogram for malignant neoplasm of breast: Secondary | ICD-10-CM

## 2016-08-20 MED FILL — ESTRACE 0.01% CREAM: 0.1 | 90 days supply | Qty: 43 | Fill #1

## 2016-08-26 ENCOUNTER — Ambulatory Visit
Admission: RE | Admit: 2016-08-26 | Discharge: 2016-08-26 | Disposition: A | Discharge status: Home / Self Care | Payer: PPO | Source: Ambulatory Visit | Attending: Obstetrics and Gynecology | Admitting: Obstetrics and Gynecology

## 2016-08-26 DIAGNOSIS — Z1231 Encounter for screening mammogram for malignant neoplasm of breast: Secondary | ICD-10-CM

## 2016-09-03 MED FILL — CYCLOBENZAPRINE 10 MG TAB: 10 | 90 days supply | Qty: 45 | Fill #1

## 2016-09-03 MED FILL — FOLIC ACID 1 MG TABLET: 1 | 90 days supply | Qty: 90 | Fill #1

## 2016-09-04 ENCOUNTER — Other Ambulatory Visit: Payer: Self-pay | Admitting: Internal Medicine

## 2016-09-04 ENCOUNTER — Ambulatory Visit
Admission: RE | Admit: 2016-09-04 | Discharge: 2016-09-04 | Disposition: A | Discharge status: Home / Self Care | Payer: PPO | Source: Ambulatory Visit | Attending: Internal Medicine | Admitting: Internal Medicine

## 2016-09-04 DIAGNOSIS — R5383 Other fatigue: Secondary | ICD-10-CM | POA: Diagnosis not present

## 2016-09-04 DIAGNOSIS — R6884 Jaw pain: Secondary | ICD-10-CM | POA: Diagnosis not present

## 2016-10-14 DIAGNOSIS — C44712 Basal cell carcinoma of skin of right lower limb, including hip: Secondary | ICD-10-CM | POA: Diagnosis not present

## 2016-10-14 DIAGNOSIS — D1801 Hemangioma of skin and subcutaneous tissue: Secondary | ICD-10-CM | POA: Diagnosis not present

## 2016-10-14 DIAGNOSIS — L821 Other seborrheic keratosis: Secondary | ICD-10-CM | POA: Diagnosis not present

## 2016-10-14 DIAGNOSIS — C44519 Basal cell carcinoma of skin of other part of trunk: Secondary | ICD-10-CM | POA: Diagnosis not present

## 2016-10-14 DIAGNOSIS — D2271 Melanocytic nevi of right lower limb, including hip: Secondary | ICD-10-CM | POA: Diagnosis not present

## 2016-10-14 DIAGNOSIS — Z85828 Personal history of other malignant neoplasm of skin: Secondary | ICD-10-CM | POA: Diagnosis not present

## 2016-10-14 DIAGNOSIS — D2262 Melanocytic nevi of left upper limb, including shoulder: Secondary | ICD-10-CM | POA: Diagnosis not present

## 2016-10-14 DIAGNOSIS — D225 Melanocytic nevi of trunk: Secondary | ICD-10-CM | POA: Diagnosis not present

## 2016-10-14 DIAGNOSIS — Z85820 Personal history of malignant melanoma of skin: Secondary | ICD-10-CM | POA: Diagnosis not present

## 2016-10-17 MED FILL — AMLODIPINE BESYLATE 5 MG TA: 5 | 90 days supply | Qty: 90 | Fill #1

## 2016-10-23 ENCOUNTER — Encounter (HOSPITAL_BASED_OUTPATIENT_CLINIC_OR_DEPARTMENT_OTHER): Payer: Self-pay

## 2016-10-23 ENCOUNTER — Emergency Department (HOSPITAL_BASED_OUTPATIENT_CLINIC_OR_DEPARTMENT_OTHER)
Admission: EM | Admit: 2016-10-23 | Discharge: 2016-10-23 | Disposition: A | Discharge status: Home / Self Care | Payer: PPO | Attending: Emergency Medicine | Admitting: Emergency Medicine

## 2016-10-23 DIAGNOSIS — W268XXA Contact with other sharp object(s), not elsewhere classified, initial encounter: Secondary | ICD-10-CM | POA: Insufficient documentation

## 2016-10-23 DIAGNOSIS — I10 Essential (primary) hypertension: Secondary | ICD-10-CM | POA: Insufficient documentation

## 2016-10-23 DIAGNOSIS — Z7982 Long term (current) use of aspirin: Secondary | ICD-10-CM | POA: Diagnosis not present

## 2016-10-23 DIAGNOSIS — Y929 Unspecified place or not applicable: Secondary | ICD-10-CM | POA: Diagnosis not present

## 2016-10-23 DIAGNOSIS — Z79899 Other long term (current) drug therapy: Secondary | ICD-10-CM | POA: Diagnosis not present

## 2016-10-23 DIAGNOSIS — Y939 Activity, unspecified: Secondary | ICD-10-CM | POA: Insufficient documentation

## 2016-10-23 DIAGNOSIS — Y999 Unspecified external cause status: Secondary | ICD-10-CM | POA: Diagnosis not present

## 2016-10-23 DIAGNOSIS — S6992XA Unspecified injury of left wrist, hand and finger(s), initial encounter: Secondary | ICD-10-CM | POA: Diagnosis not present

## 2016-10-23 DIAGNOSIS — Z85820 Personal history of malignant melanoma of skin: Secondary | ICD-10-CM | POA: Diagnosis not present

## 2016-10-23 DIAGNOSIS — S61213A Laceration without foreign body of left middle finger without damage to nail, initial encounter: Secondary | ICD-10-CM | POA: Insufficient documentation

## 2016-10-23 MED ORDER — LIDOCAINE HCL (PF) 1 % IJ SOLN
30.0000 mL | Freq: Once | INTRAMUSCULAR | Status: AC
  Administered 2016-10-23: 30 mL

## 2016-10-23 NOTE — ED Provider Notes (Signed)
[facility] DEPT [facility] Provider Note   CSN: [id number] Arrival date & time: 10/23/16  2015  By signing my name below, I, Sonum Patel, attest that this documentation has been prepared under the direction and in the presence of [employer]. Electronically Signed: Sonum Patel, Education administrator. 10/23/16. 8:42 PM.  History   Chief Complaint Chief Complaint  Patient presents with  . Laceration    The history is provided by the patient. No language interpreter was used.     HPI Comments: Tracey [name] is a 65 y.o. female who presents to the Emergency Department complaining of a laceration to the left middle finger that occurred PTA. She states the injury occurred due to a broken vase. She has associated mild pain to the affected area. She rinsed the area with water and has applied pressure to control the bleeding. She takes a baby ASA daily. Her last tetanus is UTD.   Past Medical History:  Diagnosis Date  . Closed hip fracture ([place]) 03/2016   RT HIP  . Hypertension   . Osteopenia   . Osteoporosis    h/o  . Skin cancer    multiple skin cancers    Patient Active Problem List   Diagnosis Date Noted  . Underweight 03/23/2016  . Postoperative anemia due to acute blood loss 03/23/2016  . Thrombocytopenia ([place]) 03/23/2016  . Essential hypertension 03/21/2016  . Osteopenia 03/21/2016  . Displaced fracture of right femoral neck ([place]) 03/21/2016  . Thrush 02/14/2016  . Piriformis syndrome of right side 11/14/2015  . Contusion, hip 08/30/2015  . Nonallopathic lesion of lumbosacral region 09/25/2014  . Sprain of toe 05/09/2014  . Sacroiliac dysfunction 12/13/2013  . Nonallopathic lesion of sacral region 12/13/2013  . SOB (shortness of breath) 06/14/2013  . 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 Neck muscle spasm 03/23/2012  . Nonallopathic lesion of cervical region 04/03/2011  . Nonallopathic lesion of thoracic region 04/03/2011  . Irritable bowel syndrome 02/11/2011    . Generalized anxiety disorder 02/11/2011  . GERD (gastroesophageal reflux disease) 02/07/2011  . Chronic sinusitis 02/20/2010  . Osteoporosis 07/05/2008    Past Surgical History:  Procedure Laterality Date  . CALDWELL LUC    . COLONOSCOPY WITH PROPOFOL N/A 03/22/2014   Procedure: COLONOSCOPY WITH PROPOFOL;  Surgeon: Arta Silence, MD;  Location: [facility] END[facility];  Service: Endoscopy;  Laterality: N/A;  . HIP PINNING,CANNULATED Right 03/21/2016   Procedure: CANNULATED HIP PINNING;  Surgeon: Rod Can, MD;  Location: [facility];  Service: Orthopedics;  Laterality: Right;  . NASAL SEPTUM SURGERY     past hx. deviated septum  . TONSILLECTOMY      OB History    Gravida Para Term Preterm AB Living   1 1       1    SAB TAB Ectopic Multiple Live Births                   Home Medications    Prior to Admission medications   Medication Sig Start Date End Date Taking? Authorizing Provider  amLODipine (NORVASC) 5 MG tablet Take 5 mg by mouth every evening.    Historical Provider, MD  Ascorbic Acid (VITAMIN C PO) Take 1 tablet by mouth daily.    Historical Provider, MD  aspirin 81 MG tablet Take 81 mg by mouth daily.    Historical Provider, MD  CALCIUM PO Take 1 tablet by mouth daily.    Historical Provider, MD  Cholecalciferol (VITAMIN D PO) Take 1 tablet by mouth  daily.    Historical Provider, MD  cyclobenzaprine (FLEXERIL) 10 MG tablet Take 5 mg by mouth at bedtime.     Historical Provider, MD  enoxaparin (LOVENOX) 30 MG/0.3ML injection Inject 0.3 mLs (30 mg total) into the skin daily. 03/22/16   Rod Can, MD  ferrous sulfate 325 (65 FE) MG tablet Take 325 mg by mouth daily with breakfast.    Historical Provider, MD  fish oil-omega-3 fatty acids 1000 MG capsule Take 1 g by mouth 2 (two) times daily.     Historical Provider, MD  folic acid (FOLVITE) 1 MG tablet Take 1 mg by mouth daily.      Historical Provider, MD  gabapentin (NEURONTIN) 400 MG capsule Take 400 mg by mouth at bedtime.     Historical Provider, MD  HYDROcodone-acetaminophen (NORCO/VICODIN) 5-325 MG tablet Take 1-2 tablets by mouth every 4 (four) hours as needed for moderate pain. 03/22/16   Rod Can, MD  methocarbamol (ROBAXIN) 500 MG tablet Take 1 tablet (500 mg total) by mouth every 6 (six) hours as needed for muscle spasms. 03/23/16   Nishant Dhungel, MD  Multiple Vitamin (MULTIVITAMIN WITH MINERALS) TABS tablet Take 1 tablet by mouth daily.    Historical Provider, MD  polyethylene glycol (MIRALAX / GLYCOLAX) packet Take 17 g by mouth daily as needed for mild constipation. 03/23/16   Nishant Dhungel, MD    Family History Family History  Problem Relation Age of Onset  . Hypertension Mother   . Leukemia Father   . Hypertension Father   . Diabetes Brother   . Hypertension Brother   . Hyperlipidemia Maternal Grandmother   . Cancer Maternal Grandfather   . Heart disease Paternal Grandfather   . Stroke Paternal Grandfather     Social History Social History  Substance Use Topics  . Smoking status: Never Smoker  . Smokeless tobacco: Never Used  . Alcohol use 0.0 oz/week     Allergies   Cefaclor and Neomycin-bacitracin zn-polymyx   Review of Systems Review of Systems  Constitutional: Negative for activity change.  Musculoskeletal: Positive for arthralgias. Negative for back pain, joint swelling and neck pain.  Skin: Positive for wound.  Neurological: Negative for weakness and numbness.     Physical Exam Updated Vital Signs BP 173/91 (BP Location: Right Arm)   Pulse 88   Temp 97.6 F (36.4 C) (Oral)   Resp 18   Ht 5\' 6"  (1.676 m)   Wt 103 lb (46.7 kg)   SpO2 96%   BMI 16.62 kg/m   Physical Exam  Constitutional: She is oriented to person, place, and time. She appears well-developed and well-nourished.  HENT:  Head: Normocephalic and atraumatic.  Cardiovascular: Normal rate.   Pulmonary/Chest: Effort normal.  Neurological: She is alert and oriented to person, place, and time.   Skin: Skin is warm and dry.  1.5cm, clean, oozing, c-shaped flap laceration on the L long finger just distal to the PIP on the ulnar aspect. No tendon involvement visualized when base of wound explored. 5/5 strength flexion and extension at all joints of finger. < 2s cap refill.   Psychiatric: She has a normal mood and affect.  Nursing note and vitals reviewed.    ED Treatments / Results  DIAGNOSTIC STUDIES: Oxygen Saturation is 96% on RA, adequate by my interpretation.    COORDINATION OF CARE: 8:42 PM Will suture the laceration. Discussed treatment plan with pt at bedside and pt agreed to plan.  Procedures Procedures (including critical care time) LACERATION REPAIR  PROCEDURE NOTE The patient's identification was confirmed and consent was obtained. This procedure was performed by Alecia Lemming PA-C  at 8:45 PM. Site: L long finger Sterile procedures observed Anesthetic used (type and amt): 1% lido without epi Suture type/size: 6-0 Ethilon Length: 1.5cm # of Sutures: 4 Technique: simple interrupted Complexity superficial Tetanus UTD Site anesthetized, cleaned with dermal cleanser, explored without evidence of foreign body, wound well approximated, site covered with dry, sterile dressing.  Patient tolerated procedure well without complications. Instructions for care discussed verbally and patient provided with additional written instructions for homecare and f/u.   Medications Ordered in ED Medications - No data to display   Initial Impression / Assessment and Plan / ED Course  I have reviewed the triage vital signs and the nursing notes.  Pertinent labs & imaging results that were available during my care of the patient were reviewed by me and considered in my medical decision making (see chart for details).  Clinical Course    Vital signs reviewed and are as follows: Vitals:   10/23/16 2020 10/23/16 2205  BP: 173/91 140/88  Pulse: 88 81  Resp: 18 16  Temp: 97.6 F  (36.4 C)     10:08 PM Patient counseled on wound care. Patient counseled on need to return or see PCP/urgent care for suture removal in 10 days. Patient was urged to return to the Emergency Department urgently with worsening pain, swelling, expanding erythema especially if it streaks away from the affected area, fever, or if they have any other concerns. Patient verbalized understanding.    Final Clinical Impressions(s) / ED Diagnoses   Final diagnoses:  Laceration of left middle finger without foreign body without damage to nail, initial encounter   Patient with finger lac, repaired as above. No complication suspected. No immunocompromise. Normal function of digit. Tetanus UTD.   New Prescriptions New Prescriptions   No medications on file    I personally performed the services described in this documentation, which was scribed in my presence. The recorded information has been reviewed and is accurate.    Carlisle Cater, PA-C 10/23/16 [place], MD 10/26/16 [phone]

## 2016-10-23 NOTE — Discharge Instructions (Signed)
Please read and follow all provided instructions.  Your diagnoses today include:  1. Laceration of left middle finger without foreign body without damage to nail, initial encounter     Tests performed today include:  Vital signs. See below for your results today.   Medications prescribed:   None  Take any prescribed medications only as directed.   Home care instructions:  Follow any educational materials and wound care instructions contained in this packet.   Keep affected area above the level of your heart when possible to minimize swelling. Wash area gently twice a day with warm soapy water. Do not apply alcohol or hydrogen peroxide. Cover the area if it draining or weeping.   Follow-up instructions: Suture Removal: Return to the Emergency Department or see your primary care care doctor in 10 days for a recheck of your wound and removal of your sutures or staples.    Return instructions:  Return to the Emergency Department if you have:  Fever  Worsening pain  Worsening swelling of the wound  Pus draining from the wound  Redness of the skin that moves away from the wound, especially if it streaks away from the affected area   Any other emergent concerns  Your vital signs today were: BP 173/91 (BP Location: Right Arm)    Pulse 88    Temp 97.6 F (36.4 C) (Oral)    Resp 18    Ht 5\' 6"  (1.676 m)    Wt 46.7 kg    SpO2 96%    BMI 16.62 kg/m  If your blood pressure (BP) was elevated above 135/85 this visit, please have this repeated by your doctor within one month. --------------

## 2016-10-23 NOTE — ED Triage Notes (Signed)
Pt cut her left middle finger on a broken vase, she has a deep, v-shaped laceration to the middle of her finger

## 2016-10-23 NOTE — ED Notes (Signed)
Provider at bedside

## 2016-10-27 ENCOUNTER — Encounter: Payer: Self-pay | Admitting: Family Medicine

## 2016-10-27 ENCOUNTER — Ambulatory Visit (INDEPENDENT_AMBULATORY_CARE_PROVIDER_SITE_OTHER): Payer: PPO | Admitting: Family Medicine

## 2016-10-27 DIAGNOSIS — Z23 Encounter for immunization: Secondary | ICD-10-CM | POA: Diagnosis not present

## 2016-10-27 DIAGNOSIS — H2513 Age-related nuclear cataract, bilateral: Secondary | ICD-10-CM | POA: Diagnosis not present

## 2016-10-27 DIAGNOSIS — S61213D Laceration without foreign body of left middle finger without damage to nail, subsequent encounter: Secondary | ICD-10-CM

## 2016-10-27 DIAGNOSIS — S61213A Laceration without foreign body of left middle finger without damage to nail, initial encounter: Secondary | ICD-10-CM | POA: Insufficient documentation

## 2016-10-27 DIAGNOSIS — H5213 Myopia, bilateral: Secondary | ICD-10-CM | POA: Diagnosis not present

## 2016-10-27 DIAGNOSIS — H52203 Unspecified astigmatism, bilateral: Secondary | ICD-10-CM | POA: Diagnosis not present

## 2016-10-27 NOTE — Assessment & Plan Note (Signed)
Patient is here for a laceration of her left third digit. Patient appropriately received sutures in the ED on the day of injury. It appears as though she has a very mild infectious reaction occurring at the site of injury. - Due to patient's allergies (bacitracin): Bactroban ointment 3 times a day - Keep clean and dry - Buddy tape for proprioceptive protection - Ibuprofen/Tylenol as needed for discomfort - Return 1 week from injury for suture removal and wound recheck - No need for oral antibiotics at this time  Next: Return precautions provided to patient. If she experiences these symptoms she was informed to seek reevaluation in the clinic. If symptoms present over the weekend she is to call the emergency call line. 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 Doxycycline, clinda, or Bactrim would not be inappropriate.

## 2016-10-27 NOTE — Progress Notes (Signed)
   HPI  CC: Left third digit laceration Patient is here after suffering a laceration of her left third digit. She was seen on 11/23 in the ED for this issue. At that time she had this laceration sutured and was sent home. Since that time she says that the site has began to swell and get relatively more tender, especially over the past 24 hours. She denies any significant drainage but states that's it is now more red than it used to be, swollen, and occasionally throbs. She denies any fever, headache, blurred vision, nausea, vomiting, diarrhea, lightheadedness, or extension of pain significantly beyond the affected site.  Review of Systems    See HPI for ROS. All other systems reviewed and are negative.  CC, SH/smoking status, and VS noted  Objective: BP (!) 175/80   Pulse 80   Temp 97.9 F (36.6 C) (Oral)   Wt 106 lb (48.1 kg)   BMI 17.11 kg/m  Gen: NAD, alert, cooperative. CV: Well-perfused. Resp: Non-labored. Neuro: Sensation intact throughout. Integument: 2 cm transverse laceration noted on the radial aspect of the third digit just distal to the PIP. 4 nylon sutures in place. Some granulation tissue noted along the midportion of the laceration, but no drainage or purulence noted. Surrounding erythema extending 5-7 mm from the laceration site, and obvious swelling in this side as well. ROM intact of MCP joint, PIP, and DIP. No pain with ROM, but the sensation of "tightness" present. [See photos below]       Assessment and plan:  Laceration of left middle finger Patient is here for a laceration of her left third digit. Patient appropriately received sutures in the ED on the day of injury. It appears as though she has a very mild infectious reaction occurring at the site of injury. - Due to patient's allergies (bacitracin): Bactroban ointment 3 times a day - Keep clean and dry - Buddy tape for proprioceptive protection - Ibuprofen/Tylenol as needed for discomfort - Return 1 week  from injury for suture removal and wound recheck - No need for oral antibiotics at this time  Next: Return precautions provided to patient. If she experiences these symptoms she was informed to seek reevaluation in the clinic. If symptoms present over the weekend she is to call the emergency call line. 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 Doxycycline, clinda, or Bactrim would not be inappropriate.   Orders Placed This Encounter  Procedures  . Flu Vaccine QUAD 36+ mos IM    Elberta Leatherwood, MD,MS,  PGY3 10/27/2016 5:31 PM

## 2016-10-27 NOTE — Patient Instructions (Signed)
How to Change Your Dressing Introduction A dressing is a material that is placed in and over wounds. A dressing helps your wound to heal by protecting it from:  Bacteria.  Worse injury.  Being too dry or too wet. What are the risks? The sticky (adhesive) tape that is used with a dressing may make your skin sore or irritated, or it may cause a rash. These are the most common problems. However, more serious problems can develop, such as:  Bleeding.  Infection. How to change your dressing Getting Ready to Change Your Dressing   Take a shower before you do the first dressing change of the day. If your doctor does not want your wound to get wet and your dressing is not waterproof, you may need to put plastic leak-proof sealing wrap on your dressing to protect it.  If needed, take pain medicine as told by your doctor 30 minutes before you change your dressing.  Set up a clean station for wound care. You will need:  A plastic trash bag that is open and ready to use.  Hand sanitizer.  Wound cleanser or salt-water solution (saline) as told by your doctor.  New dressing material or bandages. Make sure to open the dressing package so the dressing stays on the inside of the package. You may also need these supplies in your clean station:  A box of vinyl gloves.  Tape.  Skin protectant. This may be a wipe, film, or spray.  Clean or germ-free (sterile) scissors.  A cotton-tipped applicator. Taking Off Your Old Dressing  Wash your hands with soap and water. Dry your hands with a clean towel. If you cannot use soap and water, use hand sanitizer.  If you are using gloves, put on the gloves before you take off the dressing.  Gently take off any adhesive or tape by pulling it off in the direction of your hair growth. Only touch the outside edges of the dressing.  Take off the dressing. If the dressing sticks to your skin, wet the dressing with a germ-free salt-water solution. This  helps it come off more easily.  Take off any gauze or packing in your wound.  Throw the old dressing supplies into the ready trash bag.  Take off your gloves. To take off each glove, grab the cuff with your other hand and turn the glove inside out. Put the gloves in the trash right away.  Wash your hands with soap and water. Dry your hands with a clean towel. If you cannot use soap and water, use hand sanitizer. Cleaning Your Wound  Follow instructions from your doctor about how to clean your wound. This may include using a salt-water solution or recommended wound cleanser.  Do not use over-the-counter medicated or antiseptic creams, sprays, liquids, or dressings unless your doctor tells you to do that.  Use a clean gauze pad to clean the area fully with the salt-water solution or wound cleanser that your doctor recommends.  Throw the gauze pad into the trash bag.  Wash your hands with soap and water. Dry your hands with a clean towel. If you cannot use soap and water, use hand sanitizer. Putting on the Dressing  If your doctor recommended a skin protectant, put it on the skin around the wound.  Cover the wound with the recommended dressing, such as a nonstick gauze or bandage. Make sure to touch only the outside edges of the dressing. Do not touch the inside of the dressing.  Attach   the dressing so all sides stay in place. You may do this with the attached medical adhesive, roll gauze, or tape. If you use tape, do not wrap the tape all the way around your arm or leg.  Take off your gloves. Put them in the trash bag with the old dressing. Tie the bag shut and throw it away.  Wash your hands with soap and water. Dry your hands with a clean towel. If you cannot use soap and water, use hand sanitizer. Get help if:   You have new pain.  You have irritation, a rash, or itching around the wound or dressing.  Changing your dressing is painful.  Changing your dressing causes a lot of  bleeding. Get help right away if:  You have very bad pain.  You have signs of infection, such as:  More redness, swelling, or pain.  More fluid or blood.  Warmth.  Pus or a bad smell.  Red streaks leading from wound.  A fever. This information is not intended to replace advice given to you by your health care provider. Make sure you discuss any questions you have with your health care provider. Document Released: 02/13/2009 Document Revised: 04/24/2016 Document Reviewed: 08/23/2015  2017 Elsevier  

## 2016-10-28 ENCOUNTER — Other Ambulatory Visit: Payer: Self-pay | Admitting: Internal Medicine

## 2016-10-28 NOTE — Progress Notes (Signed)
[facility] After Hours Telephone Line   Number received via page: [phone] Person calling: Annaclaire Martinique Relationship to patient: Self  Reason for call: Worsening skin infection  Patient seen in office yesterday for concern for infection at site of L third digit laceration. At that time, oral antibiotics were not indicated, and patient was instructed to begin using bactroban ointment TID and return in one week. Patient was given strict return precautions, and is calling tonight as she has started to experience some of the symptoms discussed. Patient now notes increased pain and redness at the laceration site, despite using bactroban as prescribed. She is requesting PO antibiotics. Patient without fevers and chills. No drainage from the site. Per conversation with patient, she is stable enough to not require urgent evaluation tonight. Called in Keflex 500mg  QID x7d to [facility] on [address] (unable to enter prescription into computer due to Epic errors). Keflex chosen over doxy, clinda, and bactrim due to allergies. Of note, patient did have hives after taking cefaclor over 20 years ago, but said she would prefer to try Keflex at this time despite prior urticarial reaction. Patient to follow up in clinic in one week as scheduled.   Adin Hector, MD, MPH PGY-2 [facility] Pager [phone]

## 2016-11-04 ENCOUNTER — Ambulatory Visit (INDEPENDENT_AMBULATORY_CARE_PROVIDER_SITE_OTHER): Payer: PPO | Admitting: *Deleted

## 2016-11-04 DIAGNOSIS — Z4802 Encounter for removal of sutures: Secondary | ICD-10-CM

## 2016-11-04 DIAGNOSIS — S61213D Laceration without foreign body of left middle finger without damage to nail, subsequent encounter: Secondary | ICD-10-CM

## 2016-11-04 NOTE — Progress Notes (Signed)
   Patient in nurse clinic for suture removal of left middle finger.  Patient had cut to finger little over a week ago.  Sutures placed by ED.  Patient is currently taken antibiotics.  Patient had 4 sutures in place.  Area cleaned with betadine and alcohol.  Four sutures removed without difficulty.  Derl Barrow, RN

## 2016-11-10 ENCOUNTER — Encounter: Payer: Self-pay | Admitting: Sports Medicine

## 2016-11-10 ENCOUNTER — Other Ambulatory Visit: Payer: Self-pay | Admitting: *Deleted

## 2016-11-10 ENCOUNTER — Ambulatory Visit (INDEPENDENT_AMBULATORY_CARE_PROVIDER_SITE_OTHER): Payer: PPO | Admitting: Sports Medicine

## 2016-11-10 VITALS — BP 143/76 | HR 94 | Ht 66.0 in | Wt 106.0 lb

## 2016-11-10 DIAGNOSIS — R200 Anesthesia of skin: Secondary | ICD-10-CM

## 2016-11-10 DIAGNOSIS — R202 Paresthesia of skin: Secondary | ICD-10-CM | POA: Diagnosis not present
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
## 2016-12-09 MED FILL — FOLIC ACID 1 MG TABLET: 1 | 90 days supply | Qty: 90 | Fill #2

## 2017-01-16 DIAGNOSIS — R636 Underweight: Secondary | ICD-10-CM | POA: Diagnosis not present

## 2017-01-16 DIAGNOSIS — Z8781 Personal history of (healed) traumatic fracture: Secondary | ICD-10-CM | POA: Diagnosis not present

## 2017-01-16 DIAGNOSIS — M810 Age-related osteoporosis without current pathological fracture: Secondary | ICD-10-CM | POA: Diagnosis not present

## 2017-01-16 MED FILL — ESTRADIOL 0.1 MG/GM CRM: 0.1 | 90 days supply | Qty: 43 | Fill #2

## 2017-01-16 MED FILL — AMLODIPINE BESYLATE 5 MG TA: 5 | 90 days supply | Qty: 90 | Fill #2

## 2017-01-26 DIAGNOSIS — N183 Chronic kidney disease, stage 3 (moderate): Secondary | ICD-10-CM | POA: Diagnosis not present

## 2017-03-18 ENCOUNTER — Encounter: Payer: Self-pay | Admitting: Family Medicine

## 2017-03-18 ENCOUNTER — Ambulatory Visit (INDEPENDENT_AMBULATORY_CARE_PROVIDER_SITE_OTHER): Payer: PPO | Admitting: Family Medicine

## 2017-03-18 VITALS — BP 160/86 | HR 84 | Resp 16 | Wt 105.0 lb

## 2017-03-18 DIAGNOSIS — M999 Biomechanical lesion, unspecified: Secondary | ICD-10-CM | POA: Diagnosis not present

## 2017-03-18 DIAGNOSIS — M533 Sacrococcygeal disorders, not elsewhere classified: Secondary | ICD-10-CM | POA: Diagnosis not present

## 2017-03-18 NOTE — Progress Notes (Signed)
Pre-visit discussion using our clinic review tool. No additional management support is needed unless otherwise documented below in the visit note.  

## 2017-03-18 NOTE — Patient Instructions (Signed)
Good to see you  Ice is your friend  Will focus on hip strength  Vitamin D will always be key for you  See me again in 6 weeks!

## 2017-03-18 NOTE — Assessment & Plan Note (Signed)
Decision today to treat with OMT was based on Physical Exam  After verbal consent patient was treated with HVLA, ME, FPR techniques in cervical, thoracic, lumbar and sacral areas  Patient tolerated the procedure well with improvement in symptoms  Patient given exercises, stretches and lifestyle modifications  See medications in patient instructions if given  Patient will follow up in 6 weeks 

## 2017-03-18 NOTE — Progress Notes (Signed)
[name] [place] [address] [place], [place] [address] Phone: [phone] Subjective:     CC: Left hip pain follow up sacroiliac dysfunction follow-up  [id number]  Tracey [name] is a 66 y.o. female coming in with complaint of left hip pain. Patient has not been seen for 1 year. Patient did have a closed head fracture. Didn't need pinning. This was one year ago. Patient has started to increase activity again. Since then has started having some increasing lower back pain. Patient states that it is very mild overall but does not want it to get worse. Not stopping her from activity. Denies any numbness or radiation. Patient states not taking the medicines on a regular basis.  Past Medical History:  Diagnosis Date  . Closed hip fracture ([facility]) 03/2016   RT HIP  . Hypertension   . Osteopenia   . Osteoporosis    h/o  . Skin cancer    multiple skin cancers   Past Surgical History:  Procedure Laterality Date  . CALDWELL LUC    . COLONOSCOPY WITH PROPOFOL N/A 03/22/2014   Procedure: COLONOSCOPY WITH PROPOFOL;  Surgeon: Arta Silence, MD;  Location: [facility];  Service: Endoscopy;  Laterality: N/A;  . HIP PINNING,CANNULATED Right 03/21/2016   Procedure: CANNULATED HIP PINNING;  Surgeon: Rod Can, MD;  Location: [facility];  Service: Orthopedics;  Laterality: Right;  . NASAL SEPTUM SURGERY     past hx. deviated septum  . TONSILLECTOMY     Social History  Substance Use Topics  . Smoking status: Never Smoker  . Smokeless tobacco: Never Used  . Alcohol use 0.0 oz/week   Allergies  Allergen Reactions  . Cefaclor     REACTION: urticaria (hives)  . Neomycin-Bacitracin Zn-Polymyx     REACTION: rash   Family History  Problem Relation Age of Onset  . Hypertension Mother   . Leukemia Father   . Hypertension Father   . Diabetes Brother   . Hypertension Brother   . Hyperlipidemia Maternal Grandmother   . Cancer Maternal Grandfather   . Heart disease  Paternal Grandfather   . Stroke Paternal Grandfather     Past medical history, social, surgical and family history all reviewed in electronic medical record.   Review of Systems: No headache, visual changes, nausea, vomiting, diarrhea, constipation, dizziness, abdominal pain, skin rash, fevers, chills, night sweats, weight loss, swollen lymph nodes, body aches, joint swelling, chest pain, shortness of breath, mood changes.  Positive muscle aches  Objective  Blood pressure (!) 160/86, pulse 84, resp. rate 16, weight 105 lb (47.6 kg), SpO2 99 %.  Systems examined below as of 03/18/17 General: NAD A&O x3 mood, affect normal  HEENT: Pupils equal, extraocular movements intact no nystagmus Respiratory: not short of breath at rest or with speaking Cardiovascular: No lower extremity edema, non tender Skin: Warm dry intact with no signs of infection or rash on extremities or on axial skeleton. Abdomen: Soft nontender, no masses Neuro: Cranial nerves  intact, neurovascularly intact in all extremities with 2+ DTRs and 2+ pulses. Lymph: No lymphadenopathy appreciated today  Gait normal with good balance and coordination.  MSK: Non tender with full range of motion and good stability and symmetric strength and tone of shoulders, elbows, wrist,  knee hips and ankles bilaterally.  Arthritic changes of multiple joints Back Exam:  Inspection: Unremarkable  Motion: Flexion 45 deg, Extension 25 deg, Side Bending to 45 deg bilaterally,  Rotation to 45 deg bilaterally  SLR laying:  Negative mild tightness of the right hamstring XSLR laying: Negative  Palpable tenderness: Tenderness to palpation of the sacrum in joint bilaterally. FABER: Tightness on the right side. Sensory change: Gross sensation intact to all lumbar and sacral dermatomes.  Reflexes: 2+ at both patellar tendons, 2+ at achilles tendons, Babinski's downgoing.  Strength at foot  Plantar-flexion: 5/5 Dorsi-flexion: 5/5 Eversion: 5/5 Inversion:  5/5  Leg strength  Quad: 5/5 Hamstring: 5/5 Hip flexor: 5/5 Hip abductors: 5/5  Gait unremarkable.  .  Osteopathic findings Cervical C2 flexed rotated and side bent right C4 flexed rotated and side bent left C6 flexed rotated and side bent left T3 extended rotated and side bent right inhaled third rib T9 extended rotated and side bent left L2 flexed rotated and side bent right Sacrum right on right     Impression and Recommendations:     This case required medical decision making of moderate complexity.

## 2017-03-18 NOTE — Assessment & Plan Note (Signed)
Sacroiliac Joint Mobilization and Rehab 1. Work on pretzel stretching, shoulder back and leg draped in front. 3-5 sets, 30 sec.. 2. hip abductor rotations. standing, hip flexion and rotation outward then inward. 3 sets, 15 reps. when can do comfortably, add ankle weights starting at 2 pounds.  3. cross over stretching - shoulder back to ground, same side leg crossover. 3-5 sets for 30 min..  4. rolling up and back knees to chest and rocking. 5. sacral tilt - 5 sets, hold for 5-10 seconds Return to clinic in 6 weeks

## 2017-03-24 MED FILL — FOLIC ACID 1 MG TABLET: 1 | 90 days supply | Qty: 90 | Fill #3

## 2017-04-14 DIAGNOSIS — L821 Other seborrheic keratosis: Secondary | ICD-10-CM | POA: Diagnosis not present

## 2017-04-14 DIAGNOSIS — Z85820 Personal history of malignant melanoma of skin: Secondary | ICD-10-CM | POA: Diagnosis not present

## 2017-04-14 DIAGNOSIS — L910 Hypertrophic scar: Secondary | ICD-10-CM | POA: Diagnosis not present

## 2017-04-14 DIAGNOSIS — D225 Melanocytic nevi of trunk: Secondary | ICD-10-CM | POA: Diagnosis not present

## 2017-04-14 DIAGNOSIS — D692 Other nonthrombocytopenic purpura: Secondary | ICD-10-CM | POA: Diagnosis not present

## 2017-04-14 DIAGNOSIS — L570 Actinic keratosis: Secondary | ICD-10-CM | POA: Diagnosis not present

## 2017-04-14 DIAGNOSIS — D2262 Melanocytic nevi of left upper limb, including shoulder: Secondary | ICD-10-CM | POA: Diagnosis not present

## 2017-04-14 DIAGNOSIS — D2271 Melanocytic nevi of right lower limb, including hip: Secondary | ICD-10-CM | POA: Diagnosis not present

## 2017-04-14 DIAGNOSIS — Z85828 Personal history of other malignant neoplasm of skin: Secondary | ICD-10-CM | POA: Diagnosis not present

## 2017-04-14 DIAGNOSIS — D2272 Melanocytic nevi of left lower limb, including hip: Secondary | ICD-10-CM | POA: Diagnosis not present

## 2017-04-14 DIAGNOSIS — D485 Neoplasm of uncertain behavior of skin: Secondary | ICD-10-CM | POA: Diagnosis not present

## 2017-04-20 MED FILL — AMLODIPINE BESYLATE 5 MG TA: 5 | 90 days supply | Qty: 90 | Fill #3

## 2017-04-29 ENCOUNTER — Encounter: Payer: Self-pay | Admitting: Family Medicine

## 2017-04-29 ENCOUNTER — Ambulatory Visit (INDEPENDENT_AMBULATORY_CARE_PROVIDER_SITE_OTHER): Payer: PPO | Admitting: Family Medicine

## 2017-04-29 VITALS — BP 124/78 | HR 74 | Ht 66.0 in | Wt 106.0 lb

## 2017-04-29 DIAGNOSIS — M533 Sacrococcygeal disorders, not elsewhere classified: Secondary | ICD-10-CM | POA: Diagnosis not present

## 2017-04-29 DIAGNOSIS — M999 Biomechanical lesion, unspecified: Secondary | ICD-10-CM

## 2017-04-29 NOTE — Assessment & Plan Note (Signed)
Decision today to treat with OMT was based on Physical Exam  After verbal consent patient was treated with HVLA, ME, FPR techniques in cervical, thoracic, lumbar and sacral areas  Patient tolerated the procedure well with improvement in symptoms  Patient given exercises, stretches and lifestyle modifications  See medications in patient instructions if given  Patient will follow up in 6-12 weeks 

## 2017-04-29 NOTE — Assessment & Plan Note (Signed)
Patient is overall. Some mild tightness more in the thoracolumbar than truly on the lumbosacral area today. We discussed again the importance of core strength as well as hip abductor stability. Encourage patient to potentially getting weight and muscle. Follow-up again in 6-12 weeks.

## 2017-04-29 NOTE — Patient Instructions (Addendum)
Tracey [name]! 6-12 weeks

## 2017-04-29 NOTE — Progress Notes (Signed)
[name] [address] [place], [place] [address] Phone: [phone] Subjective:     CC: Left hip pain follow up sacroiliac dysfunction follow-up  [id number]  Tracey [name] is a 66 y.o. female coming in with complaint of left hip pain. Patient is been seen for chronic back pain as well as hip pain. Patient's has been doing very well. Noticed some increasing tightness. Starting to affect some daily activities. Patient denies any radiation down the leg. Describes the pain as a dull, throbbing aching sensation. Has affected her sleep for the last couple nights. Denies any fever, chills, any abnormal weight loss.  Past Medical History:  Diagnosis Date  . Closed hip fracture ([facility]) 03/2016   RT HIP  . Hypertension   . Osteopenia   . Osteoporosis    h/o  . Skin cancer    multiple skin cancers   Past Surgical History:  Procedure Laterality Date  . CALDWELL LUC    . COLONOSCOPY WITH PROPOFOL N/A 03/22/2014   Procedure: COLONOSCOPY WITH PROPOFOL;  Surgeon: Arta Silence, MD;  Location: [facility];  Service: Endoscopy;  Laterality: N/A;  . HIP PINNING,CANNULATED Right 03/21/2016   Procedure: CANNULATED HIP PINNING;  Surgeon: Rod Can, MD;  Location: [facility];  Service: Orthopedics;  Laterality: Right;  . NASAL SEPTUM SURGERY     past hx. deviated septum  . TONSILLECTOMY     Social History  Substance Use Topics  . Smoking status: Never Smoker  . Smokeless tobacco: Never Used  . Alcohol use 0.0 oz/week   Allergies  Allergen Reactions  . Cefaclor     REACTION: urticaria (hives)  . Neomycin-Bacitracin Zn-Polymyx     REACTION: rash   Family History  Problem Relation Age of Onset  . Hypertension Mother   . Leukemia Father   . Hypertension Father   . Diabetes Brother   . Hypertension Brother   . Hyperlipidemia Maternal Grandmother   . Cancer Maternal Grandfather   . Heart disease Paternal Grandfather   . Stroke Paternal Grandfather      Past medical history, social, surgical and family history all reviewed in electronic medical record.   Review of Systems: No headache, visual changes, nausea, vomiting, diarrhea, constipation, dizziness, abdominal pain, skin rash, fevers, chills, night sweats, weight loss, swollen lymph nodes, body aches, joint swelling, muscle aches, chest pain, shortness of breath, mood changes.    Objective  Blood pressure 124/78, pulse 74, height 5\' 6"  (1.676 m), weight 106 lb (48.1 kg), SpO2 98 %.  Systems examined below as of 04/29/17 General: NAD A&O x3 mood, affect normal  HEENT: Pupils equal, extraocular movements intact no nystagmus Respiratory: not short of breath at rest or with speaking Cardiovascular: No lower extremity edema, non tender Skin: Warm dry intact with no signs of infection or rash on extremities or on axial skeleton. Abdomen: Soft nontender, no masses Neuro: Cranial nerves  intact, neurovascularly intact in all extremities with 2+ DTRs and 2+ pulses. Lymph: No lymphadenopathy appreciated today  MSK: Non tender with full range of motion and good stability and symmetric strength and tone of shoulders, elbows, wrist,  knee hips and ankles bilaterally.  Arthritic changes of multiple joints Back Exam:  Inspection: Unremarkable  Motion: Flexion 45 deg, Extension 25 deg, Side Bending to 45 deg bilaterally,  Rotation to 45 deg bilaterally  SLR laying: Negative  XSLR laying: Negative  Palpable tenderness: Increased tenderness of the thoracolumbar juncture record of the left. FABER: Mild tightness  bilaterally. Sensory change: Gross sensation intact to all lumbar and sacral dermatomes.  Reflexes: 2+ at both patellar tendons, 2+ at achilles tendons, Babinski's downgoing.  Strength at foot  Right 5 but symmetric .  Marland KitchenOsteopathic findings C2 flexed rotated and side bent right C7 flexed rotated and side bent left T3 extended rotated and side bent right inhaled third rib T6 extended  rotated and side bent left L2 flexed rotated and side bent right Sacrum right on right      Impression and Recommendations:     This case required medical decision making of moderate complexity.

## 2017-05-13 DIAGNOSIS — S72041D Displaced fracture of base of neck of right femur, subsequent encounter for closed fracture with routine healing: Secondary | ICD-10-CM | POA: Diagnosis not present

## 2017-05-13 MED FILL — ESTRADIOL 0.1 MG/GM CRM: 0.1 | 90 days supply | Qty: 43 | Fill #0

## 2017-06-15 ENCOUNTER — Other Ambulatory Visit: Payer: Self-pay | Admitting: Obstetrics and Gynecology

## 2017-06-15 DIAGNOSIS — N952 Postmenopausal atrophic vaginitis: Secondary | ICD-10-CM | POA: Diagnosis not present

## 2017-06-15 DIAGNOSIS — Z124 Encounter for screening for malignant neoplasm of cervix: Secondary | ICD-10-CM | POA: Diagnosis not present

## 2017-06-15 DIAGNOSIS — Z01419 Encounter for gynecological examination (general) (routine) without abnormal findings: Secondary | ICD-10-CM | POA: Diagnosis not present

## 2017-06-15 DIAGNOSIS — B373 Candidiasis of vulva and vagina: Secondary | ICD-10-CM | POA: Diagnosis not present

## 2017-06-15 DIAGNOSIS — N368 Other specified disorders of urethra: Secondary | ICD-10-CM | POA: Diagnosis not present

## 2017-06-15 DIAGNOSIS — M810 Age-related osteoporosis without current pathological fracture: Secondary | ICD-10-CM | POA: Diagnosis not present

## 2017-06-15 DIAGNOSIS — B372 Candidiasis of skin and nail: Secondary | ICD-10-CM | POA: Diagnosis not present

## 2017-06-15 DIAGNOSIS — Z681 Body mass index (BMI) 19 or less, adult: Secondary | ICD-10-CM | POA: Diagnosis not present

## 2017-06-15 DIAGNOSIS — Z8781 Personal history of (healed) traumatic fracture: Secondary | ICD-10-CM | POA: Diagnosis not present

## 2017-06-15 MED FILL — FLUCONAZOLE 150 MG TABLET: 150 | 1 days supply | Qty: 1 | Fill #0

## 2017-06-17 ENCOUNTER — Ambulatory Visit (INDEPENDENT_AMBULATORY_CARE_PROVIDER_SITE_OTHER): Payer: PPO | Admitting: Family Medicine

## 2017-06-17 ENCOUNTER — Encounter: Payer: Self-pay | Admitting: Family Medicine

## 2017-06-17 VITALS — BP 122/76 | HR 76 | Resp 16 | Wt 103.0 lb

## 2017-06-17 DIAGNOSIS — M533 Sacrococcygeal disorders, not elsewhere classified: Secondary | ICD-10-CM | POA: Diagnosis not present

## 2017-06-17 DIAGNOSIS — M999 Biomechanical lesion, unspecified: Secondary | ICD-10-CM

## 2017-06-17 NOTE — Progress Notes (Signed)
[name] [place], [place] [address] Phone: [phone] Subjective:     CC: low back pain   [id number]  Tracey [name] is a 66 y.o. female coming in with complaint of left low back pain Using patient before for sacroiliac dysfunction. Patient does remain active. Trying to increase activity as tolerated. Patient continues to have some discomfort from time to time. Patient denies any numbness. States that there is some increasing tightness. Continues to bike right on a regular basis.  Past Medical History:  Diagnosis Date  . Closed hip fracture ([place]) 03/2016   RT HIP  . Hypertension   . Osteopenia   . Osteoporosis    h/o  . Skin cancer    multiple skin cancers   Past Surgical History:  Procedure Laterality Date  . CALDWELL LUC    . COLONOSCOPY WITH PROPOFOL N/A 03/22/2014   Procedure: COLONOSCOPY WITH PROPOFOL;  Surgeon: Arta Silence, MD;  Location: [facility];  Service: Endoscopy;  Laterality: N/A;  . HIP PINNING,CANNULATED Right 03/21/2016   Procedure: CANNULATED HIP PINNING;  Surgeon: Rod Can, MD;  Location: [facility];  Service: Orthopedics;  Laterality: Right;  . NASAL SEPTUM SURGERY     past hx. deviated septum  . TONSILLECTOMY     Social History  Substance Use Topics  . Smoking status: Never Smoker  . Smokeless tobacco: Never Used  . Alcohol use 0.0 oz/week   Allergies  Allergen Reactions  . Cefaclor     REACTION: urticaria (hives)  . Neomycin-Bacitracin Zn-Polymyx     REACTION: rash   Family History  Problem Relation Age of Onset  . Hypertension Mother   . Leukemia Father   . Hypertension Father   . Diabetes Brother   . Hypertension Brother   . Hyperlipidemia Maternal Grandmother   . Cancer Maternal Grandfather   . Heart disease Paternal Grandfather   . Stroke Paternal Grandfather     Past medical history, social, surgical and family history all reviewed in electronic medical record.   Review of  Systems: No headache, visual changes, nausea, vomiting, diarrhea, constipation, dizziness, abdominal pain, skin rash, fevers, chills, night sweats, weight loss, swollen lymph nodes, body aches, joint swelling,chest pain, shortness of breath, mood changes.  Positive muscle aches   Objective  Blood pressure 122/76, pulse 76, resp. rate 16, weight 103 lb (46.7 kg), SpO2 96 %.  Systems examined below as of 06/17/17 General: NAD A&O x3 mood, affect normal  HEENT: Pupils equal, extraocular movements intact no nystagmus Respiratory: not short of breath at rest or with speaking Cardiovascular: No lower extremity edema, non tender Skin: Warm dry intact with no signs of infection or rash on extremities or on axial skeleton. Abdomen: Soft nontender, no masses Neuro: Cranial nerves  intact, neurovascularly intact in all extremities with 2+ DTRs and 2+ pulses. Lymph: No lymphadenopathy appreciated today  Gait normal with good balance and coordination.  MSK: Non tender with full range of motion and good stability and symmetric strength and tone of shoulders, elbows, wrist,  knee hips and ankles bilaterally.  Arthritic changes of multiple joints Back Exam:  Inspection: Mild loss of lordosis Motion: Flexion 40 deg, Extension 25 deg, Side Bending to 40 deg bilaterally,  Rotation to 40 deg bilaterally  SLR laying: Negative  XSLR laying: Negative  Palpable tenderness: Tender to palpation in the paraspinal musculature mostly in the thoracolumbar juncture in the lumbosacral juncture FABER: Mild tightness bilaterally. Sensory change: Gross sensation intact  to all lumbar and sacral dermatomes.  Reflexes: 2+ at both patellar tendons, 2+ at achilles tendons, Babinski's downgoing.  Strength at foot  4-5 but symmetric .  Osteopathic findings C2 flexed rotated and side bent right C4 flexed rotated and side bent left C7 flexed rotated and side bent left T3 extended rotated and side bent right inhaled third  rib T9 extended rotated and side bent left L3 flexed rotated and side bent right Sacrum right on right       Impression and Recommendations:     This case required medical decision making of moderate complexity.

## 2017-06-17 NOTE — Assessment & Plan Note (Signed)
Discussed with patient about the importance of muscle strength. We discussed diet, we discussed that if any increasing weight loss. We discussed home exercises. Discussed the importance of hip abductor strengthening. We discussed over-the-counter medications a could help with any type of swelling. Patient will come back and see me again in 4-6 weeks.

## 2017-06-17 NOTE — Patient Instructions (Signed)
6 weeks

## 2017-06-17 NOTE — Assessment & Plan Note (Signed)
Decision today to treat with OMT was based on Physical Exam  After verbal consent patient was treated with  ME, FPR techniques in cervical, thoracic, lumbar and sacral areas  Patient tolerated the procedure well with improvement in symptoms  Patient given exercises, stretches and lifestyle modifications  See medications in patient instructions if given  Patient will follow up in 6 weeks 

## 2017-06-19 LAB — CYTOLOGY - PAP

## 2017-06-24 MED FILL — FOLIC ACID 1 MG TABLET: 1 | 90 days supply | Qty: 90 | Fill #0

## 2017-07-21 MED FILL — AMLODIPINE BESYLATE 5 MG TA: 5 | 90 days supply | Qty: 90 | Fill #0

## 2017-07-28 ENCOUNTER — Ambulatory Visit (INDEPENDENT_AMBULATORY_CARE_PROVIDER_SITE_OTHER): Payer: PPO | Admitting: Family Medicine

## 2017-07-28 ENCOUNTER — Encounter: Payer: Self-pay | Admitting: Family Medicine

## 2017-07-28 VITALS — BP 138/80 | HR 84 | Ht 66.0 in | Wt 103.0 lb

## 2017-07-28 DIAGNOSIS — M999 Biomechanical lesion, unspecified: Secondary | ICD-10-CM

## 2017-07-28 DIAGNOSIS — M533 Sacrococcygeal disorders, not elsewhere classified: Secondary | ICD-10-CM | POA: Diagnosis not present

## 2017-07-28 MED ORDER — CYCLOBENZAPRINE HCL 10 MG PO TABS: 5.0000 mg | ORAL_TABLET | Freq: Every day | ORAL | 3 refills | Status: DC

## 2017-07-28 MED FILL — CYCLOBENZAPRINE 10 MG TAB: 10 | 30 days supply | Qty: 30 | Fill #0

## 2017-07-28 NOTE — Assessment & Plan Note (Signed)
Second dysfunction. Has responded well to manipulation. No significant changes in management. Follow-up again in 6-8 weeks

## 2017-07-28 NOTE — Progress Notes (Signed)
[name] [place], [place] [address] Phone: [phone] Subjective:     CC: low back pain   [id number]  Tracey [name] is a 66 y.o. female coming in with complaint of left low back pain Using patient before for sacroiliac dysfunction.Continues to do well overall. Feels that the 6-8 week intervals seems to be very beneficial. No new symptoms. Just more tightness recently.  Past Medical History:  Diagnosis Date  . Closed hip fracture ([place]) 03/2016   RT HIP  . Hypertension   . Osteopenia   . Osteoporosis    h/o  . Skin cancer    multiple skin cancers   Past Surgical History:  Procedure Laterality Date  . CALDWELL LUC    . COLONOSCOPY WITH PROPOFOL N/A 03/22/2014   Procedure: COLONOSCOPY WITH PROPOFOL;  Surgeon: Arta Silence, MD;  Location: [facility];  Service: Endoscopy;  Laterality: N/A;  . HIP PINNING,CANNULATED Right 03/21/2016   Procedure: CANNULATED HIP PINNING;  Surgeon: Rod Can, MD;  Location: [facility];  Service: Orthopedics;  Laterality: Right;  . NASAL SEPTUM SURGERY     past hx. deviated septum  . TONSILLECTOMY     Social History  Substance Use Topics  . Smoking status: Never Smoker  . Smokeless tobacco: Never Used  . Alcohol use 0.0 oz/week   Allergies  Allergen Reactions  . Cefaclor     REACTION: urticaria (hives)  . Neomycin-Bacitracin Zn-Polymyx     REACTION: rash   Family History  Problem Relation Age of Onset  . Hypertension Mother   . Leukemia Father   . Hypertension Father   . Diabetes Brother   . Hypertension Brother   . Hyperlipidemia Maternal Grandmother   . Cancer Maternal Grandfather   . Heart disease Paternal Grandfather   . Stroke Paternal Grandfather     Past medical history, social, surgical and family history all reviewed in electronic medical record.   Review of Systems: No headache, visual changes, nausea, vomiting, diarrhea, constipation, dizziness, abdominal pain, skin  rash, fevers, chills, night sweats, weight loss, swollen lymph nodes, body aches, joint swelling, muscle aches, chest pain, shortness of breath, mood changes.    Objective  Blood pressure 138/80, pulse 84, height 5\' 6"  (1.676 m), weight 103 lb (46.7 kg).  Systems examined below as of 07/28/17 General: NAD A&O x3 mood, affect normal  HEENT: Pupils equal, extraocular movements intact no nystagmus Respiratory: not short of breath at rest or with speaking Cardiovascular: No lower extremity edema, non tender Skin: Warm dry intact with no signs of infection or rash on extremities or on axial skeleton. Abdomen: Soft nontender, no masses Neuro: Cranial nerves  intact, neurovascularly intact in all extremities with 2+ DTRs and 2+ pulses. Lymph: No lymphadenopathy appreciated today  Gait normal with good balance and coordination.  MSK: Non tender with full range of motion and good stability and symmetric strength and tone of shoulders, elbows, wrist,  knee hips and ankles bilaterally.  Arthritic changes of multiple joints Back Exam:  Inspection: Degenerative scoliosis noted of the lumbar spine Motion: Flexion 35 deg, Extension 25 deg, Side Bending to 25 deg bilaterally,  Rotation to 25 deg bilaterally  SLR laying: Negative  XSLR laying: Negative  Palpable tenderness: Tender to palpation and appears palmar musculature lumbar spine right greater than left. FABER: Tightness bilaterally. Sensory change: Gross sensation intact to all lumbar and sacral dermatomes.  Reflexes: 2+ at both patellar tendons, 2+ at achilles tendons, Babinski's downgoing.  Strength at foot  Plantar-flexion: 5/5 Dorsi-flexion: 5/5 Eversion: 5/5 Inversion: 5/5  Leg strength  Quad: 5/5 Hamstring: 5/5 Hip flexor: 5/5 Hip abductors: 5/5  Gait unremarkable.  Osteopathic findings C2 flexed rotated and side bent right C4 flexed rotated and side bent left C7 flexed rotated and side bent left T3 extended rotated and side bent  right inhaled third rib T11 extended rotated and side bent left L3 flexed rotated and side bent right Sacrum right on right     Impression and Recommendations:     This case required medical decision making of moderate complexity.

## 2017-07-28 NOTE — Assessment & Plan Note (Signed)
Decision today to treat with OMT was based on Physical Exam  After verbal consent patient was treated with HVLA, ME, FPR techniques in cervical, thoracic, lumbar and sacral areas  Patient tolerated the procedure well with improvement in symptoms  Patient given exercises, stretches and lifestyle modifications  See medications in patient instructions if given  Patient will follow up in 6-8 weeks 

## 2017-07-28 NOTE — Patient Instructions (Signed)
awesome [place] see you [place] See em again in 6-7 weeks!

## 2017-07-30 ENCOUNTER — Other Ambulatory Visit: Payer: Self-pay | Admitting: Obstetrics and Gynecology

## 2017-07-30 DIAGNOSIS — Z1231 Encounter for screening mammogram for malignant neoplasm of breast: Secondary | ICD-10-CM

## 2017-08-04 DIAGNOSIS — I10 Essential (primary) hypertension: Secondary | ICD-10-CM | POA: Diagnosis not present

## 2017-08-04 DIAGNOSIS — M810 Age-related osteoporosis without current pathological fracture: Secondary | ICD-10-CM | POA: Diagnosis not present

## 2017-08-04 DIAGNOSIS — Z0000 Encounter for general adult medical examination without abnormal findings: Secondary | ICD-10-CM | POA: Diagnosis not present

## 2017-08-25 MED FILL — metroNIDAZOLE 1 % GEL: 1 | 30 days supply | Qty: 60 | Fill #0

## 2017-09-08 ENCOUNTER — Ambulatory Visit
Admission: RE | Admit: 2017-09-08 | Discharge: 2017-09-08 | Disposition: A | Discharge status: Home / Self Care | Payer: PPO | Source: Ambulatory Visit | Attending: Obstetrics and Gynecology | Admitting: Obstetrics and Gynecology

## 2017-09-08 ENCOUNTER — Encounter: Payer: Self-pay | Admitting: Family Medicine

## 2017-09-08 ENCOUNTER — Ambulatory Visit: Payer: PPO

## 2017-09-08 ENCOUNTER — Ambulatory Visit (INDEPENDENT_AMBULATORY_CARE_PROVIDER_SITE_OTHER): Payer: PPO | Admitting: Family Medicine

## 2017-09-08 VITALS — BP 150/80 | HR 100 | Ht 66.0 in | Wt 105.0 lb

## 2017-09-08 DIAGNOSIS — Z1231 Encounter for screening mammogram for malignant neoplasm of breast: Secondary | ICD-10-CM | POA: Diagnosis not present

## 2017-09-08 DIAGNOSIS — M533 Sacrococcygeal disorders, not elsewhere classified: Secondary | ICD-10-CM | POA: Diagnosis not present

## 2017-09-08 DIAGNOSIS — M999 Biomechanical lesion, unspecified: Secondary | ICD-10-CM

## 2017-09-08 NOTE — Assessment & Plan Note (Signed)
Stable. Not do think that we are more in the maintenance phase. Can see patient in 6-8 weeks. Encourage patient to continue to watch his diet as well as ergonomics and home exercises. Follow-up again in 6-8 weeks

## 2017-09-08 NOTE — Progress Notes (Signed)
[name] [place], [place] [address] Phone: [phone] Subjective:    I'm seeing this patient by the request  of:    CC: Neck and back pain  [id number]  Tracey [name] is a 66 y.o. female coming in with complaint of neck and back pain. We seen patient multiple times. Has responded well to osteopathic manipulation. Having worsening discomfort. Has been not seen for 6-8 weeks. Went on a cruise. Has not been quite as active as usual.      Past Medical History:  Diagnosis Date  . Closed hip fracture ([place]) 03/2016   RT HIP  . Hypertension   . Osteopenia   . Osteoporosis    h/o  . Skin cancer    multiple skin cancers   Past Surgical History:  Procedure Laterality Date  . CALDWELL LUC    . COLONOSCOPY WITH PROPOFOL N/A 03/22/2014   Procedure: COLONOSCOPY WITH PROPOFOL;  Surgeon: Arta Silence, MD;  [facility]: [facility];  Service: Endoscopy;  Laterality: N/A;  . HIP PINNING,CANNULATED Right 03/21/2016   Procedure: CANNULATED HIP PINNING;  Surgeon: Rod Can, MD;  Location: [facility];  Service: Orthopedics;  Laterality: Right;  . NASAL SEPTUM SURGERY     past hx. deviated septum  . TONSILLECTOMY     Social History   Social History  . Marital status: Married    Spouse name: [name]  . Number of children: 1  . Years of education: N/A   Occupational History  . MED [employer]   Social History Main Topics  . Smoking status: Never Smoker  . Smokeless tobacco: Never Used  . Alcohol use 0.0 oz/week  . Drug use: No  . Sexual activity: Yes    Partners: Male     Comment: married, [place]   Other Topics Concern  . None   Social History Narrative  . None   Allergies  Allergen Reactions  . Cefaclor     REACTION: urticaria (hives)  . Neomycin-Bacitracin Zn-Polymyx     REACTION: rash   Family History  Problem Relation Age of Onset  . Hypertension Mother   . Leukemia Father   . Hypertension Father   .  Diabetes Brother   . Hypertension Brother   . Hyperlipidemia Maternal Grandmother   . Cancer Maternal Grandfather   . Heart disease Paternal Grandfather   . Stroke Paternal Grandfather      Past medical history, social, surgical and family history all reviewed in electronic medical record.  No pertanent information unless stated regarding to the chief complaint.   Review of Systems:Review of systems updated and as accurate as of 09/08/17  No headache, visual changes, nausea, vomiting, diarrhea, constipation, dizziness, abdominal pain, skin rash, fevers, chills, night sweats, weight loss, swollen lymph nodes, body aches, joint swelling, muscle aches, chest pain, shortness of breath, mood changes.   Objective  Blood pressure (!) 150/80, pulse 100, height 5\' 6"  (1.676 m), weight 105 lb (47.6 kg), SpO2 97 %. Systems examined below as of 09/08/17   General: No apparent distress alert and oriented x3 mood and affect normal, dressed appropriately.  Underweight  HEENT: Pupils equal, extraocular movements intact  Respiratory: Patient's speak in full sentences and does not appear short of breath  Cardiovascular: No lower extremity edema, non tender, no erythema  Skin: Warm dry intact with no signs of infection or rash on extremities or on axial skeleton.  Abdomen: Soft nontender  Neuro: Cranial nerves  II through XII are intact, neurovascularly intact in all extremities with 2+ DTRs and 2+ pulses.  Lymph: No lymphadenopathy of posterior or anterior cervical chain or axillae bilaterally.  Gait normal with good balance and coordination.  MSK:  Non tender with full range of motion and good stability and symmetric strength and tone of shoulders, elbows, wrist, hip, knee and ankles bilaterally. Arthritic changes of multiple joints Back Exam:  Inspection: Mild degenerative scoliosis Motion: Flexion 45 deg, Extension 25 deg, Side Bending to 45 deg bilaterally,  Rotation to 45 deg bilaterally  SLR  laying: Negative  XSLR laying: Negative  Palpable tenderness: Diffuse tenderness in the paraspinal musculature lumbar spine. FABER: Mild tightness on the right. Sensory change: Gross sensation intact to all lumbar and sacral dermatomes.  Reflexes: 2+ at both patellar tendons, 2+ at achilles tendons, Babinski's downgoing.  Strength at foot  Plantar-flexion: 5/5 Dorsi-flexion: 5/5 Eversion: 5/5 Inversion: 5/5  Leg strength  Quad: 5/5 Hamstring: 5/5 Hip flexor: 5/5 Hip abductors: 5/5  Gait unremarkable.  Osteopathic findings C3 flexed rotated and side bent left T3 extended rotated and side bent right inhaled third rib T8 extended rotated and side bent right L2 flexed rotated and side bent right Sacrum right on right    Impression and Recommendations:     This case required medical decision making of moderate complexity.      Note: This dictation was prepared with Dragon dictation along with smaller phrase technology. Any transcriptional errors that result from this process are unintentional.

## 2017-09-08 NOTE — Patient Instructions (Signed)
Good to see you See me again in 6-7 weeks 

## 2017-09-08 NOTE — Assessment & Plan Note (Signed)
Decision today to treat with OMT was based on Physical Exam  After verbal consent patient was treated with HVLA, ME, FPR techniques in cervical, thoracic, lumbar and sacral areas  Patient tolerated the procedure well with improvement in symptoms  Patient given exercises, stretches and lifestyle modifications  See medications in patient instructions if given  Patient will follow up in 6-8 weeks 

## 2017-10-02 MED FILL — ESTRADIOL 0.1 MG/GM CREA: 0.1 | 90 days supply | Qty: 43 | Fill #0

## 2017-10-02 MED FILL — FOLIC ACID 1 MG TABLET: 1 | 90 days supply | Qty: 90 | Fill #0

## 2017-10-14 MED FILL — CYCLOBENZAPRINE 10 MG TAB: 10 | 60 days supply | Qty: 30 | Fill #1

## 2017-10-14 MED FILL — AMLODIPINE BESYLATE 5 MG TA: 5 | 90 days supply | Qty: 90 | Fill #0

## 2017-10-16 MED FILL — GABAPENTIN 400 MG CAPSULE: 400 | 90 days supply | Qty: 270 | Fill #0

## 2017-10-19 ENCOUNTER — Ambulatory Visit (INDEPENDENT_AMBULATORY_CARE_PROVIDER_SITE_OTHER): Payer: PPO | Admitting: *Deleted

## 2017-10-19 DIAGNOSIS — Z23 Encounter for immunization: Secondary | ICD-10-CM

## 2017-10-19 DIAGNOSIS — D1801 Hemangioma of skin and subcutaneous tissue: Secondary | ICD-10-CM | POA: Diagnosis not present

## 2017-10-19 DIAGNOSIS — Z85828 Personal history of other malignant neoplasm of skin: Secondary | ICD-10-CM | POA: Diagnosis not present

## 2017-10-19 DIAGNOSIS — L821 Other seborrheic keratosis: Secondary | ICD-10-CM | POA: Diagnosis not present

## 2017-10-19 DIAGNOSIS — L820 Inflamed seborrheic keratosis: Secondary | ICD-10-CM | POA: Diagnosis not present

## 2017-10-19 DIAGNOSIS — L309 Dermatitis, unspecified: Secondary | ICD-10-CM | POA: Diagnosis not present

## 2017-10-19 DIAGNOSIS — D2261 Melanocytic nevi of right upper limb, including shoulder: Secondary | ICD-10-CM | POA: Diagnosis not present

## 2017-10-19 DIAGNOSIS — L570 Actinic keratosis: Secondary | ICD-10-CM | POA: Diagnosis not present

## 2017-10-19 DIAGNOSIS — Z85820 Personal history of malignant melanoma of skin: Secondary | ICD-10-CM | POA: Diagnosis not present

## 2017-10-19 DIAGNOSIS — C44519 Basal cell carcinoma of skin of other part of trunk: Secondary | ICD-10-CM | POA: Diagnosis not present

## 2017-10-19 DIAGNOSIS — D485 Neoplasm of uncertain behavior of skin: Secondary | ICD-10-CM | POA: Diagnosis not present

## 2017-10-19 NOTE — Progress Notes (Signed)
[name] [place], [place] [address] Phone: [phone] Subjective:     CC: Neck and back pain.  [id number]  Tracey [name] is a 66 y.o. female coming in with complaint of neck and back pain.  Patient has responded very well to osteopathic manipulation and home exercises.  Patient states       Past Medical History:  Diagnosis Date  . Closed hip fracture ([place]) 03/2016   RT HIP  . Hypertension   . Osteopenia   . Osteoporosis    h/o  . Skin cancer    multiple skin cancers   Past Surgical History:  Procedure Laterality Date  . CALDWELL LUC    . CANNULATED HIP PINNING Right 03/21/2016   Performed by Rod Can, MD at [facility] WITH PROPOFOL N/A 03/22/2014   Performed by Arta Silence, MD at [facility]  . NASAL SEPTUM SURGERY     past hx. deviated septum  . TONSILLECTOMY     Social History   Socioeconomic History  . Marital status: Married    Spouse name: [name]  . Number of children: 1  . Years of education: Not on file  . Highest education level: Not on file  Social Needs  . Financial resource strain: Not on file  . Food insecurity - worry: Not on file  . Food insecurity - inability: Not on file  . Transportation needs - medical: Not on file  . Transportation needs - non-medical: Not on file  Occupational History  . Occupation: MED TECH    Employer: [employer]  Tobacco Use  . Smoking status: Never Smoker  . Smokeless tobacco: Never Used  Substance and Sexual Activity  . Alcohol use: Yes    Alcohol/week: 0.0 oz  . Drug use: No  . Sexual activity: Yes    Partners: Male    Comment: married, [place]  Other Topics Concern  . Not on file  Social History Narrative  . Not on file   Allergies  Allergen Reactions  . Cefaclor     REACTION: urticaria (hives)  . Neomycin-Bacitracin Zn-Polymyx     REACTION: rash   Family History  Problem Relation Age of Onset  . Hypertension Mother    . Leukemia Father   . Hypertension Father   . Diabetes Brother   . Hypertension Brother   . Hyperlipidemia Maternal Grandmother   . Cancer Maternal Grandfather   . Heart disease Paternal Grandfather   . Stroke Paternal Grandfather      Past medical history, social, surgical and family history all reviewed in electronic medical record.  No pertanent information unless stated regarding to the chief complaint.   Review of Systems:Review of systems updated and as accurate as of 10/19/17  No headache, visual changes, nausea, vomiting, diarrhea, constipation, dizziness, abdominal pain, skin rash, fevers, chills, night sweats, weight loss, swollen lymph nodes, body aches, joint swelling, muscle aches, chest pain, shortness of breath, mood changes.   Objective  There were no vitals taken for this visit. Systems examined below as of 10/19/17   General: No apparent distress alert and oriented x3 mood and affect normal, dressed appropriately.  HEENT: Pupils equal, extraocular movements intact  Respiratory: Patient's speak in full sentences and does not appear short of breath  Cardiovascular: No lower extremity edema, non tender, no erythema  Skin: Warm dry intact with no signs of infection or rash on extremities or on axial skeleton.  Abdomen: Soft nontender  Neuro: Cranial nerves II through XII are intact, neurovascularly intact in all extremities with 2+ DTRs and 2+ pulses.  Lymph: No lymphadenopathy of posterior or anterior cervical chain or axillae bilaterally.  Gait normal with good balance and coordination.  MSK:  Non tender with full range of motion and good stability and symmetric strength and tone of shoulders, elbows, wrist, hip, knee and ankles bilaterally.  Neck: Inspection unremarkable. No palpable stepoffs. Negative Spurling's maneuver. Full neck range of motion Grip strength and sensation normal in bilateral hands Strength good C4 to T1 distribution No sensory change to C4  to T1 Negative Hoffman sign bilaterally Reflexes normal  Back Exam:  Inspection: Unremarkable  Motion: Flexion 45 deg, Extension 45 deg, Side Bending to 45 deg bilaterally,  Rotation to 45 deg bilaterally  SLR laying: Negative  XSLR laying: Negative  Palpable tenderness: None. FABER: negative. Sensory change: Gross sensation intact to all lumbar and sacral dermatomes.  Reflexes: 2+ at both patellar tendons, 2+ at achilles tendons, Babinski's downgoing.  Strength at foot  Plantar-flexion: 5/5 Dorsi-flexion: 5/5 Eversion: 5/5 Inversion: 5/5  Leg strength  Quad: 5/5 Hamstring: 5/5 Hip flexor: 5/5 Hip abductors: 5/5  Gait unremarkable.  Osteopathic findings C2 flexed rotated and side bent right C4 flexed rotated and side bent right  C6 flexed rotated and side bent left T3 extended rotated and side bent right inhaled third rib T7 extended rotated and side bent left L2 flexed rotated and side bent right Sacrum right on right    Impression and Recommendations:     This case required medical decision making of moderate complexity.      Note: This dictation was prepared with Dragon dictation along with smaller phrase technology. Any transcriptional errors that result from this process are unintentional.

## 2017-10-20 ENCOUNTER — Ambulatory Visit: Payer: PPO | Admitting: Family Medicine

## 2017-10-20 ENCOUNTER — Encounter: Payer: Self-pay | Admitting: Family Medicine

## 2017-10-20 VITALS — BP 114/80 | HR 89 | Ht 66.0 in | Wt 107.0 lb

## 2017-10-20 DIAGNOSIS — M999 Biomechanical lesion, unspecified: Secondary | ICD-10-CM

## 2017-10-20 DIAGNOSIS — M533 Sacrococcygeal disorders, not elsewhere classified: Secondary | ICD-10-CM | POA: Diagnosis not present

## 2017-10-20 NOTE — Assessment & Plan Note (Signed)
Has done very well overall.  Encouraged her to continue to stay active.  We discussed icing regimen and home exercises.  Has been 6 weeks and will continue at 6-8-week intervals.  No change in management.

## 2017-10-20 NOTE — Patient Instructions (Signed)
Always good to see you  Happy [place] day! Keep it up! See me again in 6-8 weeks!

## 2017-10-20 NOTE — Assessment & Plan Note (Signed)
Decision today to treat with OMT was based on Physical Exam  After verbal consent patient was treated with HVLA, ME, FPR techniques in cervical, thoracic, lumbar and sacral areas  Patient tolerated the procedure well with improvement in symptoms  Patient given exercises, stretches and lifestyle modifications  See medications in patient instructions if given  Patient will follow up in 6-8 weeks 

## 2017-10-27 DIAGNOSIS — M810 Age-related osteoporosis without current pathological fracture: Secondary | ICD-10-CM | POA: Diagnosis not present

## 2017-10-27 DIAGNOSIS — M8588 Other specified disorders of bone density and structure, other site: Secondary | ICD-10-CM | POA: Diagnosis not present

## 2017-10-30 DIAGNOSIS — H04123 Dry eye syndrome of bilateral lacrimal glands: Secondary | ICD-10-CM | POA: Diagnosis not present

## 2017-10-30 DIAGNOSIS — H2513 Age-related nuclear cataract, bilateral: Secondary | ICD-10-CM | POA: Diagnosis not present

## 2017-10-30 DIAGNOSIS — H5213 Myopia, bilateral: Secondary | ICD-10-CM | POA: Diagnosis not present

## 2017-12-02 ENCOUNTER — Encounter: Payer: Self-pay | Admitting: Family Medicine

## 2017-12-02 ENCOUNTER — Ambulatory Visit: Payer: PPO | Admitting: Family Medicine

## 2017-12-02 VITALS — BP 150/84 | HR 70 | Ht 66.0 in | Wt 108.0 lb

## 2017-12-02 DIAGNOSIS — M999 Biomechanical lesion, unspecified: Secondary | ICD-10-CM

## 2017-12-02 DIAGNOSIS — M533 Sacrococcygeal disorders, not elsewhere classified: Secondary | ICD-10-CM

## 2017-12-02 NOTE — Progress Notes (Signed)
[name] [place] [place], [place] [address] Phone: [phone] Subjective:     CC: Back and neck pain follow-up  [id number]  Tracey [name] is a 67 y.o. female coming in with complaint of back and neck pain follow-up.  Found to have mild osteoarthritic changes but mostly muscle imbalances.  Has responded fairly well to osteopathic manipulation.  On a fairly high dose of gabapentin at 400 mg at night and has muscle relaxers for breakthrough pain.  She states that she is feeling the same as usual.  Overall not doing too bad.  Will be traveling in 3 weeks for a cruise      Past Medical History:  Diagnosis Date  . Closed hip fracture ([place]) 03/2016   RT HIP  . Hypertension   . Osteopenia   . Osteoporosis    h/o  . Skin cancer    multiple skin cancers   Past Surgical History:  Procedure Laterality Date  . CALDWELL LUC    . COLONOSCOPY WITH PROPOFOL N/A 03/22/2014   Procedure: COLONOSCOPY WITH PROPOFOL;  Surgeon: Arta Silence, MD;  Location: [facility];  Service: Endoscopy;  Laterality: N/A;  . HIP PINNING,CANNULATED Right 03/21/2016   Procedure: CANNULATED HIP PINNING;  Surgeon: Rod Can, MD;  Location: [facility];  Service: Orthopedics;  Laterality: Right;  . NASAL SEPTUM SURGERY     past hx. deviated septum  . TONSILLECTOMY     Social History   Socioeconomic History  . Marital status: Married    Spouse name: [name]  . Number of children: 1  . Years of education: None  . Highest education level: None  Social Needs  . Financial resource strain: None  . Food insecurity - worry: None  . Food insecurity - inability: None  . Transportation needs - medical: None  . Transportation needs - non-medical: None  Occupational History  . Occupation: MED TECH    Employer: [employer]  Tobacco Use  . Smoking status: Never Smoker  . Smokeless tobacco: Never Used  Substance and Sexual Activity  . Alcohol use: Yes    Alcohol/week:  0.0 oz  . Drug use: No  . Sexual activity: Yes    Partners: Male    Comment: married, [place]  Other Topics Concern  . None  Social History Narrative  . None   Allergies  Allergen Reactions  . Cefaclor     REACTION: urticaria (hives)  . Neomycin-Bacitracin Zn-Polymyx     REACTION: rash   Family History  Problem Relation Age of Onset  . Hypertension Mother   . Leukemia Father   . Hypertension Father   . Diabetes Brother   . Hypertension Brother   . Hyperlipidemia Maternal Grandmother   . Cancer Maternal Grandfather   . Heart disease Paternal Grandfather   . Stroke Paternal Grandfather      Past medical history, social, surgical and family history all reviewed in electronic medical record.  No pertanent information unless stated regarding to the chief complaint.   Review of Systems:Review of systems updated and as accurate as of 12/02/17  No headache, visual changes, nausea, vomiting, diarrhea, constipation, dizziness, abdominal pain, skin rash, fevers, chills, night sweats, weight loss, swollen lymph nodes, body aches, joint swelling, chest pain, shortness of breath, mood changes.  Positive muscle aches  Objective  Blood pressure (!) 150/84, pulse 70, height 5\' 6"  (1.676 m), weight 108 lb (49 kg), SpO2 96 %. Systems examined below as of  12/02/17   General: No apparent distress alert and oriented x3 mood and affect normal, dressed appropriately.  HEENT: Pupils equal, extraocular movements intact  Respiratory: Patient's speak in full sentences and does not appear short of breath  Cardiovascular: No lower extremity edema, non tender, no erythema  Skin: Warm dry intact with no signs of infection or rash on extremities or on axial skeleton.  Abdomen: Soft nontender  Neuro: Cranial nerves II through XII are intact, neurovascularly intact in all extremities with 2+ DTRs and 2+ pulses.  Lymph: No lymphadenopathy of posterior or anterior cervical chain or axillae bilaterally.    Gait normal with good balance and coordination.  MSK:  Non tender with full range of motion and good stability and symmetric strength and tone of shoulders, elbows, wrist, hip, knee and ankles bilaterally.  Mild arthritis Back Exam:  Inspection: Mild loss of lordosis Motion: Flexion 45 deg, Extension 25 deg, Side Bending to 35 deg bilaterally,  Rotation to 35 deg bilaterally  SLR laying: Negative  XSLR laying: Negative  Palpable tenderness: Tender to palpation the paraspinal musculature. FABER: positive right . Sensory change: Gross sensation intact to all lumbar and sacral dermatomes.  Reflexes: 2+ at both patellar tendons, 2+ at achilles tendons, Babinski's downgoing.  Strength at foot  Plantar-flexion: 5/5 Dorsi-flexion: 5/5 Eversion: 5/5 Inversion: 5/5  Leg strength  Quad: 5/5 Hamstring: 5/5 Hip flexor: 5/5 Hip abductors: 5/5  Gait unremarkable.      Impression and Recommendations:     This case required medical decision making of moderate complexity.      Note: This dictation was prepared with Dragon dictation along with smaller phrase technology. Any transcriptional errors that result from this process are unintentional.

## 2017-12-02 NOTE — Assessment & Plan Note (Signed)
Decision today to treat with OMT was based on Physical Exam  After verbal consent patient was treated with HVLA, ME, FPR techniques in cervical, thoracic, lumbar and sacral areas  Patient tolerated the procedure well with improvement in symptoms  Patient given exercises, stretches and lifestyle modifications  See medications in patient instructions if given  Patient will follow up in 4 weeks 

## 2017-12-02 NOTE — Patient Instructions (Signed)
Good to see you! See you again in 4 weeks 

## 2017-12-02 NOTE — Assessment & Plan Note (Addendum)
Discussed with patient about icing regimen and home exercise. Patient is to do home exercise, icing regimen, we discussed which activities of doing which wants to avoid.

## 2018-01-06 MED FILL — FOLIC ACID 1 MG TABLET: 1 | 90 days supply | Qty: 90 | Fill #1

## 2018-01-06 MED FILL — AMLODIPINE BESYLATE 5 MG TA: 5 | 90 days supply | Qty: 90 | Fill #1

## 2018-01-13 NOTE — Progress Notes (Signed)
[name] [place], [place] [address] Phone: [phone] Subjective:      CC: Back pain follow-up  [id number]  Tracey [name] is a 67 y.o. female coming in with complaint of back pain.  Patient has had degenerative disc disease at multiple levels.  Patient has responded fairly well to osteopathic manipulation.  Patient has had some mild exacerbation.  Was traveling a lot more frequently.  Had some more back spasms.  Denies any radiation down the legs or any numbness or tingling.  Rates the severity of pain is 7 out of 10        Past Medical History:  Diagnosis Date  . Closed hip fracture ([place] [facility]) 03/2016   RT HIP  . Hypertension   . Osteopenia   . Osteoporosis    h/o  . Skin cancer    multiple skin cancers   Past Surgical History:  Procedure Laterality Date  . CALDWELL LUC    . COLONOSCOPY WITH PROPOFOL N/A 03/22/2014   Procedure: COLONOSCOPY WITH PROPOFOL;  Surgeon: Arta Silence, MD;  [facility];  Service: Endoscopy;  Laterality: N/A;  . HIP PINNING,CANNULATED Right 03/21/2016   Procedure: CANNULATED HIP PINNING;  Surgeon: Rod Can, MD;  Location: [facility];  Service: Orthopedics;  Laterality: Right;  . NASAL SEPTUM SURGERY     past hx. deviated septum  . TONSILLECTOMY     Social History   Socioeconomic History  . Marital status: Married    Spouse name: [name]  . Number of children: 1  . Years of education: None  . Highest education level: None  Social Needs  . Financial resource strain: None  . Food insecurity - worry: None  . Food insecurity - inability: None  . Transportation needs - medical: None  . Transportation needs - non-medical: None  Occupational History  . Occupation: MED TECH    Employer: [employer]  Tobacco Use  . Smoking status: Never Smoker  . Smokeless tobacco: Never Used  Substance and Sexual Activity  . Alcohol use: Yes    Alcohol/week: 0.0 oz  . Drug use: No  . Sexual  activity: Yes    Partners: Male    Comment: married, [place]  Other Topics Concern  . None  Social History Narrative  . None   Allergies  Allergen Reactions  . Cefaclor     REACTION: urticaria (hives)  . Neomycin-Bacitracin Zn-Polymyx     REACTION: rash   Family History  Problem Relation Age of Onset  . Hypertension Mother   . Leukemia Father   . Hypertension Father   . Diabetes Brother   . Hypertension Brother   . Hyperlipidemia Maternal Grandmother   . Cancer Maternal Grandfather   . Heart disease Paternal Grandfather   . Stroke Paternal Grandfather      Past medical history, social, surgical and family history all reviewed in electronic medical record.  No pertanent information unless stated regarding to the chief complaint.   Review of Systems:Review of systems updated and as accurate as of 01/14/18  No headache, visual changes, nausea, vomiting, diarrhea, constipation, dizziness, abdominal pain, skin rash, fevers, chills, night sweats, weight loss, swollen lymph nodes, body aches, joint swelling, muscle aches, chest pain, shortness of breath, mood changes.  Positive muscle aches  Objective  Blood pressure (!) 148/74, pulse 79, height 5\' 6"  (1.676 m), weight 109 lb (49.4 kg), SpO2 97 %. Systems examined below as of 01/14/18   General:  No apparent distress alert and oriented x3 mood and affect normal, dressed appropriately.  HEENT: Pupils equal, extraocular movements intact  Respiratory: Patient's speak in full sentences and does not appear short of breath  Cardiovascular: No lower extremity edema, non tender, no erythema  Skin: Warm dry intact with no signs of infection or rash on extremities or on axial skeleton.  Abdomen: Soft nontender  Neuro: Cranial nerves II through XII are intact, neurovascularly intact in all extremities with 2+ DTRs and 2+ pulses.  Lymph: No lymphadenopathy of posterior or anterior cervical chain or axillae bilaterally.  Gait normal with  good balance and coordination.  MSK:  Non tender with full range of motion and good stability and symmetric strength and tone of shoulders, elbows, wrist, hip, knee and ankles bilaterally.  Back exam shows some degenerative scoliosis noted.  Mild increase in kyphosis of the thoracic spine.  Mild limitation of 5-10 degrees in all planes.  Positive Faber on the right side. Osteopathic findings C2 flexed rotated and side bent right C4 flexed rotated and side bent left C6 flexed rotated and side bent right  T7 extended rotated and side bent left L3 flexed rotated and side bent right Sacrum right on right     Impression and Recommendations:     This case required medical decision making of moderate complexity.      Note: This dictation was prepared with Dragon dictation along with smaller phrase technology. Any transcriptional errors that result from this process are unintentional.

## 2018-01-14 ENCOUNTER — Ambulatory Visit: Payer: PPO | Admitting: Family Medicine

## 2018-01-14 ENCOUNTER — Encounter: Payer: Self-pay | Admitting: Family Medicine

## 2018-01-14 VITALS — BP 148/74 | HR 79 | Ht 66.0 in | Wt 109.0 lb

## 2018-01-14 DIAGNOSIS — M533 Sacrococcygeal disorders, not elsewhere classified: Secondary | ICD-10-CM

## 2018-01-14 DIAGNOSIS — M999 Biomechanical lesion, unspecified: Secondary | ICD-10-CM | POA: Diagnosis not present

## 2018-01-14 NOTE — Assessment & Plan Note (Signed)
Decision today to treat with OMT was based on Physical Exam  After verbal consent patient was treated with HVLA, ME, FPR techniques in cervical, thoracic, lumbar and sacral areas  Patient tolerated the procedure well with improvement in symptoms  Patient given exercises, stretches and lifestyle modifications  See medications in patient instructions if given  Patient will follow up in 8 weeks 

## 2018-01-14 NOTE — Assessment & Plan Note (Signed)
Patient still has some sacroiliac dysfunction.  Encouraged her to try to do hip abductors.  Patient encouraged to do home exercise on a regular basis.  See patient in 8 week intervals.

## 2018-01-14 NOTE — Patient Instructions (Signed)
Good to see you  Overall you are doing great  I like the gym  Focus on legs and look at that list  I think you will do great  See me again in 4-5 weeks

## 2018-01-19 DIAGNOSIS — M810 Age-related osteoporosis without current pathological fracture: Secondary | ICD-10-CM | POA: Diagnosis not present

## 2018-01-19 DIAGNOSIS — R636 Underweight: Secondary | ICD-10-CM | POA: Diagnosis not present

## 2018-01-19 DIAGNOSIS — Z8781 Personal history of (healed) traumatic fracture: Secondary | ICD-10-CM | POA: Diagnosis not present

## 2018-01-19 MED FILL — ESTRADIOL 0.1 MG/GM CREA: 0.1 | 90 days supply | Qty: 43 | Fill #1

## 2018-01-19 MED FILL — MOXIFLOXACIN 0.5% EYE DROPS: 0.5 | 8 days supply | Qty: 3 | Fill #0

## 2018-01-19 MED FILL — DUREZOL 0.05% EYE DROPS: 0.05 | 25 days supply | Qty: 5 | Fill #0

## 2018-02-01 ENCOUNTER — Other Ambulatory Visit (HOSPITAL_COMMUNITY): Payer: Self-pay | Admitting: *Deleted

## 2018-02-02 ENCOUNTER — Ambulatory Visit (HOSPITAL_COMMUNITY)
Admission: RE | Admit: 2018-02-02 | Discharge: 2018-02-02 | Disposition: A | Discharge status: Home / Self Care | Payer: PPO | Source: Ambulatory Visit | Attending: Internal Medicine | Admitting: Internal Medicine

## 2018-02-02 DIAGNOSIS — M810 Age-related osteoporosis without current pathological fracture: Secondary | ICD-10-CM | POA: Insufficient documentation

## 2018-02-02 MED ORDER — ZOLEDRONIC ACID 5 MG/100ML IV SOLN
INTRAVENOUS | Status: AC
  Filled 2018-02-02: qty 100

## 2018-02-02 MED ORDER — ZOLEDRONIC ACID 5 MG/100ML IV SOLN
5.0000 mg | Freq: Once | INTRAVENOUS | Status: AC
Start: 2018-02-02 — End: 2018-02-02
  Administered 2018-02-02: 5 mg via INTRAVENOUS

## 2018-02-04 DIAGNOSIS — H25811 Combined forms of age-related cataract, right eye: Secondary | ICD-10-CM | POA: Diagnosis not present

## 2018-02-04 DIAGNOSIS — H2511 Age-related nuclear cataract, right eye: Secondary | ICD-10-CM | POA: Diagnosis not present

## 2018-02-05 MED FILL — MOXIFLOXACIN 0.5% EYE DROPS: 0.5 | 8 days supply | Qty: 3 | Fill #0

## 2018-02-07 MED FILL — DUREZOL 0.05% EYE DROPS: 0.05 | 25 days supply | Qty: 5 | Fill #0

## 2018-02-11 DIAGNOSIS — H25812 Combined forms of age-related cataract, left eye: Secondary | ICD-10-CM | POA: Diagnosis not present

## 2018-02-11 DIAGNOSIS — H2512 Age-related nuclear cataract, left eye: Secondary | ICD-10-CM | POA: Diagnosis not present

## 2018-02-15 ENCOUNTER — Ambulatory Visit: Payer: PPO | Admitting: Family Medicine

## 2018-02-17 NOTE — Progress Notes (Signed)
[name] [place], [place] [address] Phone: [phone] Subjective:    I'm seeing this patient by the request  of:    CC: Back pain and back pain follow-up  [id number]  Tracey [name] is a 67 y.o. female coming in with complaint of back pain and neck pain follow-up.  Patient has had pain for quite some time.  Describes it as a dull, throbbing aching sensation. Discussed icing regimen and home exercises.  Has been doing them intermittently.  Patient though has not been able to go to the gym yet and is trying some different words out.    Past Medical History:  Diagnosis Date  . Closed hip fracture ([facility]) 03/2016   RT HIP  . Hypertension   . Osteopenia   . Osteoporosis    h/o  . Skin cancer    multiple skin cancers   Past Surgical History:  Procedure Laterality Date  . CALDWELL LUC    . COLONOSCOPY WITH PROPOFOL N/A 03/22/2014   Procedure: COLONOSCOPY WITH PROPOFOL;  Surgeon: Arta Silence, MD;  [facility];  Service: Endoscopy;  Laterality: N/A;  . HIP PINNING,CANNULATED Right 03/21/2016   Procedure: CANNULATED HIP PINNING;  Surgeon: Rod Can, MD;  Location: [facility];  Service: Orthopedics;  Laterality: Right;  . NASAL SEPTUM SURGERY     past hx. deviated septum  . TONSILLECTOMY     Social History   Socioeconomic History  . Marital status: Married    Spouse name: [name]  . Number of children: 1  . Years of education: Not on file  . Highest education level: Not on file  Occupational History  . Occupation: MED TECH    Employer: [employer]  Social Needs  . Financial resource strain: Not on file  . Food insecurity:    Worry: Not on file    Inability: Not on file  . Transportation needs:    Medical: Not on file    Non-medical: Not on file  Tobacco Use  . Smoking status: Never Smoker  . Smokeless tobacco: Never Used  Substance and Sexual Activity  . Alcohol use: Yes    Alcohol/week: 0.0 oz  .  Drug use: No  . Sexual activity: Yes    Partners: Male    Comment: married, [place]  Lifestyle  . Physical activity:    Days per week: Not on file    Minutes per session: Not on file  . Stress: Not on file  Relationships  . Social connections:    Talks on phone: Not on file    Gets together: Not on file    Attends religious service: Not on file    Active member of club or organization: Not on file    Attends meetings of clubs or organizations: Not on file    Relationship status: Not on file  Other Topics Concern  . Not on file  Social History Narrative  . Not on file   Allergies  Allergen Reactions  . Cefaclor     REACTION: urticaria (hives)  . Neomycin-Bacitracin Zn-Polymyx     REACTION: rash   Family History  Problem Relation Age of Onset  . Hypertension Mother   . Leukemia Father   . Hypertension Father   . Diabetes Brother   . Hypertension Brother   . Hyperlipidemia Maternal Grandmother   . Cancer Maternal Grandfather   . Heart disease Paternal Grandfather   . Stroke Paternal Grandfather  Past medical history, social, surgical and family history all reviewed in electronic medical record.  No pertanent information unless stated regarding to the chief complaint.   Review of Systems:Review of systems updated and as accurate as of 02/18/18  No headache, visual changes, nausea, vomiting, diarrhea, constipation, dizziness, abdominal pain, skin rash, fevers, chills, night sweats, weight loss, swollen lymph nodes, body aches, joint swelling, muscle aches, chest pain, shortness of breath, mood changes.   Objective  Blood pressure (!) 150/80, pulse 88, height 5\' 6"  (1.676 m), weight 104 lb (47.2 kg), SpO2 98 %. Systems examined below as of 02/18/18   General: No apparent distress alert and oriented x3 mood and affect normal, dressed appropriately.  Underweight HEENT: Pupils equal, extraocular movements intact  Respiratory: Patient's speak in full sentences and  does not appear short of breath  Cardiovascular: No lower extremity edema, non tender, no erythema  Skin: Warm dry intact with no signs of infection or rash on extremities or on axial skeleton.  Abdomen: Soft nontender  Neuro: Cranial nerves II through XII are intact, neurovascularly intact in all extremities with 2+ DTRs and 2+ pulses.  Lymph: No lymphadenopathy of posterior or anterior cervical chain or axillae bilaterally.  Gait normal with good balance and coordination.  MSK:  Non tender with full range of motion and good stability and symmetric strength and tone of shoulders, elbows, wrist, hip, knee and ankles bilaterally.  Arthritic changes of multiple joints.  Neck: Inspection mild loss of lordosis. No palpable stepoffs. Negative Spurling's maneuver. Full neck range of motion Grip strength and sensation normal in bilateral hands Strength good C4 to T1 distribution No sensory change to C4 to T1 Negative Hoffman sign bilaterally Reflexes normal Tightness in the right trapezius  Osteopathic findings C2 flexed rotated and side bent right C4 flexed rotated and side bent left C7 flexed rotated and side bent left T3 extended rotated and side bent right inhaled third rib T9 extended rotated and side bent left L2 flexed rotated and side bent right Sacrum right on right Wrist manipulation patient did have an audible pop noted on the left side of the chest wall.  Concern for potential rib fracture    Impression and Recommendations:     This case required medical decision making of moderate complexity.      Note: This dictation was prepared with Dragon dictation along with smaller phrase technology. Any transcriptional errors that result from this process are unintentional.

## 2018-02-18 ENCOUNTER — Encounter: Payer: Self-pay | Admitting: Family Medicine

## 2018-02-18 ENCOUNTER — Ambulatory Visit: Payer: PPO | Admitting: Family Medicine

## 2018-02-18 VITALS — BP 150/80 | HR 88 | Ht 66.0 in | Wt 104.0 lb

## 2018-02-18 DIAGNOSIS — M999 Biomechanical lesion, unspecified: Secondary | ICD-10-CM | POA: Diagnosis not present

## 2018-02-18 DIAGNOSIS — M62838 Other muscle spasm: Secondary | ICD-10-CM

## 2018-02-18 NOTE — Assessment & Plan Note (Signed)
Decision today to treat with OMT was based on Physical Exam  After verbal consent patient was treated with HVLA, ME, FPR techniques in cervical, thoracic, lumbar and sacral areas  Patient tolerated the procedure well with improvement in symptoms  Patient given exercises, stretches and lifestyle modifications  See medications in patient instructions if given  Patient will follow up in 4 weeks 

## 2018-02-18 NOTE — Patient Instructions (Signed)
Good to see you  Ice 20 minutes 2 times daily. Usually after activity and before bed. pennsaid pinkie amount topically 2 times daily as needed.  No chest for at least 1 week (sorry) Continue the vitamin D  May need tramadol at night See me again in 3 weeks

## 2018-02-18 NOTE — Assessment & Plan Note (Signed)
Continues to have some tightness of the neck.  Patient continues to try to do with cycling.  Going to go to the gym.  We discussed protein supplementation.  Likely will need some weight gain.  Feels like muscle mass would be beneficial for a lot of her discomfort and pain.  Patient will follow up with me again in 4 weeks.

## 2018-02-23 ENCOUNTER — Encounter: Payer: Self-pay | Admitting: Family Medicine

## 2018-02-24 ENCOUNTER — Ambulatory Visit (INDEPENDENT_AMBULATORY_CARE_PROVIDER_SITE_OTHER)
Admission: RE | Admit: 2018-02-24 | Discharge: 2018-02-24 | Disposition: A | Discharge status: Home / Self Care | Payer: PPO | Source: Ambulatory Visit | Attending: Family Medicine | Admitting: Family Medicine

## 2018-02-24 ENCOUNTER — Other Ambulatory Visit: Payer: Self-pay

## 2018-02-24 DIAGNOSIS — R0789 Other chest pain: Secondary | ICD-10-CM

## 2018-02-24 DIAGNOSIS — R0781 Pleurodynia: Secondary | ICD-10-CM | POA: Diagnosis not present

## 2018-03-15 NOTE — Progress Notes (Signed)
[name] [name], [name] [address] Phone: [phone] Subjective:      CC: Back pain follow-up  [id number]  Tracey [name] is a 67 y.o. female coming in with complaint of back pain. She is here today for OMT. No changes since last visit.  Patient did have rib pain afterwards.  We did get x-rays that did not show any bony abnormality.  No fracture of the rib acutely.  Patient is doing relatively well.  States that had 4 days of pain after last manipulation.       Past Medical History:  Diagnosis Date  . Closed hip fracture ([name]) 03/2016   RT HIP  . Hypertension   . Osteopenia   . Osteoporosis    h/o  . Skin cancer    multiple skin cancers   Past Surgical History:  Procedure Laterality Date  . CALDWELL LUC    . COLONOSCOPY WITH PROPOFOL N/A 03/22/2014   Procedure: COLONOSCOPY WITH PROPOFOL;  Surgeon: Arta Silence, MD;  [facility];  Service: Endoscopy;  Laterality: N/A;  . HIP PINNING,CANNULATED Right 03/21/2016   Procedure: CANNULATED HIP PINNING;  Surgeon: Rod Can, MD;  Location: [facility];  Service: Orthopedics;  Laterality: Right;  . NASAL SEPTUM SURGERY     past hx. deviated septum  . TONSILLECTOMY     Social History   Socioeconomic History  . Marital status: Married    Spouse name: Tracey [name]  . Number of children: 1  . Years of education: Not on file  . Highest education level: Not on file  Occupational History  . Occupation: MED TECH    Employer: [employer]  Social Needs  . Financial resource strain: Not on file  . Food insecurity:    Worry: Not on file    Inability: Not on file  . Transportation needs:    Medical: Not on file    Non-medical: Not on file  Tobacco Use  . Smoking status: Never Smoker  . Smokeless tobacco: Never Used  Substance and Sexual Activity  . Alcohol use: Yes    Alcohol/week: 0.0 oz  . Drug use: No  . Sexual activity: Yes    Partners: Male    Comment:  married, [place]  Lifestyle  . Physical activity:    Days per week: Not on file    Minutes per session: Not on file  . Stress: Not on file  Relationships  . Social connections:    Talks on phone: Not on file    Gets together: Not on file    Attends religious service: Not on file    Active member of club or organization: Not on file    Attends meetings of clubs or organizations: Not on file    Relationship status: Not on file  Other Topics Concern  . Not on file  Social History Narrative  . Not on file   Allergies  Allergen Reactions  . Cefaclor     REACTION: urticaria (hives)  . Neomycin-Bacitracin Zn-Polymyx     REACTION: rash   Family History  Problem Relation Age of Onset  . Hypertension Mother   . Leukemia Father   . Hypertension Father   . Diabetes Brother   . Hypertension Brother   . Hyperlipidemia Maternal Grandmother   . Cancer Maternal Grandfather   . Heart disease Paternal Grandfather   . Stroke Paternal Grandfather      Past medical history, social, surgical and family  history all reviewed in electronic medical record.  No pertanent information unless stated regarding to the chief complaint.   Review of Systems:Review of systems updated and as accurate as of 03/16/18  No headache, visual changes, nausea, vomiting, diarrhea, constipation, dizziness, abdominal pain, skin rash, fevers, chills, night sweats, weight loss, swollen lymph nodes, body aches, joint swelling, chest pain, shortness of breath, mood changes.  Positive muscle aches  Objective  Blood pressure 132/78, pulse 83, height 5\' 6"  (1.676 m), weight 108 lb (49 kg), SpO2 98 %. Systems examined below as of 03/16/18   General: No apparent distress alert and oriented x3 mood and affect normal, dressed appropriately.  HEENT: Pupils equal, extraocular movements intact  Respiratory: Patient's speak in full sentences and does not appear short of breath  Cardiovascular: No lower extremity edema, non  tender, no erythema  Skin: Warm dry intact with no signs of infection or rash on extremities or on axial skeleton.  Abdomen: Soft nontender  Neuro: Cranial nerves II through XII are intact, neurovascularly intact in all extremities with 2+ DTRs and 2+ pulses.  Lymph: No lymphadenopathy of posterior or anterior cervical chain or axillae bilaterally.  Gait normal with good balance and coordination.  MSK:  tender with mild range of motion and good stability and symmetric strength and tone of shoulders, elbows, wrist, hip, knee and ankles bilaterally.  Patient back exam shows some mild increase in kyphosis.  This is mostly of the thoracic spine.  Some mild loss of lordosis with some mild degenerative scoliosis. Positive Corky Sox  Osteopathic findings C2 flexed rotated and side bent right C4 flexed rotated and side bent left C7 flexed rotated and side bent left T3 extended rotated and side bent right inhaled third rib T6 extended rotated and side bent left L3 flexed rotated and side bent right Sacrum right on right    Impression and Recommendations:     This case required medical decision making of moderate complexity.      Note: This dictation was prepared with Dragon dictation along with smaller phrase technology. Any transcriptional errors that result from this process are unintentional.

## 2018-03-16 ENCOUNTER — Ambulatory Visit: Payer: PPO | Admitting: Family Medicine

## 2018-03-16 ENCOUNTER — Encounter: Payer: Self-pay | Admitting: Family Medicine

## 2018-03-16 VITALS — BP 132/78 | HR 83 | Ht 66.0 in | Wt 108.0 lb

## 2018-03-16 DIAGNOSIS — M533 Sacrococcygeal disorders, not elsewhere classified: Secondary | ICD-10-CM | POA: Diagnosis not present

## 2018-03-16 DIAGNOSIS — M999 Biomechanical lesion, unspecified: Secondary | ICD-10-CM

## 2018-03-16 NOTE — Assessment & Plan Note (Signed)
Decision today to treat with OMT was based on Physical Exam  After verbal consent patient was treated with HVLA, ME, FPR techniques in cervical, thoracic, lumbar and sacral areas  Patient tolerated the procedure well with improvement in symptoms  Patient given exercises, stretches and lifestyle modifications  See medications in patient instructions if given  Patient will follow up in 4-6 weeks 

## 2018-03-16 NOTE — Patient Instructions (Signed)
Good to see you  Ice can help  The rib will be fine.  See me again in 4 weeks

## 2018-03-16 NOTE — Assessment & Plan Note (Signed)
Continues to have back pain and other muscle pains.  We discussed with patient about the calcium supplementation with patient's x-rays findings.  We discussed icing regimen and home exercise.  Tried more low velocity type exercises as well as manipulation today.  Follow-up again in 4-6 weeks

## 2018-03-31 ENCOUNTER — Ambulatory Visit: Payer: PPO | Admitting: Family Medicine

## 2018-04-06 DIAGNOSIS — Z85820 Personal history of malignant melanoma of skin: Secondary | ICD-10-CM | POA: Diagnosis not present

## 2018-04-06 DIAGNOSIS — C44712 Basal cell carcinoma of skin of right lower limb, including hip: Secondary | ICD-10-CM | POA: Diagnosis not present

## 2018-04-06 DIAGNOSIS — Z85828 Personal history of other malignant neoplasm of skin: Secondary | ICD-10-CM | POA: Diagnosis not present

## 2018-04-06 DIAGNOSIS — L821 Other seborrheic keratosis: Secondary | ICD-10-CM | POA: Diagnosis not present

## 2018-04-06 DIAGNOSIS — D225 Melanocytic nevi of trunk: Secondary | ICD-10-CM | POA: Diagnosis not present

## 2018-04-06 DIAGNOSIS — L820 Inflamed seborrheic keratosis: Secondary | ICD-10-CM | POA: Diagnosis not present

## 2018-04-06 DIAGNOSIS — D2271 Melanocytic nevi of right lower limb, including hip: Secondary | ICD-10-CM | POA: Diagnosis not present

## 2018-04-06 DIAGNOSIS — D2261 Melanocytic nevi of right upper limb, including shoulder: Secondary | ICD-10-CM | POA: Diagnosis not present

## 2018-04-06 DIAGNOSIS — D1801 Hemangioma of skin and subcutaneous tissue: Secondary | ICD-10-CM | POA: Diagnosis not present

## 2018-04-06 DIAGNOSIS — D2272 Melanocytic nevi of left lower limb, including hip: Secondary | ICD-10-CM | POA: Diagnosis not present

## 2018-04-09 MED FILL — CYCLOBENZAPRINE 10 MG TAB: 10 | 60 days supply | Qty: 30 | Fill #2

## 2018-04-09 MED FILL — FOLIC ACID 1 MG TABS: 1 | 90 days supply | Qty: 90 | Fill #2

## 2018-04-09 MED FILL — AMLODIPINE BESYLATE 5 MG TA: 5 | 90 days supply | Qty: 90 | Fill #2

## 2018-04-13 ENCOUNTER — Ambulatory Visit: Payer: PPO | Admitting: Family Medicine

## 2018-04-20 ENCOUNTER — Ambulatory Visit: Payer: PPO | Admitting: Family Medicine

## 2018-04-21 ENCOUNTER — Ambulatory Visit: Payer: PPO | Admitting: Family Medicine

## 2018-04-21 ENCOUNTER — Encounter: Payer: Self-pay | Admitting: Family Medicine

## 2018-04-21 VITALS — BP 138/90 | HR 79 | Ht 66.0 in | Wt 107.0 lb

## 2018-04-21 DIAGNOSIS — M999 Biomechanical lesion, unspecified: Secondary | ICD-10-CM | POA: Diagnosis not present

## 2018-04-21 DIAGNOSIS — M533 Sacrococcygeal disorders, not elsewhere classified: Secondary | ICD-10-CM | POA: Diagnosis not present

## 2018-04-21 NOTE — Patient Instructions (Signed)
Good to see you  I am sorry you are hurting but I think you did good.  Ice in 6 hours  Keep up everything else See me again in 2-3 weeks

## 2018-04-21 NOTE — Assessment & Plan Note (Signed)
Patient does have degenerative disc disease patient is to monitor for any worsening pain.  Has gabapentin for any breakthrough pain.  Has not been taking it regularly.  Discussed icing regimen and home exercise.  Responds well to manipulation.  Follow-up again 4 to 6 weeks

## 2018-04-21 NOTE — Progress Notes (Signed)
[name] [place], [place] [address] Phone: [phone] Subjective:    I'm seeing this patient by the request  of:    CC: Back pain follow-up  TIR:[id number]  Tracey [name] is a 67 y.o. female coming in with complaint of back pain.  Has been seen multiple times.  History of osteoporosis.  Patient has been a little more active and feels that that has exacerbated some of the pain.  Rates the severity pain is 8 out of 10.     Past Medical History:  Diagnosis Date  . Closed hip fracture ([place]) 03/2016   RT HIP  . Hypertension   . Osteopenia   . Osteoporosis    h/o  . Skin cancer    multiple skin cancers   Past Surgical History:  Procedure Laterality Date  . CALDWELL LUC    . COLONOSCOPY WITH PROPOFOL N/A 03/22/2014   Procedure: COLONOSCOPY WITH PROPOFOL;  Surgeon: Arta Silence, MD;  Location: [facility];  Service: Endoscopy;  Laterality: N/A;  . HIP PINNING,CANNULATED Right 03/21/2016   Procedure: CANNULATED HIP PINNING;  Surgeon: Rod Can, MD;  Location: [facility];  Service: Orthopedics;  Laterality: Right;  . NASAL SEPTUM SURGERY     past hx. deviated septum  . TONSILLECTOMY     Social History   Socioeconomic History  . Marital status: Married    Spouse name: [name]  . Number of children: 1  . Years of education: Not on file  . Highest education level: Not on file  Occupational History  . Occupation: MED TECH    Employer: [employer]  Social Needs  . Financial resource strain: Not on file  . Food insecurity:    Worry: Not on file    Inability: Not on file  . Transportation needs:    Medical: Not on file    Non-medical: Not on file  Tobacco Use  . Smoking status: Never Smoker  . Smokeless tobacco: Never Used  Substance and Sexual Activity  . Alcohol use: Yes    Alcohol/week: 0.0 oz  . Drug use: No  . Sexual activity: Yes    Partners: Male    Comment: married, [place]  Lifestyle  . Physical  activity:    Days per week: Not on file    Minutes per session: Not on file  . Stress: Not on file  Relationships  . Social connections:    Talks on phone: Not on file    Gets together: Not on file    Attends religious service: Not on file    Active member of club or organization: Not on file    Attends meetings of clubs or organizations: Not on file    Relationship status: Not on file  Other Topics Concern  . Not on file  Social History Narrative  . Not on file   Allergies  Allergen Reactions  . Cefaclor     REACTION: urticaria (hives)  . Neomycin-Bacitracin Zn-Polymyx     REACTION: rash   Family History  Problem Relation Age of Onset  . Hypertension Mother   . Leukemia Father   . Hypertension Father   . Diabetes Brother   . Hypertension Brother   . Hyperlipidemia Maternal Grandmother   . Cancer Maternal Grandfather   . Heart disease Paternal Grandfather   . Stroke Paternal Grandfather      Past medical history, social, surgical and family history all reviewed in electronic medical record.  No pertanent information unless stated regarding to the chief complaint.   Review of Systems:Review of systems updated and as accurate as of 04/21/18  No headache, visual changes, nausea, vomiting, diarrhea, constipation, dizziness, abdominal pain, skin rash, fevers, chills, night sweats, weight loss, swollen lymph nodes, body aches, joint swelling, muscle aches, chest pain, shortness of breath, mood changes.   Objective  There were no vitals taken for this visit. Systems examined below as of 04/21/18   General: No apparent distress alert and oriented x3 mood and affect normal, dressed appropriately.  HEENT: Pupils equal, extraocular movements intact  Respiratory: Patient's speak in full sentences and does not appear short of breath  Cardiovascular: No lower extremity edema, non tender, no erythema  Skin: Warm dry intact with no signs of infection or rash on extremities or on  axial skeleton.  Abdomen: Soft nontender  Neuro: Cranial nerves II through XII are intact, neurovascularly intact in all extremities with 2+ DTRs and 2+ pulses.  Lymph: No lymphadenopathy of posterior or anterior cervical chain or axillae bilaterally.  Gait normal with good balance and coordination.  MSK:  Non tender with full range of motion and good stability and symmetric strength and tone of shoulders, elbows, wrist, hip, knee and ankles bilaterally.  Arthritic changes of multiple joints  Patient back exam shows a significant tightness of the lumbar spine with loss of lordosis.  Mild degenerative scoliosis noted.  Patient does have tightness and lack of 10 degrees in all planes.  Tenderness to palpation diffusely in the paraspinal musculature lumbar spine bilaterally right greater than left.  Positive Faber on the right negative straight leg test neurovascularly intact distally  Osteopathic findings C2 flexed rotated and side bent right T3 extended rotated and side bent right inhaled third rib T6 extended rotated and side bent left L2 flexed rotated and side bent right Sacrum right on right     Impression and Recommendations:     This case required medical decision making of moderate complexity.      Note: This dictation was prepared with Dragon dictation along with smaller phrase technology. Any transcriptional errors that result from this process are unintentional.

## 2018-04-21 NOTE — Assessment & Plan Note (Signed)
Decision today to treat with OMT was based on Physical Exam  After verbal consent patient was treated with HVLA, ME, FPR techniques in cervical, thoracic, lumbar and sacral areas  Patient tolerated the procedure well with improvement in symptoms  Patient given exercises, stretches and lifestyle modifications  See medications in patient instructions if given  Patient will follow up in 3-4 weeks  

## 2018-05-04 NOTE — Progress Notes (Signed)
[name] [place] [place], [place] [address] Phone: [phone] Subjective:      CC: Back and neck pain  OHY:[id number]  Tracey [name] is a 67 y.o. female coming in with complaint of back and neck pain. She has been feeling much better than last visit.  Patient states that she is still having some mild tightness but nothing severe.     Past Medical History:  Diagnosis Date  . Closed hip fracture ([facility]) 03/2016   RT HIP  . Hypertension   . Osteopenia   . Osteoporosis    h/o  . Skin cancer    multiple skin cancers   Past Surgical History:  Procedure Laterality Date  . CALDWELL LUC    . COLONOSCOPY WITH PROPOFOL N/A 03/22/2014   Procedure: COLONOSCOPY WITH PROPOFOL;  Surgeon: Arta Silence, MD;  [facility];  Service: Endoscopy;  Laterality: N/A;  . HIP PINNING,CANNULATED Right 03/21/2016   Procedure: CANNULATED HIP PINNING;  Surgeon: Rod Can, MD;  Location: [facility];  Service: Orthopedics;  Laterality: Right;  . NASAL SEPTUM SURGERY     past hx. deviated septum  . TONSILLECTOMY     Social History   Socioeconomic History  . Marital status: Married    Spouse name: [name]  . Number of children: 1  . Years of education: Not on file  . Highest education level: Not on file  Occupational History  . Occupation: MED TECH    Employer: [employer]  Social Needs  . Financial resource strain: Not on file  . Food insecurity:    Worry: Not on file    Inability: Not on file  . Transportation needs:    Medical: Not on file    Non-medical: Not on file  Tobacco Use  . Smoking status: Never Smoker  . Smokeless tobacco: Never Used  Substance and Sexual Activity  . Alcohol use: Yes    Alcohol/week: 0.0 oz  . Drug use: No  . Sexual activity: Yes    Partners: Male    Comment: married, [place]  Lifestyle  . Physical activity:    Days per week: Not on file    Minutes per session: Not on file  . Stress: Not on file    Relationships  . Social connections:    Talks on phone: Not on file    Gets together: Not on file    Attends religious service: Not on file    Active member of club or organization: Not on file    Attends meetings of clubs or organizations: Not on file    Relationship status: Not on file  Other Topics Concern  . Not on file  Social History Narrative  . Not on file   Allergies  Allergen Reactions  . Cefaclor     REACTION: urticaria (hives)  . Neomycin-Bacitracin Zn-Polymyx     REACTION: rash   Family History  Problem Relation Age of Onset  . Hypertension Mother   . Leukemia Father   . Hypertension Father   . Diabetes Brother   . Hypertension Brother   . Hyperlipidemia Maternal Grandmother   . Cancer Maternal Grandfather   . Heart disease Paternal Grandfather   . Stroke Paternal Grandfather      Past medical history, social, surgical and family history all reviewed in electronic medical record.  No pertanent information unless stated regarding to the chief complaint.   Review of Systems:Review of systems updated and as accurate  as of 05/06/18  No headache, visual changes, nausea, vomiting, diarrhea, constipation, dizziness, abdominal pain, skin rash, fevers, chills, night sweats, weight loss, swollen lymph nodes, body aches, joint swelling, chest pain, shortness of breath, mood changes.  Positive muscle aches  Objective  Blood pressure 124/74, pulse 87, height 5\' 6"  (1.676 m), weight 105 lb (47.6 kg), SpO2 94 %. Systems examined below as of 05/06/18   General: No apparent distress alert and oriented x3 mood and affect normal, dressed appropriately.  HEENT: Pupils equal, extraocular movements intact  Respiratory: Patient's speak in full sentences and does not appear short of breath  Cardiovascular: No lower extremity edema, non tender, no erythema  Skin: Warm dry intact with no signs of infection or rash on extremities or on axial skeleton.  Abdomen: Soft nontender   Neuro: Cranial nerves II through XII are intact, neurovascularly intact in all extremities with 2+ DTRs and 2+ pulses.  Lymph: No lymphadenopathy of posterior or anterior cervical chain or axillae bilaterally.  Gait normal with good balance and coordination.  MSK:  Non tender with full range of motion and good stability and symmetric strength and tone of shoulders, elbows, wrist, hip, knee and ankles bilaterally.  Back Exam:  Inspection: Degenerative scoliosis Motion: Flexion 45 deg, Extension 20 deg, Side Bending to 35 deg bilaterally,  Rotation to 45 deg bilaterally  SLR laying: Negative  XSLR laying: Negative  Palpable tenderness: Tender to palpation in the paraspinal musculature.  Mostly in the lumbar region.Marland Kitchen FABER: Tightness bilaterally. Sensory change: Gross sensation intact to all lumbar and sacral dermatomes.  Reflexes: 2+ at both patellar tendons, 2+ at achilles tendons, Babinski's downgoing.  Strength at foot  Plantar-flexion: 5/5 Dorsi-flexion: 5/5 Eversion: 5/5 Inversion: 5/5  Leg strength  Quad: 5/5 Hamstring: 5/5 Hip flexor: 5/5 Hip abductors: 4/5 symmetric Gait unremarkable.  Osteopathic findings C2 flexed rotated and side bent right C4 flexed rotated and side bent left C7 flexed rotated and side bent left T3 extended rotated and side bent right inhaled third rib T9 extended rotated and side bent left L2 flexed rotated and side bent right Sacrum right on right     Impression and Recommendations:     This case required medical decision making of moderate complexity.      Note: This dictation was prepared with Dragon dictation along with smaller phrase technology. Any transcriptional errors that result from this process are unintentional.

## 2018-05-06 ENCOUNTER — Ambulatory Visit: Payer: PPO | Admitting: Family Medicine

## 2018-05-06 DIAGNOSIS — M999 Biomechanical lesion, unspecified: Secondary | ICD-10-CM | POA: Diagnosis not present

## 2018-05-06 DIAGNOSIS — M533 Sacrococcygeal disorders, not elsewhere classified: Secondary | ICD-10-CM

## 2018-05-06 NOTE — Assessment & Plan Note (Signed)
Decision today to treat with OMT was based on Physical Exam  After verbal consent patient was treated with HVLA, ME, FPR techniques in cervical, thoracic, lumbar and sacral areas  Patient tolerated the procedure well with improvement in symptoms  Patient given exercises, stretches and lifestyle modifications  See medications in patient instructions if given  Patient will follow up in 6 weeks 

## 2018-05-06 NOTE — Assessment & Plan Note (Signed)
Discussed icing, HEP  Discussed which activities.  Much improved.  See me again In 6 weeks

## 2018-05-06 NOTE — Patient Instructions (Signed)
Always good to see you  [place]! Ice is you friend.  Stay active See me again in 3-4 weeks

## 2018-05-17 MED FILL — metroNIDAZOLE 1 % GEL: 1 | 30 days supply | Qty: 60 | Fill #1

## 2018-05-17 MED FILL — ESTRADIOL 0.1 MG/GM CREA: 0.1 | 90 days supply | Qty: 43 | Fill #2

## 2018-05-19 ENCOUNTER — Ambulatory Visit
Admission: RE | Admit: 2018-05-19 | Discharge: 2018-05-19 | Disposition: A | Discharge status: Home / Self Care | Payer: PPO | Source: Ambulatory Visit | Attending: Family Medicine | Admitting: Family Medicine

## 2018-05-19 ENCOUNTER — Encounter: Payer: Self-pay | Admitting: Family Medicine

## 2018-05-19 ENCOUNTER — Ambulatory Visit: Payer: PPO | Admitting: Family Medicine

## 2018-05-19 VITALS — BP 149/105 | Ht 66.0 in | Wt 105.0 lb

## 2018-05-19 DIAGNOSIS — M25572 Pain in left ankle and joints of left foot: Secondary | ICD-10-CM | POA: Diagnosis not present

## 2018-05-19 DIAGNOSIS — T148XXA Other injury of unspecified body region, initial encounter: Secondary | ICD-10-CM | POA: Insufficient documentation

## 2018-05-19 DIAGNOSIS — S99912A Unspecified injury of left ankle, initial encounter: Secondary | ICD-10-CM | POA: Diagnosis not present

## 2018-05-19 NOTE — Assessment & Plan Note (Signed)
Patient is here with signs and symptoms consistent with an acute traumatic distal tibial bone contusion.  X-rays obtained today were ultimately negative for fracture.  Patient is able to ambulate with only a mild limp.  Strength and sensation fully intact. -Encouraged regular elevation, compression, ice, and relative rest. -Tylenol PRN for pain -Allow up to 4 to 6 weeks for healing. -Follow-up PRN

## 2018-05-19 NOTE — Progress Notes (Signed)
   HPI  CC: Acute lower left leg injury. Patient is here presenting with acute lower left medial leg pain after sustaining an injury over the weekend.  She states that on Saturday she dropped a metal water bottle which landed directly on the medial aspect of her left lower leg.  She states that since this event she has had significant pain and discomfort as well as swelling and ecchymosis in this area.  She denies any inability to weight-bear.  She endorses a slight limp.  She states that immediately after this injury she was able to complete the bike ride that she had just begun at the time of the incident.  Patient denies any mechanism of twisting or turning.  She denies any weakness, numbness, or paresthesias.  No feelings of instability.  Medications/Interventions Tried: Relative rest and ice  See HPI and/or previous note for associated ROS.  Objective: BP (!) 149/105   Ht 5\' 6"  (1.676 m)   Wt 105 lb (47.6 kg)   BMI 16.95 kg/m  Gen: NAD, well groomed, a/o x3, normal affect.  CV: Well-perfused. Warm.  Resp: Non-labored.  Neuro: Sensation intact throughout. No gross coordination deficits.  Gait: Nonpathologic posture, unremarkable stride without signs of limp or balance issues. Ankle/Foot, left: TTP noted at the medial aspect of the distal tibia approximately 3 cm proximal to the medial malleolus with extension distally along the medial aspect into the hindfoot. Notable erythema, swelling, and ecchymosis extending from the distal medial tibia to the hindfoot and midfoot. No bony deformity. ROM is full in all directions. Strength is 5/5 in all directions. Stable lateral and medial ligaments; Unremarkable squeeze and kleiger tests; Talar dome nontender; Unremarkable calcaneal squeeze; No tenderness over the navicular prominence; No tenderness over cuboid; No pain at base of 5th MT; No tenderness at the distal metatarsals; Able to walk 4 steps.   XR: Ankle, left: Unremarkable.  No acute bony  abnormality or malalignment.   Assessment and plan:  Contusion of bone Patient is here with signs and symptoms consistent with an acute traumatic distal tibial bone contusion.  X-rays obtained today were ultimately negative for fracture.  Patient is able to ambulate with only a mild limp.  Strength and sensation fully intact. -Encouraged regular elevation, compression, ice, and relative rest. -Tylenol PRN for pain -Allow up to 4 to 6 weeks for healing. -Follow-up PRN   Orders Placed This Encounter  Procedures  . DG Ankle Complete Left    Standing Status:   Future    Number of Occurrences:   1    Standing Expiration Date:   07/20/2019    Order Specific Question:   Reason for Exam (SYMPTOM  OR DIAGNOSIS REQUIRED)    Answer:   left ankle pain    Order Specific Question:   Preferred imaging location?    Answer:   [facility]    Order Specific Question:   Radiology Contrast Protocol - do NOT remove file path    Answer:   [email or web address]    Elberta Leatherwood, MD,MS [facility] 05/19/2018 5:32 PM

## 2018-05-20 ENCOUNTER — Ambulatory Visit: Payer: PPO | Admitting: Family Medicine

## 2018-06-01 ENCOUNTER — Encounter: Payer: Self-pay | Admitting: Family Medicine

## 2018-06-01 ENCOUNTER — Ambulatory Visit: Payer: PPO | Admitting: Family Medicine

## 2018-06-01 VITALS — BP 122/82 | HR 81 | Ht 66.0 in | Wt 107.0 lb

## 2018-06-01 DIAGNOSIS — M999 Biomechanical lesion, unspecified: Secondary | ICD-10-CM

## 2018-06-01 DIAGNOSIS — M533 Sacrococcygeal disorders, not elsewhere classified: Secondary | ICD-10-CM | POA: Diagnosis not present

## 2018-06-01 NOTE — Assessment & Plan Note (Signed)
Discussed icing regimen, home exercise, which activities of doing which wants to avoid.  Increase activity slowly over the course the next several days.  Discussed icing regimen.  Follow-up again in 3 to 4 weeks.

## 2018-06-01 NOTE — Patient Instructions (Signed)
Good to see you  Happy 4th  See you again in 3-4 weeks

## 2018-06-01 NOTE — Progress Notes (Signed)
[name] [place], [place] [address] Phone: [phone] Subjective:    I'm seeing this patient by the request  of:    CC: Back and neck pain follow-up  HWT:[id number]  Tracey [name] is a 67 y.o. female coming in with complaint of back pain. Her back has been doing good. [place] and feels stiff today.  Patient about 20 miles and noticing more of the tightness.  Seems to be more in the neck as well as the lower back.  More musculature.  No radiation to the upper extremities or down the legs.      Past Medical History:  Diagnosis Date  . Closed hip fracture ([facility]) 03/2016   RT HIP  . Hypertension   . Osteopenia   . Osteoporosis    h/o  . Skin cancer    multiple skin cancers   Past Surgical History:  Procedure Laterality Date  . CALDWELL LUC    . COLONOSCOPY WITH PROPOFOL N/A 03/22/2014   Procedure: COLONOSCOPY WITH PROPOFOL;  Surgeon: Arta Silence, MD;  [facility]: [facility];  Service: Endoscopy;  Laterality: N/A;  . HIP PINNING,CANNULATED Right 03/21/2016   Procedure: CANNULATED HIP PINNING;  Surgeon: Rod Can, MD;  Location: [facility];  Service: Orthopedics;  Laterality: Right;  . NASAL SEPTUM SURGERY     past hx. deviated septum  . TONSILLECTOMY     Social History   Socioeconomic History  . Marital status: Married    Spouse name: [name]  . Number of children: 1  . Years of education: Not on file  . Highest education level: Not on file  Occupational History  . Occupation: MED TECH    Employer: [employer]  Social Needs  . Financial resource strain: Not on file  . Food insecurity:    Worry: Not on file    Inability: Not on file  . Transportation needs:    Medical: Not on file    Non-medical: Not on file  Tobacco Use  . Smoking status: Never Smoker  . Smokeless tobacco: Never Used  Substance and Sexual Activity  . Alcohol use: Yes    Alcohol/week: 0.2 oz  .  Drug use: No  . Sexual activity: Yes    Partners: Male    Comment: married, [place]  Lifestyle  . Physical activity:    Days per week: Not on file    Minutes per session: Not on file  . Stress: Not on file  Relationships  . Social connections:    Talks on phone: Not on file    Gets together: Not on file    Attends religious service: Not on file    Active member of club or organization: Not on file    Attends meetings of clubs or organizations: Not on file    Relationship status: Not on file  Other Topics Concern  . Not on file  Social History Narrative  . Not on file   Allergies  Allergen Reactions  . Cefaclor     REACTION: urticaria (hives)  . Neomycin-Bacitracin Zn-Polymyx     REACTION: rash   Family History  Problem Relation Age of Onset  . Hypertension Mother   . Leukemia Father   . Hypertension Father   . Diabetes Brother   . Hypertension Brother   . Hyperlipidemia Maternal Grandmother   . Cancer Maternal Grandfather   . Heart disease Paternal Grandfather   .  Stroke Paternal Grandfather      Past medical history, social, surgical and family history all reviewed in electronic medical record.  No pertanent information unless stated regarding to the chief complaint.   Review of Systems:Review of systems updated and as accurate as of 06/01/18  No headache, visual changes, nausea, vomiting, diarrhea, constipation, dizziness, abdominal pain, skin rash, fevers, chills, night sweats, weight loss, swollen lymph nodes, body aches, joint swelling, muscle aches, chest pain, shortness of breath, mood changes.   Objective  Blood pressure 122/82, pulse 81, height 5\' 6"  (1.676 m), weight 107 lb (48.5 kg), SpO2 99 %. Systems examined below as of 06/01/18   General: No apparent distress alert and oriented x3 mood and affect normal, dressed appropriately.  HEENT: Pupils equal, extraocular movements intact  Respiratory: Patient's speak in full sentences and does not appear  short of breath  Cardiovascular: No lower extremity edema, non tender, no erythema  Skin: Warm dry intact with no signs of infection or rash on extremities or on axial skeleton.  Abdomen: Soft nontender  Neuro: Cranial nerves II through XII are intact, neurovascularly intact in all extremities with 2+ DTRs and 2+ pulses.  Lymph: No lymphadenopathy of posterior or anterior cervical chain or axillae bilaterally.  Gait normal with good balance and coordination.  MSK:  Non tender with full range of motion and good stability and symmetric strength and tone of shoulders, elbows, wrist, hip, knee and ankles bilaterally.  Arthritic changes of multiple joints Back exam does have some degenerative scoliosis noted.  Tightness of the hip flexors bilaterally.  Right greater than left.  Positive Faber on the right.  Negative straight leg test bilaterally with neurovascularly intact.  Neck exam shows some mild loss of lordosis.  Crepitus with range of motion but full range of motion.  Negative Spurling's.  Osteopathic findings C4 flexed rotated and side bent left C6 flexed rotated and side bent left T3 extended rotated and side bent right inhaled third rib L2 flexed rotated and side bent right Sacrum right on right     Impression and Recommendations:     This case required medical decision making of moderate complexity.      Note: This dictation was prepared with Dragon dictation along with smaller phrase technology. Any transcriptional errors that result from this process are unintentional.

## 2018-06-01 NOTE — Assessment & Plan Note (Signed)
Decision today to treat with OMT was based on Physical Exam  After verbal consent patient was treated with  ME, FPR techniques in cervical, thoracic, lumbar and sacral areas  Patient tolerated the procedure well with improvement in symptoms  Patient given exercises, stretches and lifestyle modifications  See medications in patient instructions if given  Patient will follow up in 3-4 weeks 

## 2018-06-21 DIAGNOSIS — N952 Postmenopausal atrophic vaginitis: Secondary | ICD-10-CM | POA: Diagnosis not present

## 2018-06-21 DIAGNOSIS — Z01419 Encounter for gynecological examination (general) (routine) without abnormal findings: Secondary | ICD-10-CM | POA: Diagnosis not present

## 2018-06-21 DIAGNOSIS — Z681 Body mass index (BMI) 19 or less, adult: Secondary | ICD-10-CM | POA: Diagnosis not present

## 2018-06-21 DIAGNOSIS — L309 Dermatitis, unspecified: Secondary | ICD-10-CM | POA: Diagnosis not present

## 2018-06-22 MED FILL — SSD 1% CREAM: 1 | 14 days supply | Qty: 15 | Fill #0

## 2018-06-28 NOTE — Progress Notes (Signed)
[name] [place], [place] [address] Phone: [phone] Subjective:      CC: Back pain  HKV:[id number]  Tracey [name] is a 67 y.o. female coming in with complaint of back pain. States she is tight today.  Patient does have arthritic changes of the back noted.  Has had more tightness recently.  Was doing caning of tomatoes the other day.  Feels that that seem to make it worse.      Past Medical History:  Diagnosis Date  . Closed hip fracture ([place]) 03/2016   RT HIP  . Hypertension   . Osteopenia   . Osteoporosis    h/o  . Skin cancer    multiple skin cancers   Past Surgical History:  Procedure Laterality Date  . CALDWELL LUC    . COLONOSCOPY WITH PROPOFOL N/A 03/22/2014   Procedure: COLONOSCOPY WITH PROPOFOL;  Surgeon: Arta Silence, MD;  Location: [facility];  Service: Endoscopy;  Laterality: N/A;  . HIP PINNING,CANNULATED Right 03/21/2016   Procedure: CANNULATED HIP PINNING;  Surgeon: Rod Can, MD;  Location: [facility];  Service: Orthopedics;  Laterality: Right;  . NASAL SEPTUM SURGERY     past hx. deviated septum  . TONSILLECTOMY     Social History   Socioeconomic History  . Marital status: Married    Spouse name: [name]  . Number of children: 1  . Years of education: Not on file  . Highest education level: Not on file  Occupational History  . Occupation: MED TECH    Employer: [employer]  Social Needs  . Financial resource strain: Not on file  . Food insecurity:    Worry: Not on file    Inability: Not on file  . Transportation needs:    Medical: Not on file    Non-medical: Not on file  Tobacco Use  . Smoking status: Never Smoker  . Smokeless tobacco: Never Used  Substance and Sexual Activity  . Alcohol use: Yes    Alcohol/week: 0.2 oz  . Drug use: No  . Sexual activity: Yes    Partners: Male    Comment: married, [place]  Lifestyle  . Physical activity:    Days per week: Not on file   Minutes per session: Not on file  . Stress: Not on file  Relationships  . Social connections:    Talks on phone: Not on file    Gets together: Not on file    Attends religious service: Not on file    Active member of club or organization: Not on file    Attends meetings of clubs or organizations: Not on file    Relationship status: Not on file  Other Topics Concern  . Not on file  Social History Narrative  . Not on file   Allergies  Allergen Reactions  . Cefaclor     REACTION: urticaria (hives)  . Neomycin-Bacitracin Zn-Polymyx     REACTION: rash   Family History  Problem Relation Age of Onset  . Hypertension Mother   . Leukemia Father   . Hypertension Father   . Diabetes Brother   . Hypertension Brother   . Hyperlipidemia Maternal Grandmother   . Cancer Maternal Grandfather   . Heart disease Paternal Grandfather   . Stroke Paternal Grandfather      Past medical history, social, surgical and family history all reviewed in electronic medical record.  No pertanent information unless stated regarding to the chief complaint.  Review of Systems:Review of systems updated and as accurate as of 06/29/18  No headache, visual changes, nausea, vomiting, diarrhea, constipation, dizziness, abdominal pain, skin rash, fevers, chills, night sweats, weight loss, swollen lymph nodes, body aches, joint swelling, muscle aches, chest pain, shortness of breath, mood changes.   Objective  Blood pressure (!) 142/80, pulse 66, height 5\' 6"  (1.676 m), weight 104 lb (47.2 kg), SpO2 96 %. Systems examined below as of 06/29/18   General: No apparent distress alert and oriented x3 mood and affect normal, dressed appropriately.  HEENT: Pupils equal, extraocular movements intact  Respiratory: Patient's speak in full sentences and does not appear short of breath  Cardiovascular: No lower extremity edema, non tender, no erythema  Skin: Warm dry intact with no signs of infection or rash on extremities  or on axial skeleton.  Abdomen: Soft nontender  Neuro: Cranial nerves II through XII are intact, neurovascularly intact in all extremities with 2+ DTRs and 2+ pulses.  Lymph: No lymphadenopathy of posterior or anterior cervical chain or axillae bilaterally.  Gait normal with good balance and coordination.  MSK:  tender with full range of motion and good stability and symmetric strength and tone of shoulders, elbows, wrist, hip, knee and ankles bilaterally.  Arthritic changes Back Exam:  Inspection: Loss of lordosis degenerative scoliosis Motion: Flexion 45 deg, Extension 25 deg, Side Bending to 45 deg bilaterally,  Rotation to 45 deg bilaterally  SLR laying: Negative  XSLR laying: Negative  Palpable tenderness: Tender to palpation in the paraspinal musculature.Marland Kitchen FABER: Tightness bilaterally. Sensory change: Gross sensation intact to all lumbar and sacral dermatomes.  Reflexes: 2+ at both patellar tendons, 2+ at achilles tendons, Babinski's downgoing.  Strength at foot  Plantar-flexion: 5/5 Dorsi-flexion: 5/5 Eversion: 5/5 Inversion: 5/5  Leg strength  Quad: 5/5 Hamstring: 5/5 Hip flexor: 5/5 Hip abductors: 4/5 and symmetric Gait unremarkable.  Osteopathic findings C4 flexed rotated and side bent left C6 flexed rotated and side bent right  T3 extended rotated and side bent right inhaled third rib T6 extended rotated and side bent left L3 flexed rotated and side bent right L5 flexed rotated and side bent left Sacrum right on right    Impression and Recommendations:     This case required medical decision making of moderate complexity.      Note: This dictation was prepared with Dragon dictation along with smaller phrase technology. Any transcriptional errors that result from this process are unintentional.

## 2018-06-29 ENCOUNTER — Ambulatory Visit: Payer: PPO | Admitting: Family Medicine

## 2018-06-29 ENCOUNTER — Encounter: Payer: Self-pay | Admitting: Family Medicine

## 2018-06-29 VITALS — BP 142/80 | HR 66 | Ht 66.0 in | Wt 104.0 lb

## 2018-06-29 DIAGNOSIS — M533 Sacrococcygeal disorders, not elsewhere classified: Secondary | ICD-10-CM | POA: Diagnosis not present

## 2018-06-29 DIAGNOSIS — M999 Biomechanical lesion, unspecified: Secondary | ICD-10-CM

## 2018-06-29 NOTE — Assessment & Plan Note (Signed)
Decision today to treat with OMT was based on Physical Exam  After verbal consent patient was treated with HVLA, ME, FPR techniques in cervical, thoracic, rib,  lumbar and sacral areas  Patient tolerated the procedure well with improvement in symptoms  Patient given exercises, stretches and lifestyle modifications  See medications in patient instructions if given  Patient will follow up in 4-8 weeks 

## 2018-06-29 NOTE — Assessment & Plan Note (Signed)
Sacroiliac dysfunction with degenerative disc disease.  Patient discussed icing regimen and home exercise with working on patient and flexor.  We discussed with activities of doing which wants to avoid.

## 2018-06-29 NOTE — Patient Instructions (Signed)
Good to see you  Ice is your friend Stay active overall  One knee down one knee up tilt pelvis forward.  Hands overhead then rotate to upper leg.  Hold 10 seconds, relax, repeat and do other side as well.  Very important when after being in a flexed position for a long amount of time.  See me again in 3 weeks

## 2018-07-07 MED FILL — FOLIC ACID 1 MG TABS: 1 | 90 days supply | Qty: 90 | Fill #3

## 2018-07-07 MED FILL — CYCLOBENZAPRINE 10 MG TAB: 10 | 60 days supply | Qty: 30 | Fill #3

## 2018-07-07 MED FILL — AMLODIPINE BESYLATE 5 MG TA: 5 | 90 days supply | Qty: 90 | Fill #3

## 2018-07-21 ENCOUNTER — Ambulatory Visit: Payer: PPO | Admitting: Family Medicine

## 2018-07-27 NOTE — Progress Notes (Signed)
[name] [place], [place] [address] Phone: [phone] Subjective:    CC: Lower back and hip pain follow-up  VEH:[id number]  Tracey [name] is a 67 y.o. female coming in with complaint of hip pain. States she is painful today. Has been riding bikes a lot often lately.  Known osteoporosis, as well as degenerative disc disease and piriformis syndrome.  Having some mild increase in radiation down the leg.  States that it is wearing sore.  Patient rates the severity of pain is 7 out of 10.  Not responding to the exercises as much as it was in the past.  Patient continues to ride her bike 20 to 30 miles a week.     Past Medical History:  Diagnosis Date  . Closed hip fracture ([place]) 03/2016   RT HIP  . Hypertension   . Osteopenia   . Osteoporosis    h/o  . Skin cancer    multiple skin cancers   Past Surgical History:  Procedure Laterality Date  . CALDWELL LUC    . COLONOSCOPY WITH PROPOFOL N/A 03/22/2014   Procedure: COLONOSCOPY WITH PROPOFOL;  Surgeon: Arta Silence, MD;  Location: [facility];  Service: Endoscopy;  Laterality: N/A;  . HIP PINNING,CANNULATED Right 03/21/2016   Procedure: CANNULATED HIP PINNING;  Surgeon: Rod Can, MD;  Location: [facility];  Service: Orthopedics;  Laterality: Right;  . NASAL SEPTUM SURGERY     past hx. deviated septum  . TONSILLECTOMY     Social History   Socioeconomic History  . Marital status: Married    Spouse name: [name]  . Number of children: 1  . Years of education: Not on file  . Highest education level: Not on file  Occupational History  . Occupation: MED TECH    Employer: [employer]  Social Needs  . Financial resource strain: Not on file  . Food insecurity:    Worry: Not on file    Inability: Not on file  . Transportation needs:    Medical: Not on file    Non-medical: Not on file  Tobacco Use  . Smoking status: Never Smoker  . Smokeless tobacco: Never Used  Substance  and Sexual Activity  . Alcohol use: Yes    Alcohol/week: 0.3 standard drinks  . Drug use: No  . Sexual activity: Yes    Partners: Male    Comment: married, [place]  Lifestyle  . Physical activity:    Days per week: Not on file    Minutes per session: Not on file  . Stress: Not on file  Relationships  . Social connections:    Talks on phone: Not on file    Gets together: Not on file    Attends religious service: Not on file    Active member of club or organization: Not on file    Attends meetings of clubs or organizations: Not on file    Relationship status: Not on file  Other Topics Concern  . Not on file  Social History Narrative  . Not on file   Allergies  Allergen Reactions  . Cefaclor     REACTION: urticaria (hives)  . Neomycin-Bacitracin Zn-Polymyx     REACTION: rash   Family History  Problem Relation Age of Onset  . Hypertension Mother   . Leukemia Father   . Hypertension Father   . Diabetes Brother   . Hypertension Brother   . Hyperlipidemia Maternal Grandmother   . Cancer  Maternal Grandfather   . Heart disease Paternal Grandfather   . Stroke Paternal Grandfather      Current Outpatient Medications (Cardiovascular):  .  amLODipine (NORVASC) 5 MG tablet, Take 5 mg by mouth every evening.   Current Outpatient Medications (Analgesics):  .  aspirin 81 MG tablet, Take 81 mg by mouth daily.  Current Outpatient Medications (Hematological):  .  folic acid (FOLVITE) 1 MG tablet, Take 1 mg by mouth daily.    Current Outpatient Medications (Other):  Marland Kitchen  Ascorbic Acid (VITAMIN C PO), Take 1 tablet by mouth daily. Marland Kitchen  CALCIUM PO, Take 1 tablet by mouth daily. .  Cholecalciferol (VITAMIN D PO), Take 1 tablet by mouth daily. .  cyclobenzaprine (FLEXERIL) 10 MG tablet, Take 0.5 tablets (5 mg total) by mouth at bedtime. Marland Kitchen  ESTRACE VAGINAL 0.1 MG/GM vaginal cream,  .  fish oil-omega-3 fatty acids 1000 MG capsule, Take 1 g by mouth 2 (two) times daily.  Marland Kitchen   gabapentin (NEURONTIN) 400 MG capsule, Take 400 mg by mouth at bedtime. .  Multiple Vitamin (MULTIVITAMIN WITH MINERALS) TABS tablet, Take 1 tablet by mouth daily.    Past medical history, social, surgical and family history all reviewed in electronic medical record.  No pertanent information unless stated regarding to the chief complaint.   Review of Systems:  No headache, visual changes, nausea, vomiting, diarrhea, constipation, dizziness, abdominal pain, skin rash, fevers, chills, night sweats, weight loss, swollen lymph nodes, body aches, joint swelling, muscle aches, chest pain, shortness of breath, mood changes.   Objective  Blood pressure 120/70, pulse 83, height 5\' 6"  (1.676 m), weight 107 lb (48.5 kg), SpO2 98 %. Systems examined below as of    General: No apparent distress alert and oriented x3 mood and affect normal, dressed appropriately.  HEENT: Pupils equal, extraocular movements intact  Respiratory: Patient's speak in full sentences and does not appear short of breath  Cardiovascular: No lower extremity edema, non tender, no erythema  Skin: Warm dry intact with no signs of infection or rash on extremities or on axial skeleton.  Abdomen: Soft nontender  Neuro: Cranial nerves II through XII are intact, neurovascularly intact in all extremities with 2+ DTRs and 2+ pulses.  Lymph: No lymphadenopathy of posterior or anterior cervical chain or axillae bilaterally.  Gait normal with good balance and coordination.  MSK:  Non tender with full range of motion and good stability and symmetric strength and tone of shoulders, elbows, wrist, hip, knee and ankles bilaterally.  Back Exam:  Inspection: Unremarkable  Motion: Flexion 45 deg, Extension 25 deg, Side Bending to 35 deg bilaterally,  Rotation to 45 deg bilaterally  SLR laying: Negative  XSLR laying: Negative  Palpable tenderness: Tender to palpation the paraspinal musculature lumbar spine right greater than left. FABER:  Tightness on the left. Sensory change: Gross sensation intact to all lumbar and sacral dermatomes.  Reflexes: 2+ at both patellar tendons, 2+ at achilles tendons, Babinski's downgoing.  Strength at foot  Plantar-flexion: 5/5 Dorsi-flexion: 5/5 Eversion: 5/5 Inversion: 5/5  Leg strength  Quad: 5/5 Hamstring: 5/5 Hip flexor: 5/5 Hip abductors: 5/5  Gait unremarkable.  Osteopathic findings C2 flexed rotated and side bent right C4 flexed rotated and side bent left C7 flexed rotated and side bent left T3 extended rotated and side bent right inhaled third rib T6 extended rotated and side bent left L2 flexed rotated and side bent right Sacrum left on left     Impression and Recommendations:  This case required medical decision making of moderate complexity. The above documentation has been reviewed and is accurate and complete [name], DO       Note: This dictation was prepared with Dragon dictation along with smaller phrase technology. Any transcriptional errors that result from this process are unintentional.

## 2018-07-28 ENCOUNTER — Ambulatory Visit: Payer: PPO | Admitting: Family Medicine

## 2018-07-28 ENCOUNTER — Encounter: Payer: Self-pay | Admitting: Family Medicine

## 2018-07-28 VITALS — BP 120/70 | HR 83 | Ht 66.0 in | Wt 107.0 lb

## 2018-07-28 DIAGNOSIS — M533 Sacrococcygeal disorders, not elsewhere classified: Secondary | ICD-10-CM | POA: Diagnosis not present

## 2018-07-28 DIAGNOSIS — M999 Biomechanical lesion, unspecified: Secondary | ICD-10-CM | POA: Diagnosis not present

## 2018-07-28 NOTE — Assessment & Plan Note (Signed)
Decision today to treat with OMT was based on Physical Exam  After verbal consent patient was treated with HVLA, ME, FPR techniques in cervical, thoracic, rib, lumbar and sacral areas  Patient tolerated the procedure well with improvement in symptoms  Patient given exercises, stretches and lifestyle modifications  See medications in patient instructions if given  Patient will follow up in 3 weeks 

## 2018-07-28 NOTE — Assessment & Plan Note (Signed)
Sacroiliac dysfunction.  Discussed icing regimen and home exercises.  Discussed which activities of doing which wants to avoid.  Patient is to increase activity slowly over the next several days.  We discussed the possibility of a sacroiliac injection potential.  Patient will follow-up with me again in 3 weeks

## 2018-07-28 NOTE — Patient Instructions (Signed)
Good to see you  Ice is your friend  Compression socks still  Stay hydrated  Piriformis stretching after See me again in 3 weeks and if not all the way better we will do injection

## 2018-08-10 DIAGNOSIS — H9202 Otalgia, left ear: Secondary | ICD-10-CM | POA: Diagnosis not present

## 2018-08-13 NOTE — Progress Notes (Signed)
[name] [place], [place] [address] Phone: [phone] Subjective:   [name], am serving as a scribe for Dr. Hulan Saas.  I'm seeing this patient by the request  of:    CC: Back pain follow-up  LEX:[id number]  Tracey [name] is a 67 y.o. female coming in with complaint of back pain. Still having pain on left side of her back. When she is biking she does not have pain. Patient has not biked since last Monday and she is having more pain now.  Was having more of a left-sided piriformis problem.  States that it continues to be there.  Describes as a dull, throbbing aching sensation.     Past Medical History:  Diagnosis Date  . Closed hip fracture ([place]) 03/2016   RT HIP  . Hypertension   . Osteopenia   . Osteoporosis    h/o  . Skin cancer    multiple skin cancers   Past Surgical History:  Procedure Laterality Date  . CALDWELL LUC    . COLONOSCOPY WITH PROPOFOL N/A 03/22/2014   Procedure: COLONOSCOPY WITH PROPOFOL;  Surgeon: Arta Silence, MD;  Location: [facility];  Service: Endoscopy;  Laterality: N/A;  . HIP PINNING,CANNULATED Right 03/21/2016   Procedure: CANNULATED HIP PINNING;  Surgeon: Rod Can, MD;  Location: [facility];  Service: Orthopedics;  Laterality: Right;  . NASAL SEPTUM SURGERY     past hx. deviated septum  . TONSILLECTOMY     Social History   Socioeconomic History  . Marital status: Married    Spouse name: [name]  . Number of children: 1  . Years of education: Not on file  . Highest education level: Not on file  Occupational History  . Occupation: MED TECH    Employer: [employer]  Social Needs  . Financial resource strain: Not on file  . Food insecurity:    Worry: Not on file    Inability: Not on file  . Transportation needs:    Medical: Not on file    Non-medical: Not on file  Tobacco Use  . Smoking status: Never Smoker  . Smokeless tobacco: Never Used  Substance and Sexual  Activity  . Alcohol use: Yes    Alcohol/week: 0.3 standard drinks  . Drug use: No  . Sexual activity: Yes    Partners: Male    Comment: married, [place]  Lifestyle  . Physical activity:    Days per week: Not on file    Minutes per session: Not on file  . Stress: Not on file  Relationships  . Social connections:    Talks on phone: Not on file    Gets together: Not on file    Attends religious service: Not on file    Active member of club or organization: Not on file    Attends meetings of clubs or organizations: Not on file    Relationship status: Not on file  Other Topics Concern  . Not on file  Social History Narrative  . Not on file   Allergies  Allergen Reactions  . Cefaclor     REACTION: urticaria (hives)  . Neomycin-Bacitracin Zn-Polymyx     REACTION: rash   Family History  Problem Relation Age of Onset  . Hypertension Mother   . Leukemia Father   . Hypertension Father   . Diabetes Brother   . Hypertension Brother   . Hyperlipidemia Maternal Grandmother   . Cancer Maternal Grandfather   .  Heart disease Paternal Grandfather   . Stroke Paternal Grandfather      Current Outpatient Medications (Cardiovascular):  .  amLODipine (NORVASC) 5 MG tablet, Take 5 mg by mouth every evening.   Current Outpatient Medications (Analgesics):  .  aspirin 81 MG tablet, Take 81 mg by mouth daily.  Current Outpatient Medications (Hematological):  .  folic acid (FOLVITE) 1 MG tablet, Take 1 mg by mouth daily.    Current Outpatient Medications (Other):  Marland Kitchen  Ascorbic Acid (VITAMIN C PO), Take 1 tablet by mouth daily. Marland Kitchen  CALCIUM PO, Take 1 tablet by mouth daily. .  Cholecalciferol (VITAMIN D PO), Take 1 tablet by mouth daily. .  cyclobenzaprine (FLEXERIL) 10 MG tablet, Take 0.5 tablets (5 mg total) by mouth at bedtime. Marland Kitchen  ESTRACE VAGINAL 0.1 MG/GM vaginal cream,  .  fish oil-omega-3 fatty acids 1000 MG capsule, Take 1 g by mouth 2 (two) times daily.  Marland Kitchen  gabapentin  (NEURONTIN) 400 MG capsule, Take 400 mg by mouth at bedtime. .  Multiple Vitamin (MULTIVITAMIN WITH MINERALS) TABS tablet, Take 1 tablet by mouth daily.    Past medical history, social, surgical and family history all reviewed in electronic medical record.  No pertanent information unless stated regarding to the chief complaint.   Review of Systems:  No headache, visual changes, nausea, vomiting, diarrhea, constipation, dizziness, abdominal pain, skin rash, fevers, chills, night sweats, weight loss, swollen lymph nodes, body aches, joint swelling,  chest pain, shortness of breath, mood changes.  Positive muscle aches  Objective  Blood pressure 138/84, pulse 81, height 5\' 6"  (1.676 m), weight 107 lb (48.5 kg), SpO2 96 %.    General: No apparent distress alert and oriented x3 mood and affect normal, dressed appropriately.  Underweight HEENT: Pupils equal, extraocular movements intact  Respiratory: Patient's speak in full sentences and does not appear short of breath  Cardiovascular: No lower extremity edema, non tender, no erythema  Skin: Warm dry intact with no signs of infection or rash on extremities or on axial skeleton.  Abdomen: Soft nontender  Neuro: Cranial nerves II through XII are intact, neurovascularly intact in all extremities with 2+ DTRs and 2+ pulses.  Lymph: No lymphadenopathy of posterior or anterior cervical chain or axillae bilaterally.  Gait antalgic.  MSK: Mild tender with mild limited range of motion and good stability and symmetric strength and tone of shoulders, elbows, wrist, hip, knee and ankles bilaterally.   Back exam shows some degenerative scoliosis with some loss of lordosis.  Patient does have tightness in all planes.  Worsening straight leg test on the left side.  Positive Faber test on the left side with radicular symptoms as well.  Osteopathic findings C2 flexed rotated and side bent right T3 extended rotated and side bent right inhaled third rib L3  flexed rotated and side bent right Sacrum right on right     Impression and Recommendations:     This case required medical decision making of moderate complexity. The above documentation has been reviewed and is accurate and complete [name], DO       Note: This dictation was prepared with Dragon dictation along with smaller phrase technology. Any transcriptional errors that result from this process are unintentional.

## 2018-08-16 ENCOUNTER — Other Ambulatory Visit: Payer: Self-pay

## 2018-08-16 ENCOUNTER — Other Ambulatory Visit: Payer: Self-pay | Admitting: Family Medicine

## 2018-08-16 ENCOUNTER — Ambulatory Visit: Payer: PPO | Admitting: Family Medicine

## 2018-08-16 ENCOUNTER — Telehealth: Payer: Self-pay | Admitting: *Deleted

## 2018-08-16 ENCOUNTER — Encounter: Payer: Self-pay | Admitting: Family Medicine

## 2018-08-16 VITALS — BP 138/84 | HR 81 | Ht 66.0 in | Wt 107.0 lb

## 2018-08-16 DIAGNOSIS — M810 Age-related osteoporosis without current pathological fracture: Secondary | ICD-10-CM | POA: Diagnosis not present

## 2018-08-16 DIAGNOSIS — R636 Underweight: Secondary | ICD-10-CM | POA: Diagnosis not present

## 2018-08-16 DIAGNOSIS — M999 Biomechanical lesion, unspecified: Secondary | ICD-10-CM | POA: Diagnosis not present

## 2018-08-16 MED ORDER — PREDNISONE 50 MG PO TABS: ORAL_TABLET | ORAL | 0 refills | Status: DC

## 2018-08-16 MED ORDER — FLUCONAZOLE 150 MG PO TABS: 150.0000 mg | ORAL_TABLET | Freq: Once | ORAL | 0 refills | Status: AC

## 2018-08-16 MED FILL — FLUCONAZOLE 150 MG TABS: 150 | 1 days supply | Qty: 1 | Fill #0

## 2018-08-16 MED FILL — predniSONE 50 MG TABS: 50 | 5 days supply | Qty: 5 | Fill #0

## 2018-08-16 NOTE — Telephone Encounter (Signed)
Copied from [facility] [id number]. Topic: Inquiry >> Aug 16, 2018  2:42 PM Oliver Pila B wrote: Reason for CRM: pt called to state her pain has been really bad since she left this morning; ibuprofen and icing has helped; asking if she should cancel her trip this weekend or just get an injection

## 2018-08-16 NOTE — Telephone Encounter (Signed)
Sent in prednsione  Can do that or we can work her in for injeciton if she needs on Wed.

## 2018-08-16 NOTE — Progress Notes (Signed)
Sent in med for her when on prednisaone

## 2018-08-16 NOTE — Patient Instructions (Signed)
Good to see you  Ice is your friend I am glad you are doing a little better Keep it up  See you again next week and if worse then consider piriformis injection

## 2018-08-16 NOTE — Assessment & Plan Note (Signed)
Decision today to treat with OMT was based on Physical Exam  After verbal consent patient was treated with HVLA, ME, FPR techniques in cervical, thoracic, rib, lumbar and sacral areas  Patient tolerated the procedure well with improvement in symptoms  Patient given exercises, stretches and lifestyle modifications  See medications in patient instructions if given  Patient will follow up in 4 weeks 

## 2018-08-16 NOTE — Assessment & Plan Note (Signed)
Continues to have difficulty gaining weight.

## 2018-08-16 NOTE — Assessment & Plan Note (Signed)
Discussed vitamin D supplementation. 

## 2018-08-17 NOTE — Telephone Encounter (Signed)
rx has been sent to pharmacy

## 2018-08-24 ENCOUNTER — Other Ambulatory Visit: Payer: Self-pay | Admitting: Obstetrics and Gynecology

## 2018-08-24 ENCOUNTER — Ambulatory Visit: Payer: PPO | Admitting: Family Medicine

## 2018-08-24 ENCOUNTER — Encounter: Payer: Self-pay | Admitting: Family Medicine

## 2018-08-24 VITALS — BP 118/74 | HR 87 | Ht 66.0 in | Wt 103.0 lb

## 2018-08-24 DIAGNOSIS — Z1231 Encounter for screening mammogram for malignant neoplasm of breast: Secondary | ICD-10-CM

## 2018-08-24 DIAGNOSIS — Z23 Encounter for immunization: Secondary | ICD-10-CM | POA: Diagnosis not present

## 2018-08-24 DIAGNOSIS — G5702 Lesion of sciatic nerve, left lower limb: Secondary | ICD-10-CM

## 2018-08-24 DIAGNOSIS — M999 Biomechanical lesion, unspecified: Secondary | ICD-10-CM

## 2018-08-24 NOTE — Progress Notes (Signed)
[name] [place], [place] [address] Phone: [phone] Subjective:    I [name] am serving as a Education administrator for Dr. Hulan Saas.  CC: back pain   [id number]  Tracey [name] is a 67 y.o. female coming in with complaint of back pain. States her back is not doing well. Feels the same as last time.  Patient was having more buttock pain.  Left side.  Fairly severe.  Rates the severity pain is 7 out of 10.  Still not going away.  Did do a lot of biking last weekend.      Past Medical History:  Diagnosis Date  . Closed hip fracture ([facility]) 03/2016   RT HIP  . Hypertension   . Osteopenia   . Osteoporosis    h/o  . Skin cancer    multiple skin cancers   Past Surgical History:  Procedure Laterality Date  . CALDWELL LUC    . COLONOSCOPY WITH PROPOFOL N/A 03/22/2014   Procedure: COLONOSCOPY WITH PROPOFOL;  Surgeon: Arta Silence, MD;  Location: [facility];  Service: Endoscopy;  Laterality: N/A;  . HIP PINNING,CANNULATED Right 03/21/2016   Procedure: CANNULATED HIP PINNING;  Surgeon: Rod Can, MD;  Location: [facility];  Service: Orthopedics;  Laterality: Right;  . NASAL SEPTUM SURGERY     past hx. deviated septum  . TONSILLECTOMY     Social History   Socioeconomic History  . Marital status: Married    Spouse name: [name]  . Number of children: 1  . Years of education: Not on file  . Highest education level: Not on file  Occupational History  . Occupation: MED TECH    Employer: [employer]  Social Needs  . Financial resource strain: Not on file  . Food insecurity:    Worry: Not on file    Inability: Not on file  . Transportation needs:    Medical: Not on file    Non-medical: Not on file  Tobacco Use  . Smoking status: Never Smoker  . Smokeless tobacco: Never Used  Substance and Sexual Activity  . Alcohol use: Yes    Alcohol/week: 0.3 standard drinks  . Drug use: No  . Sexual activity: Yes    Partners: Male      Comment: married, [place]  Lifestyle  . Physical activity:    Days per week: Not on file    Minutes per session: Not on file  . Stress: Not on file  Relationships  . Social connections:    Talks on phone: Not on file    Gets together: Not on file    Attends religious service: Not on file    Active member of club or organization: Not on file    Attends meetings of clubs or organizations: Not on file    Relationship status: Not on file  Other Topics Concern  . Not on file  Social History Narrative  . Not on file   Allergies  Allergen Reactions  . Cefaclor     REACTION: urticaria (hives)  . Neomycin-Bacitracin Zn-Polymyx     REACTION: rash   Family History  Problem Relation Age of Onset  . Hypertension Mother   . Leukemia Father   . Hypertension Father   . Diabetes Brother   . Hypertension Brother   . Hyperlipidemia Maternal Grandmother   . Cancer Maternal Grandfather   . Heart disease Paternal Grandfather   . Stroke Paternal Grandfather  Current Outpatient Medications (Endocrine & Metabolic):  .  predniSONE (DELTASONE) 50 MG tablet, Take one tablet a day for the next 5 days.  Current Outpatient Medications (Cardiovascular):  .  amLODipine (NORVASC) 5 MG tablet, Take 5 mg by mouth every evening.   Current Outpatient Medications (Analgesics):  .  aspirin 81 MG tablet, Take 81 mg by mouth daily.  Current Outpatient Medications (Hematological):  .  folic acid (FOLVITE) 1 MG tablet, Take 1 mg by mouth daily.    Current Outpatient Medications (Other):  Marland Kitchen  Ascorbic Acid (VITAMIN C PO), Take 1 tablet by mouth daily. Marland Kitchen  CALCIUM PO, Take 1 tablet by mouth daily. .  Cholecalciferol (VITAMIN D PO), Take 1 tablet by mouth daily. .  cyclobenzaprine (FLEXERIL) 10 MG tablet, Take 0.5 tablets (5 mg total) by mouth at bedtime. Marland Kitchen  ESTRACE VAGINAL 0.1 MG/GM vaginal cream,  .  fish oil-omega-3 fatty acids 1000 MG capsule, Take 1 g by mouth 2 (two) times daily.  Marland Kitchen   gabapentin (NEURONTIN) 400 MG capsule, Take 400 mg by mouth at bedtime. .  Multiple Vitamin (MULTIVITAMIN WITH MINERALS) TABS tablet, Take 1 tablet by mouth daily.    Past medical history, social, surgical and family history all reviewed in electronic medical record.  No pertanent information unless stated regarding to the chief complaint.   Review of Systems:  No headache, visual changes, nausea, vomiting, diarrhea, constipation, dizziness, abdominal pain, skin rash, fevers, chills, night sweats, weight loss, swollen lymph nodes, body aches, joint swelling, muscle aches, chest pain, shortness of breath, mood changes.   Objective  Blood pressure 118/74, pulse 87, height 5\' 6"  (1.676 m), weight 103 lb (46.7 kg), SpO2 98 %.   General: No apparent distress alert and oriented x3 mood and affect normal, dressed appropriately.  HEENT: Pupils equal, extraocular movements intact  Respiratory: Patient's speak in full sentences and does not appear short of breath  Cardiovascular: No lower extremity edema, non tender, no erythema  Skin: Warm dry intact with no signs of infection or rash on extremities or on axial skeleton.  Abdomen: Soft nontender  Neuro: Cranial nerves II through XII are intact, neurovascularly intact in all extremities with 2+ DTRs and 2+ pulses.  Lymph: No lymphadenopathy of posterior or anterior cervical chain or axillae bilaterally.  Gait normal with good balance and coordination.  MSK:  Non tender with full range of motion and good stability and symmetric strength and tone of shoulders, elbows, wrist, hip, knee and ankles bilaterally.  Arthritic changes Back Exam:  Inspection: Loss of lordosis Motion: Flexion 45 deg, Extension 25 deg, Side Bending to 35 deg bilaterally,  Rotation to 35 deg bilaterally  SLR laying: Negative  XSLR laying: Negative  Palpable tenderness: Increased tenderness over the left piriformis. FABER: Positive left. Sensory change: Gross sensation intact  to all lumbar and sacral dermatomes.  Reflexes: 2+ at both patellar tendons, 2+ at achilles tendons, Babinski's downgoing.  Strength at foot  Plantar-flexion: 5/5 Dorsi-flexion: 5/5 Eversion: 5/5 Inversion: 5/5  Leg strength  Quad: 5/5 Hamstring: 5/5 Hip flexor: 5/5 Hip abductors: 5/5  Gait unremarkable.   Procedure: Real-time Ultrasound Guided Injection of left piriformis tendon sheath Device: GE Logiq Q7 Ultrasound guided injection is preferred based studies that show increased duration, increased effect, greater accuracy, decreased procedural pain, increased response rate, and decreased cost with ultrasound guided versus blind injection.  Verbal informed consent obtained.  Time-out conducted.  Noted no overlying erythema, induration, or other signs of local infection.  Skin prepped in a sterile fashion.  Local anesthesia: Topical Ethyl chloride.  With sterile technique and under real time ultrasound guidance: With a 21-gauge 2 inch needle was injected with 0.5 cc of 0.5% Marcaine and 1 cc of Kenalog 40 mg/mL into the left piriformis tendon sheath. Completed without difficulty  Pain immediately resolved suggesting accurate placement of the medication.  Advised to call if fevers/chills, erythema, induration, drainage, or persistent bleeding.  Images permanently stored and available for review in the ultrasound unit.  Impression: Technically successful ultrasound guided injection.   Impression and Recommendations:     This case required medical decision making of moderate complexity. The above documentation has been reviewed and is accurate and complete [name], DO       Note: This dictation was prepared with Dragon dictation along with smaller phrase technology. Any transcriptional errors that result from this process are unintentional.

## 2018-08-24 NOTE — Patient Instructions (Addendum)
Good to see you  Tracey [name] is your friend.  Injected the piriformis today  I really hope this helps PT will be calling you  See me again in 3 weeks Read about PT

## 2018-08-24 NOTE — Assessment & Plan Note (Signed)
Decision today to treat with OMT was based on Physical Exam  After verbal consent patient was treated with HVLA, ME, FPR techniques in cervical, thoracic, rib,  lumbar and sacral areas  Patient tolerated the procedure well with improvement in symptoms  Patient given exercises, stretches and lifestyle modifications  See medications in patient instructions if given  Patient will follow up in 4-8 weeks 

## 2018-08-24 NOTE — Assessment & Plan Note (Signed)
Injected today.  Tolerated the procedure well.  Patient did have some resolution of pain.  This will help [place] with the diagnosis of possible lumbar radiculopathy/impingement of the sciatic nerve through the piriformis.  Discussed icing regimen and home exercise.  Worsening symptoms may need advanced imaging.

## 2018-09-06 DIAGNOSIS — M79605 Pain in left leg: Secondary | ICD-10-CM | POA: Diagnosis not present

## 2018-09-08 MED FILL — GABAPENTIN 400 MG CAPSULE: 400 | 90 days supply | Qty: 270 | Fill #1

## 2018-09-13 DIAGNOSIS — M79605 Pain in left leg: Secondary | ICD-10-CM | POA: Diagnosis not present

## 2018-09-13 MED FILL — ESTRADIOL 0.1 MG/GM CRM: 0.1 | 90 days supply | Qty: 43 | Fill #0

## 2018-09-14 NOTE — Progress Notes (Signed)
[name] [place], [place] [address] Phone: [phone] Subjective:    I [name] am serving as a Education administrator for Dr. Hulan Saas.    CC: Left hip pain  [id number]  Tracey [name] is a 67 y.o. female coming in with complaint of left hip pain. States that her hips is doing somewhat better. Injection did not help.  Patient did have a piriformis injection.  Discussed icing regimen and home exercises.  Patient states that the pain is unrelenting.  Has been doing physical therapy.  Going down her leg again, the lateral aspect of the calf.  Has not noticed weakness but sometimes the pain is waking her up at night.     Past Medical History:  Diagnosis Date  . Closed hip fracture ([facility]) 03/2016   RT HIP  . Hypertension   . Osteopenia   . Osteoporosis    h/o  . Skin cancer    multiple skin cancers   Past Surgical History:  Procedure Laterality Date  . CALDWELL LUC    . COLONOSCOPY WITH PROPOFOL N/A 03/22/2014   Procedure: COLONOSCOPY WITH PROPOFOL;  Surgeon: Arta Silence, MD;  [facility];  Service: Endoscopy;  Laterality: N/A;  . HIP PINNING,CANNULATED Right 03/21/2016   Procedure: CANNULATED HIP PINNING;  Surgeon: Rod Can, MD;  Location: [facility];  Service: Orthopedics;  Laterality: Right;  . NASAL SEPTUM SURGERY     past hx. deviated septum  . TONSILLECTOMY     Social History   Socioeconomic History  . Marital status: Married    Spouse name: [name]  . Number of children: 1  . Years of education: Not on file  . Highest education level: Not on file  Occupational History  . Occupation: MED TECH    Employer: [employer]  Social Needs  . Financial resource strain: Not on file  . Food insecurity:    Worry: Not on file    Inability: Not on file  . Transportation needs:    Medical: Not on file    Non-medical: Not on file  Tobacco Use  . Smoking status: Never Smoker  . Smokeless tobacco: Never Used    Substance and Sexual Activity  . Alcohol use: Yes    Alcohol/week: 0.3 standard drinks  . Drug use: No  . Sexual activity: Yes    Partners: Male    Comment: married, [place]  Lifestyle  . Physical activity:    Days per week: Not on file    Minutes per session: Not on file  . Stress: Not on file  Relationships  . Social connections:    Talks on phone: Not on file    Gets together: Not on file    Attends religious service: Not on file    Active member of club or organization: Not on file    Attends meetings of clubs or organizations: Not on file    Relationship status: Not on file  Other Topics Concern  . Not on file  Social History Narrative  . Not on file   Allergies  Allergen Reactions  . Cefaclor     REACTION: urticaria (hives)  . Neomycin-Bacitracin Zn-Polymyx     REACTION: rash   Family History  Problem Relation Age of Onset  . Hypertension Mother   . Leukemia Father   . Hypertension Father   . Diabetes Brother   . Hypertension Brother   . Hyperlipidemia Maternal Grandmother   . Cancer  Maternal Grandfather   . Heart disease Paternal Grandfather   . Stroke Paternal Grandfather     Current Outpatient Medications (Endocrine & Metabolic):  .  predniSONE (DELTASONE) 50 MG tablet, Take one tablet a day for the next 5 days.  Current Outpatient Medications (Cardiovascular):  .  amLODipine (NORVASC) 5 MG tablet, Take 5 mg by mouth every evening.   Current Outpatient Medications (Analgesics):  .  aspirin 81 MG tablet, Take 81 mg by mouth daily.  Current Outpatient Medications (Hematological):  .  folic acid (FOLVITE) 1 MG tablet, Take 1 mg by mouth daily.    Current Outpatient Medications (Other):  Marland Kitchen  Ascorbic Acid (VITAMIN C PO), Take 1 tablet by mouth daily. Marland Kitchen  CALCIUM PO, Take 1 tablet by mouth daily. .  Cholecalciferol (VITAMIN D PO), Take 1 tablet by mouth daily. .  cyclobenzaprine (FLEXERIL) 10 MG tablet, Take 0.5 tablets (5 mg total) by mouth at  bedtime. Marland Kitchen  ESTRACE VAGINAL 0.1 MG/GM vaginal cream,  .  fish oil-omega-3 fatty acids 1000 MG capsule, Take 1 g by mouth 2 (two) times daily.  Marland Kitchen  gabapentin (NEURONTIN) 400 MG capsule, Take 400 mg by mouth at bedtime. .  Multiple Vitamin (MULTIVITAMIN WITH MINERALS) TABS tablet, Take 1 tablet by mouth daily.    Past medical history, social, surgical and family history all reviewed in electronic medical record.  No pertanent information unless stated regarding to the chief complaint.   Review of Systems:  No headache, visual changes, nausea, vomiting, diarrhea, constipation, dizziness, abdominal pain, skin rash, fevers, chills, night sweats, weight loss, swollen lymph nodes, body aches, joint swelling, , chest pain, shortness of breath, mood changes.  Positive muscle aches  Objective  Blood pressure 130/80, pulse (!) 46, height 5\' 6"  (1.676 m), weight 108 lb (49 kg), SpO2 95 %.   General: No apparent distress alert and oriented x3 mood and affect normal, dressed appropriately.  HEENT: Pupils equal, extraocular movements intact  Respiratory: Patient's speak in full sentences and does not appear short of breath  Cardiovascular: No lower extremity edema, non tender, no erythema  Skin: Warm dry intact with no signs of infection or rash on extremities or on axial skeleton.  Abdomen: Soft nontender  Neuro: Cranial nerves II through XII are intact, neurovascularly intact in all extremities with 2+ DTRs and 2+ pulses.  Lymph: No lymphadenopathy of posterior or anterior cervical chain or axillae bilaterally.  Gait normal with good balance and coordination.  MSK:  tender with full range of motion and good stability and symmetric strength and tone of shoulders, elbows, wrist, hip, knee and ankles bilaterally.  Arthritic changes Patient's lumbar spine does have some degenerative scoliosis and loss of lordosis.  Positive straight leg test.  But the patient also has pain over the ischial bursa.  Severely.   4 out of 5 strength.      Impression and Recommendations:     This case required medical decision making of moderate complexity. The above documentation has been reviewed and is accurate and complete [name], DO       Note: This dictation was prepared with Dragon dictation along with smaller phrase technology. Any transcriptional errors that result from this process are unintentional.

## 2018-09-16 ENCOUNTER — Ambulatory Visit: Payer: PPO | Admitting: Family Medicine

## 2018-09-16 ENCOUNTER — Encounter: Payer: Self-pay | Admitting: Family Medicine

## 2018-09-16 VITALS — BP 130/80 | HR 46 | Ht 66.0 in | Wt 108.0 lb

## 2018-09-16 DIAGNOSIS — G5701 Lesion of sciatic nerve, right lower limb: Secondary | ICD-10-CM | POA: Diagnosis not present

## 2018-09-16 DIAGNOSIS — M5416 Radiculopathy, lumbar region: Secondary | ICD-10-CM | POA: Diagnosis not present

## 2018-09-16 NOTE — Assessment & Plan Note (Signed)
Lumbar radiculopathy.  Discussed with patient in great length.  Discussed icing regimen and home exercises.  Discussed which activities to do which was to avoid.  Increase activity slowly over the course the next several days.  I do believe that with patient having some weakness we will get an MRI.  Patient failed injections of the piriformis and patient is having more of a straight leg and some mild weakness now.  At the same time patient seems to be severely tender over the ischial tuberosity and I feel with this pain going on for so long and the intensity of an MRI pelvis to rule out a ischial tuberosity fracture versus proximal hamstring tear could be beneficial.  This could change her medical management.  Patient will follow-up after the imaging and will discuss.  Spent  25 minutes with patient face-to-face and had greater than 50% of counseling including as described above in assessment and plan.

## 2018-09-16 NOTE — Patient Instructions (Signed)
Good to see you  Ice is your friend I want an mri of your back and pelvis Stay active I will write you with results and discuss next step  Sorry it has been so long

## 2018-09-20 DIAGNOSIS — M79605 Pain in left leg: Secondary | ICD-10-CM | POA: Diagnosis not present

## 2018-09-21 ENCOUNTER — Ambulatory Visit
Admission: RE | Admit: 2018-09-21 | Discharge: 2018-09-21 | Disposition: A | Discharge status: Home / Self Care | Payer: PPO | Source: Ambulatory Visit | Attending: Obstetrics and Gynecology | Admitting: Obstetrics and Gynecology

## 2018-09-21 ENCOUNTER — Encounter: Payer: Self-pay | Admitting: Family Medicine

## 2018-09-21 DIAGNOSIS — Z1231 Encounter for screening mammogram for malignant neoplasm of breast: Secondary | ICD-10-CM | POA: Diagnosis not present

## 2018-09-22 MED ORDER — TRAMADOL HCL 50 MG PO TABS: 50.0000 mg | ORAL_TABLET | Freq: Two times a day (BID) | ORAL | 0 refills | Status: DC | PRN

## 2018-09-22 MED FILL — traMADol HCL 50 MG TABS: 50 | 5 days supply | Qty: 10 | Fill #0

## 2018-09-25 ENCOUNTER — Ambulatory Visit
Admission: RE | Admit: 2018-09-25 | Discharge: 2018-09-25 | Disposition: A | Discharge status: Home / Self Care | Payer: PPO | Source: Ambulatory Visit | Attending: Family Medicine | Admitting: Family Medicine

## 2018-09-25 DIAGNOSIS — M5416 Radiculopathy, lumbar region: Secondary | ICD-10-CM

## 2018-09-25 DIAGNOSIS — M160 Bilateral primary osteoarthritis of hip: Secondary | ICD-10-CM | POA: Diagnosis not present

## 2018-09-25 DIAGNOSIS — G5701 Lesion of sciatic nerve, right lower limb: Secondary | ICD-10-CM

## 2018-09-26 ENCOUNTER — Encounter: Payer: Self-pay | Admitting: Family Medicine

## 2018-09-27 NOTE — Telephone Encounter (Unsigned)
Copied from [place] [id number]. Topic: General - Other >> Sep 27, 2018 10:49 AM Yvette Rack wrote: Reason for CRM: Pt called in to discuss MRI results and the follow up plan. Pt request call back. Cb# [phone]

## 2018-09-28 DIAGNOSIS — M79605 Pain in left leg: Secondary | ICD-10-CM | POA: Diagnosis not present

## 2018-09-30 ENCOUNTER — Encounter: Payer: Self-pay | Admitting: Family Medicine

## 2018-09-30 DIAGNOSIS — M5416 Radiculopathy, lumbar region: Secondary | ICD-10-CM

## 2018-10-05 ENCOUNTER — Ambulatory Visit
Admission: RE | Admit: 2018-10-05 | Discharge: 2018-10-05 | Disposition: A | Discharge status: Home / Self Care | Payer: PPO | Source: Ambulatory Visit | Attending: Family Medicine | Admitting: Family Medicine

## 2018-10-05 DIAGNOSIS — M47817 Spondylosis without myelopathy or radiculopathy, lumbosacral region: Secondary | ICD-10-CM | POA: Diagnosis not present

## 2018-10-05 DIAGNOSIS — N857 Hematometra: Secondary | ICD-10-CM | POA: Diagnosis not present

## 2018-10-05 DIAGNOSIS — M5416 Radiculopathy, lumbar region: Secondary | ICD-10-CM

## 2018-10-05 MED ORDER — METHYLPREDNISOLONE ACETATE 40 MG/ML INJ SUSP (RADIOLOG
120.0000 mg | Freq: Once | INTRAMUSCULAR | Status: AC
  Administered 2018-10-05: 120 mg via EPIDURAL

## 2018-10-05 MED ORDER — IOPAMIDOL (ISOVUE-M 200) INJECTION 41%
1.0000 mL | Freq: Once | INTRAMUSCULAR | Status: AC
  Administered 2018-10-05: 1 mL via EPIDURAL

## 2018-10-05 NOTE — Discharge Instructions (Signed)

## 2018-10-07 ENCOUNTER — Other Ambulatory Visit: Payer: Self-pay | Admitting: Obstetrics and Gynecology

## 2018-10-07 DIAGNOSIS — N882 Stricture and stenosis of cervix uteri: Secondary | ICD-10-CM | POA: Diagnosis not present

## 2018-10-07 DIAGNOSIS — N857 Hematometra: Secondary | ICD-10-CM | POA: Diagnosis not present

## 2018-10-07 MED FILL — FOLIC ACID 1 MG TABS: 1 | 90 days supply | Qty: 90 | Fill #0

## 2018-10-07 MED FILL — AMLODIPINE BESYLATE 5 MG TA: 5 | 90 days supply | Qty: 90 | Fill #0

## 2018-10-08 DIAGNOSIS — D1801 Hemangioma of skin and subcutaneous tissue: Secondary | ICD-10-CM | POA: Diagnosis not present

## 2018-10-08 DIAGNOSIS — L910 Hypertrophic scar: Secondary | ICD-10-CM | POA: Diagnosis not present

## 2018-10-08 DIAGNOSIS — Z85828 Personal history of other malignant neoplasm of skin: Secondary | ICD-10-CM | POA: Diagnosis not present

## 2018-10-08 DIAGNOSIS — D225 Melanocytic nevi of trunk: Secondary | ICD-10-CM | POA: Diagnosis not present

## 2018-10-08 DIAGNOSIS — Z85820 Personal history of malignant melanoma of skin: Secondary | ICD-10-CM | POA: Diagnosis not present

## 2018-10-08 DIAGNOSIS — L570 Actinic keratosis: Secondary | ICD-10-CM | POA: Diagnosis not present

## 2018-10-08 DIAGNOSIS — L817 Pigmented purpuric dermatosis: Secondary | ICD-10-CM | POA: Diagnosis not present

## 2018-10-08 DIAGNOSIS — D2271 Melanocytic nevi of right lower limb, including hip: Secondary | ICD-10-CM | POA: Diagnosis not present

## 2018-10-08 DIAGNOSIS — L821 Other seborrheic keratosis: Secondary | ICD-10-CM | POA: Diagnosis not present

## 2018-10-11 ENCOUNTER — Other Ambulatory Visit: Payer: PPO

## 2018-10-15 DIAGNOSIS — M79605 Pain in left leg: Secondary | ICD-10-CM | POA: Diagnosis not present

## 2018-10-25 DIAGNOSIS — M79605 Pain in left leg: Secondary | ICD-10-CM | POA: Diagnosis not present

## 2018-11-02 ENCOUNTER — Encounter: Payer: Self-pay | Admitting: Family Medicine

## 2018-11-02 ENCOUNTER — Encounter

## 2018-11-02 ENCOUNTER — Ambulatory Visit: Payer: PPO | Admitting: Family Medicine

## 2018-11-02 VITALS — BP 128/82 | HR 62 | Ht 66.0 in | Wt 109.0 lb

## 2018-11-02 DIAGNOSIS — M5416 Radiculopathy, lumbar region: Secondary | ICD-10-CM

## 2018-11-02 DIAGNOSIS — M999 Biomechanical lesion, unspecified: Secondary | ICD-10-CM | POA: Diagnosis not present

## 2018-11-02 NOTE — Patient Instructions (Signed)
Good to see you  I am glad you are doing better  Ice is your friend OK to go to gym but 2 days a week for 2 weeks then 3 times a week for 4 weeks then ok to do what you want See me again in 4-6 weeks

## 2018-11-02 NOTE — Assessment & Plan Note (Signed)
Patient has significant lumbar radiculopathy.  Patient though has improved after the epidural.  Hopefully will continue to improve.  Patient restarted osteopathic manipulation but will do more of a low impact.  Discussed icing regimen.  Follow-up again in 4 to 6 weeks

## 2018-11-02 NOTE — Assessment & Plan Note (Signed)
Decision today to treat with OMT was based on Physical Exam  After verbal consent patient was treated with ME, FPR techniques in cervical, thoracic, rib lumbar and sacral areas  Patient tolerated the procedure well with improvement in symptoms  Patient given exercises, stretches and lifestyle modifications  See medications in patient instructions if given  Patient will follow up in 4-6 weeks

## 2018-11-02 NOTE — Progress Notes (Signed)
[name] [place], [place] [address] Phone: [phone] Subjective:      CC: Back and hip pain follow-up  [id number]  Tracey [name] is a 67 y.o. female coming in with complaint of hip pain. She said that she is having some aching type pain with prolonged sitting. She feels like she has improved but the pain is much better. Injection did help.   Patient was found to have degenerative disc disease especially at L4-L5 and L5-S1.  Right L5 nerve root and S1 radiculitis likely.  Patient given epidural injection October 05, 2018 at L5-S1.    Past Medical History:  Diagnosis Date  . Closed hip fracture ([facility]) 03/2016   RT HIP  . Hypertension   . Osteopenia   . Osteoporosis    h/o  . Skin cancer    multiple skin cancers   Past Surgical History:  Procedure Laterality Date  . CALDWELL LUC    . COLONOSCOPY WITH PROPOFOL N/A 03/22/2014   Procedure: COLONOSCOPY WITH PROPOFOL;  Surgeon: Arta Silence, MD;  [facility];  Service: Endoscopy;  Laterality: N/A;  . HIP PINNING,CANNULATED Right 03/21/2016   Procedure: CANNULATED HIP PINNING;  Surgeon: Rod Can, MD;  Location: [facility];  Service: Orthopedics;  Laterality: Right;  . NASAL SEPTUM SURGERY     past hx. deviated septum  . TONSILLECTOMY     Social History   Socioeconomic History  . Marital status: Married    Spouse name: [name]  . Number of children: 1  . Years of education: Not on file  . Highest education level: Not on file  Occupational History  . Occupation: MED TECH    Employer: [employer]  Social Needs  . Financial resource strain: Not on file  . Food insecurity:    Worry: Not on file    Inability: Not on file  . Transportation needs:    Medical: Not on file    Non-medical: Not on file  Tobacco Use  . Smoking status: Never Smoker  . Smokeless tobacco: Never Used  Substance and Sexual Activity  . Alcohol use: Yes    Alcohol/week: 0.3 standard  drinks  . Drug use: No  . Sexual activity: Yes    Partners: Male    Comment: married, [place]  Lifestyle  . Physical activity:    Days per week: Not on file    Minutes per session: Not on file  . Stress: Not on file  Relationships  . Social connections:    Talks on phone: Not on file    Gets together: Not on file    Attends religious service: Not on file    Active member of club or organization: Not on file    Attends meetings of clubs or organizations: Not on file    Relationship status: Not on file  Other Topics Concern  . Not on file  Social History Narrative  . Not on file   Allergies  Allergen Reactions  . Cefaclor     REACTION: urticaria (hives)  . Neomycin-Bacitracin Zn-Polymyx     REACTION: rash   Family History  Problem Relation Age of Onset  . Hypertension Mother   . Leukemia Father   . Hypertension Father   . Diabetes Brother   . Hypertension Brother   . Hyperlipidemia Maternal Grandmother   . Cancer Maternal Grandfather   . Heart disease Paternal Grandfather   . Stroke Paternal Grandfather   . Breast  cancer Maternal Aunt   . Breast cancer Paternal Aunt     Current Outpatient Medications (Endocrine & Metabolic):  .  predniSONE (DELTASONE) 50 MG tablet, Take one tablet a day for the next 5 days.  Current Outpatient Medications (Cardiovascular):  .  amLODipine (NORVASC) 5 MG tablet, Take 5 mg by mouth every evening.   Current Outpatient Medications (Analgesics):  .  aspirin 81 MG tablet, Take 81 mg by mouth daily. .  traMADol (ULTRAM) 50 MG tablet, Take 1 tablet (50 mg total) by mouth every 12 (twelve) hours as needed.  Current Outpatient Medications (Hematological):  .  folic acid (FOLVITE) 1 MG tablet, Take 1 mg by mouth daily.    Current Outpatient Medications (Other):  Marland Kitchen  Ascorbic Acid (VITAMIN C PO), Take 1 tablet by mouth daily. Marland Kitchen  CALCIUM PO, Take 1 tablet by mouth daily. .  Cholecalciferol (VITAMIN D PO), Take 1 tablet by mouth  daily. .  cyclobenzaprine (FLEXERIL) 10 MG tablet, Take 0.5 tablets (5 mg total) by mouth at bedtime. Marland Kitchen  ESTRACE VAGINAL 0.1 MG/GM vaginal cream,  .  fish oil-omega-3 fatty acids 1000 MG capsule, Take 1 g by mouth 2 (two) times daily.  Marland Kitchen  gabapentin (NEURONTIN) 400 MG capsule, Take 400 mg by mouth at bedtime. .  Multiple Vitamin (MULTIVITAMIN WITH MINERALS) TABS tablet, Take 1 tablet by mouth daily.    Past medical history, social, surgical and family history all reviewed in electronic medical record.  No pertanent information unless stated regarding to the chief complaint.   Review of Systems:  No headache, visual changes, nausea, vomiting, diarrhea, constipation, dizziness, abdominal pain, skin rash, fevers, chills, night sweats, weight loss, swollen lymph nodes, body aches, joint swelling, chest pain, shortness of breath, mood changes.  Positive muscle aches  Objective  Blood pressure 128/82, pulse 62, height 5\' 6"  (1.676 m), weight 109 lb (49.4 kg), SpO2 98 %.    General: No apparent distress alert and oriented x3 mood and affect normal, dressed appropriately.  HEENT: Pupils equal, extraocular movements intact  Respiratory: Patient's speak in full sentences and does not appear short of breath  Cardiovascular: No lower extremity edema, non tender, no erythema  Skin: Warm dry intact with no signs of infection or rash on extremities or on axial skeleton.  Abdomen: Soft nontender  Neuro: Cranial nerves II through XII are intact, neurovascularly intact in all extremities with 2+ DTRs and 2+ pulses.  Lymph: No lymphadenopathy of posterior or anterior cervical chain or axillae bilaterally.  Gait normal with good balance and coordination.  MSK:  tender with full range of motion and good stability and symmetric strength and tone of shoulders, elbows, wrist, hip, knee and ankles bilaterally.  Arthritic changes multiple joints Back Exam:  Inspection: Loss of lordosis Motion: Flexion 45 deg,  Extension 25 deg, Side Bending to 35 deg bilaterally, Rotation to 35 deg bilaterally  SLR laying: Negative tight hamstrings bilaterally XSLR laying: Negative  Palpable tenderness: Tender to palpation the paraspinal musculature lumbar spine right greater than left. FABER: Tightness bilaterally. Sensory change: Gross sensation intact to all lumbar and sacral dermatomes.  Reflexes: 2+ at both patellar tendons, 2+ at achilles tendons, Babinski's downgoing.  Strength at foot  Plantar-flexion: 5/5 Dorsi-flexion: 5/5 Eversion: 5/5 Inversion: 5/5  Leg strength  Quad: 5/5 Hamstring: 5/5 Hip flexor: 5/5 Hip abductors: 5/5  Gait unremarkable.   Osteopathic findings C6 flexed rotated and side bent left T3 extended rotated and side bent right inhaled third rib T7  extended rotated and side bent left L2 flexed rotated and side bent right Sacrum right on right     Impression and Recommendations:     This case required medical decision making of moderate complexity. The above documentation has been reviewed and is accurate and complete [name], DO       Note: This dictation was prepared with Dragon dictation along with smaller phrase technology. Any transcriptional errors that result from this process are unintentional.

## 2018-11-29 DIAGNOSIS — H26493 Other secondary cataract, bilateral: Secondary | ICD-10-CM | POA: Diagnosis not present

## 2018-11-29 MED FILL — ESTRADIOL 0.1 MG/GM CREA: 0.1 | 90 days supply | Qty: 43 | Fill #1

## 2018-12-06 NOTE — Progress Notes (Signed)
[name] [address] [place], [place] [address] Phone: [phone] Subjective:    I [name] am serving as a Education administrator for Dr. Hulan Saas.    CC: Back pain follow-up  [id number]  Tracey [name] is a 68 y.o. female coming in with complaint of back pain. States she is feeling about the same.  Patient was found to have a nerve root impingement and was given a left-sided L5-S1 epidural back in November.  Was 100% better for approximately 6 weeks and now starting to have some worsening pain again.  Patient is concerned because she will be traveling on a cruise in 3 weeks.  Feels like the pain is starting to worsen again.      Past Medical History:  Diagnosis Date  . Closed hip fracture ([place]) 03/2016   RT HIP  . Hypertension   . Osteopenia   . Osteoporosis    h/o  . Skin cancer    multiple skin cancers   Past Surgical History:  Procedure Laterality Date  . CALDWELL LUC    . COLONOSCOPY WITH PROPOFOL N/A 03/22/2014   Procedure: COLONOSCOPY WITH PROPOFOL;  Surgeon: Arta Silence, MD;  Location: [facility];  Service: Endoscopy;  Laterality: N/A;  . HIP PINNING,CANNULATED Right 03/21/2016   Procedure: CANNULATED HIP PINNING;  Surgeon: Rod Can, MD;  Location: [facility];  Service: Orthopedics;  Laterality: Right;  . NASAL SEPTUM SURGERY     past hx. deviated septum  . TONSILLECTOMY     Social History   Socioeconomic History  . Marital status: Married    Spouse name: [name]  . Number of children: 1  . Years of education: Not on file  . Highest education level: Not on file  Occupational History  . Occupation: MED TECH    Employer: [employer]  Social Needs  . Financial resource strain: Not on file  . Food insecurity:    Worry: Not on file    Inability: Not on file  . Transportation needs:    Medical: Not on file    Non-medical: Not on file  Tobacco Use  . Smoking status: Never Smoker  . Smokeless tobacco: Never Used    Substance and Sexual Activity  . Alcohol use: Yes    Alcohol/week: 0.3 standard drinks  . Drug use: No  . Sexual activity: Yes    Partners: Male    Comment: married, [place]  Lifestyle  . Physical activity:    Days per week: Not on file    Minutes per session: Not on file  . Stress: Not on file  Relationships  . Social connections:    Talks on phone: Not on file    Gets together: Not on file    Attends religious service: Not on file    Active member of club or organization: Not on file    Attends meetings of clubs or organizations: Not on file    Relationship status: Not on file  Other Topics Concern  . Not on file  Social History Narrative  . Not on file   Allergies  Allergen Reactions  . Cefaclor     REACTION: urticaria (hives)  . Neomycin-Bacitracin Zn-Polymyx     REACTION: rash   Family History  Problem Relation Age of Onset  . Hypertension Mother   . Leukemia Father   . Hypertension Father   . Diabetes Brother   . Hypertension Brother   . Hyperlipidemia Maternal Grandmother   .  Cancer Maternal Grandfather   . Heart disease Paternal Grandfather   . Stroke Paternal Grandfather   . Breast cancer Maternal Aunt   . Breast cancer Paternal Aunt     Current Outpatient Medications (Endocrine & Metabolic):  .  predniSONE (DELTASONE) 50 MG tablet, Take one tablet a day for the next 5 days.  Current Outpatient Medications (Cardiovascular):  .  amLODipine (NORVASC) 5 MG tablet, Take 5 mg by mouth every evening.   Current Outpatient Medications (Analgesics):  .  aspirin 81 MG tablet, Take 81 mg by mouth daily. .  traMADol (ULTRAM) 50 MG tablet, Take 1 tablet (50 mg total) by mouth every 12 (twelve) hours as needed.  Current Outpatient Medications (Hematological):  .  folic acid (FOLVITE) 1 MG tablet, Take 1 mg by mouth daily.    Current Outpatient Medications (Other):  Marland Kitchen  Ascorbic Acid (VITAMIN C PO), Take 1 tablet by mouth daily. Marland Kitchen  CALCIUM PO, Take 1 tablet  by mouth daily. .  Cholecalciferol (VITAMIN D PO), Take 1 tablet by mouth daily. .  cyclobenzaprine (FLEXERIL) 10 MG tablet, Take 0.5 tablets (5 mg total) by mouth at bedtime. Marland Kitchen  ESTRACE VAGINAL 0.1 MG/GM vaginal cream,  .  fish oil-omega-3 fatty acids 1000 MG capsule, Take 1 g by mouth 2 (two) times daily.  Marland Kitchen  gabapentin (NEURONTIN) 400 MG capsule, Take 400 mg by mouth at bedtime. .  Multiple Vitamin (MULTIVITAMIN WITH MINERALS) TABS tablet, Take 1 tablet by mouth daily.    Past medical history, social, surgical and family history all reviewed in electronic medical record.  No pertanent information unless stated regarding to the chief complaint.   Review of Systems:  No headache, visual changes, nausea, vomiting, diarrhea, constipation, dizziness, abdominal pain, skin rash, fevers, chills, night sweats, weight loss, swollen lymph nodes, body aches, joint swelling,  chest pain, shortness of breath, mood changes.  Positive muscle aches  Objective  Blood pressure 122/80, pulse 87, height 5\' 6"  (1.676 m), weight 109 lb (49.4 kg), SpO2 97 %.    General: No apparent distress alert and oriented x3 mood and affect normal, dressed appropriately.  HEENT: Pupils equal, extraocular movements intact  Respiratory: Patient's speak in full sentences and does not appear short of breath  Cardiovascular: No lower extremity edema, non tender, no erythema  Skin: Warm dry intact with no signs of infection or rash on extremities or on axial skeleton.  Abdomen: Soft nontender  Neuro: Cranial nerves II through XII are intact, neurovascularly intact in all extremities with 2+ DTRs and 2+ pulses.  Lymph: No lymphadenopathy of posterior or anterior cervical chain or axillae bilaterally.  Gait normal with good balance and coordination.  MSK:  Non tender with full range of motion and good stability and symmetric strength and tone of shoulders, elbows, wrist, hip, knee and ankles bilaterally.  Very mild arthritic  changes. Back exam shows the patient does have loss of lordosis with some degenerative scoliosis.  Patient does have a mild positive straight leg test on the left side.  Positive Faber test.  Severe pain around the left sacroiliac joint as well as in the left piriformis.  Neurovascularly intact distally with full strength in lower extremities    Osteopathic findings C6 flexed rotated and side bent left T3 extended rotated and side bent right inhaled third rib T9 extended rotated and side bent left L2 flexed rotated and side bent right Sacrum right on right  Impression and Recommendations:     This case  required medical decision making of moderate complexity. The above documentation has been reviewed and is accurate and complete [name], DO       Note: This dictation was prepared with Dragon dictation along with smaller phrase technology. Any transcriptional errors that result from this process are unintentional.

## 2018-12-07 ENCOUNTER — Encounter: Payer: Self-pay | Admitting: Family Medicine

## 2018-12-07 ENCOUNTER — Ambulatory Visit: Payer: PPO | Admitting: Family Medicine

## 2018-12-07 VITALS — BP 122/80 | HR 87 | Ht 66.0 in | Wt 109.0 lb

## 2018-12-07 DIAGNOSIS — M5416 Radiculopathy, lumbar region: Secondary | ICD-10-CM

## 2018-12-07 DIAGNOSIS — M999 Biomechanical lesion, unspecified: Secondary | ICD-10-CM

## 2018-12-07 NOTE — Assessment & Plan Note (Signed)
Mild worsening symptoms at this point.  Discussed icing regimen and home exercise.  Discussed topical anti-inflammatories.  Patient has responded previously to injections and we will order another epidural at this time.  Hope patient will respond just as well in for longer duration.  Attempted osteopathic manipulation again with more low velocity maneuvers.  Follow-up again in 4 to 8 weeks

## 2018-12-07 NOTE — Assessment & Plan Note (Signed)
Decision today to treat with OMT was based on Physical Exam  After verbal consent patient was treated with  ME, FPR techniques in cervical, thoracic, rib, lumbar and sacral areas  Patient tolerated the procedure well with improvement in symptoms  Patient given exercises, stretches and lifestyle modifications  See medications in patient instructions if given  Patient will follow up in 4-8 weeks 

## 2018-12-13 ENCOUNTER — Inpatient Hospital Stay: Admission: RE | Admit: 2018-12-13 | Payer: PPO | Source: Ambulatory Visit

## 2018-12-16 DIAGNOSIS — H26492 Other secondary cataract, left eye: Secondary | ICD-10-CM | POA: Diagnosis not present

## 2018-12-20 ENCOUNTER — Ambulatory Visit
Admission: RE | Admit: 2018-12-20 | Discharge: 2018-12-20 | Disposition: A | Discharge status: Home / Self Care | Payer: PPO | Source: Ambulatory Visit | Attending: Family Medicine | Admitting: Family Medicine

## 2018-12-20 DIAGNOSIS — M5137 Other intervertebral disc degeneration, lumbosacral region: Secondary | ICD-10-CM | POA: Diagnosis not present

## 2018-12-20 DIAGNOSIS — M5416 Radiculopathy, lumbar region: Secondary | ICD-10-CM

## 2018-12-20 MED ORDER — METHYLPREDNISOLONE ACETATE 40 MG/ML INJ SUSP (RADIOLOG
120.0000 mg | Freq: Once | INTRAMUSCULAR | Status: AC
  Administered 2018-12-20: 120 mg via EPIDURAL

## 2018-12-20 MED ORDER — IOPAMIDOL (ISOVUE-M 200) INJECTION 41%
1.0000 mL | Freq: Once | INTRAMUSCULAR | Status: AC
  Administered 2018-12-20: 1 mL via EPIDURAL

## 2018-12-28 ENCOUNTER — Ambulatory Visit: Payer: PPO | Admitting: Family Medicine

## 2018-12-29 ENCOUNTER — Ambulatory Visit: Payer: PPO | Admitting: Family Medicine

## 2018-12-29 ENCOUNTER — Encounter: Payer: Self-pay | Admitting: Family Medicine

## 2018-12-29 DIAGNOSIS — M533 Sacrococcygeal disorders, not elsewhere classified: Secondary | ICD-10-CM | POA: Diagnosis not present

## 2018-12-29 NOTE — Assessment & Plan Note (Signed)
Patient given injection.  Tolerated the procedure well.  Discussed icing regimen and home exercise.  Discussed which activities to do which was to avoid.  Patient is to monitor closely.  Discussed avoiding certain activities in the long run.  Patient will likely need more of a lumbar radiculopathy work-up.  Patient will likely need another epidural but patient will be leaving.  Has prednisone for breakthrough pain.  Follow-up again in 4 weeks

## 2018-12-29 NOTE — Patient Instructions (Signed)
Good to see you  Duexis 3 times a day fi needed for 6 days but hopefully you will do well  Tried an SI injection  When you get back write me and if worse then we will need to consider another epidural

## 2018-12-29 NOTE — Progress Notes (Signed)
[name] [place],  [address] Phone: [phone] Subjective:    I Tracey [name] am serving as a Education administrator for Dr. Hulan Saas.   CC: Left hip pain  [id number]  Tracey [name] is a 68 y.o. female coming in with complaint of back pain. The left hip is bothering her. States it is TTP.  Oxygenation overall.  Patient did respond fairly well to an epidural but would like to avoid that.  Attempted in the a.m. piriformis injection with no significant improvement.  Points more to the sacroiliac joint with radiation down the hamstring that seems to be giving her more problems.  Patient denies any fevers chills or any abnormal weight loss.      Past Medical History:  Diagnosis Date  . Closed hip fracture ([place]) 03/2016   RT HIP  . Hypertension   . Osteopenia   . Osteoporosis    h/o  . Skin cancer    multiple skin cancers   Past Surgical History:  Procedure Laterality Date  . CALDWELL LUC    . COLONOSCOPY WITH PROPOFOL N/A 03/22/2014   Procedure: COLONOSCOPY WITH PROPOFOL;  Surgeon: Arta Silence, MD;  [facility];  Service: Endoscopy;  Laterality: N/A;  . HIP PINNING,CANNULATED Right 03/21/2016   Procedure: CANNULATED HIP PINNING;  Surgeon: Rod Can, MD;  Location: [facility];  Service: Orthopedics;  Laterality: Right;  . NASAL SEPTUM SURGERY     past hx. deviated septum  . TONSILLECTOMY     Social History   Socioeconomic History  . Marital status: Married    Spouse name: Tracey [name]  . Number of children: 1  . Years of education: Not on file  . Highest education level: Not on file  Occupational History  . Occupation: MED TECH    Employer: [employer]  Social Needs  . Financial resource strain: Not on file  . Food insecurity:    Worry: Not on file    Inability: Not on file  . Transportation needs:    Medical: Not on file    Non-medical: Not on file  Tobacco Use  . Smoking status: Never Smoker  .  Smokeless tobacco: Never Used  Substance and Sexual Activity  . Alcohol use: Yes    Alcohol/week: 0.3 standard drinks  . Drug use: No  . Sexual activity: Yes    Partners: Male    Comment: married, [place]  Lifestyle  . Physical activity:    Days per week: Not on file    Minutes per session: Not on file  . Stress: Not on file  Relationships  . Social connections:    Talks on phone: Not on file    Gets together: Not on file    Attends religious service: Not on file    Active member of club or organization: Not on file    Attends meetings of clubs or organizations: Not on file    Relationship status: Not on file  Other Topics Concern  . Not on file  Social History Narrative  . Not on file   Allergies  Allergen Reactions  . Cefaclor     REACTION: urticaria (hives)  . Neomycin-Bacitracin Zn-Polymyx     REACTION: rash   Family History  Problem Relation Age of Onset  . Hypertension Mother   . Leukemia Father   . Hypertension Father   . Diabetes Brother   . Hypertension Brother   . Hyperlipidemia Maternal Grandmother   .  Cancer Maternal Grandfather   . Heart disease Paternal Grandfather   . Stroke Paternal Grandfather   . Breast cancer Maternal Aunt   . Breast cancer Paternal Aunt     Current Outpatient Medications (Endocrine & Metabolic):  .  predniSONE (DELTASONE) 50 MG tablet, Take one tablet a day for the next 5 days.  Current Outpatient Medications (Cardiovascular):  .  amLODipine (NORVASC) 5 MG tablet, Take 5 mg by mouth every evening.   Current Outpatient Medications (Analgesics):  .  aspirin 81 MG tablet, Take 81 mg by mouth daily. .  traMADol (ULTRAM) 50 MG tablet, Take 1 tablet (50 mg total) by mouth every 12 (twelve) hours as needed.  Current Outpatient Medications (Hematological):  .  folic acid (FOLVITE) 1 MG tablet, Take 1 mg by mouth daily.    Current Outpatient Medications (Other):  Marland Kitchen  Ascorbic Acid (VITAMIN C PO), Take 1 tablet by mouth  daily. Marland Kitchen  CALCIUM PO, Take 1 tablet by mouth daily. .  Cholecalciferol (VITAMIN D PO), Take 1 tablet by mouth daily. .  cyclobenzaprine (FLEXERIL) 10 MG tablet, Take 0.5 tablets (5 mg total) by mouth at bedtime. Marland Kitchen  ESTRACE VAGINAL 0.1 MG/GM vaginal cream,  .  fish oil-omega-3 fatty acids 1000 MG capsule, Take 1 g by mouth 2 (two) times daily.  Marland Kitchen  gabapentin (NEURONTIN) 400 MG capsule, Take 400 mg by mouth at bedtime. .  Multiple Vitamin (MULTIVITAMIN WITH MINERALS) TABS tablet, Take 1 tablet by mouth daily.    Past medical history, social, surgical and family history all reviewed in electronic medical record.  No pertanent information unless stated regarding to the chief complaint.   Review of Systems:  No headache, visual changes, nausea, vomiting, diarrhea, constipation, dizziness, abdominal pain, skin rash, fevers, chills, night sweats, weight loss, swollen lymph nodes,, chest pain, shortness of breath, mood changes.  Positive muscle aches, body aches  Objective  Blood pressure 140/90, pulse (!) 58, height 5\' 6"  (1.676 m), weight 109 lb (49.4 kg), SpO2 97 %. Systems examined below as of    General: No apparent distress alert and oriented x3 mood and affect normal, dressed appropriately.  HEENT: Pupils equal, extraocular movements intact  Respiratory: Patient's speak in full sentences and does not appear short of breath  Cardiovascular: No lower extremity edema, non tender, no erythema  Skin: Warm dry intact with no signs of infection or rash on extremities or on axial skeleton.  Abdomen: Soft nontender  Neuro: Cranial nerves II through XII are intact, neurovascularly intact in all extremities with 2+ DTRs and 2+ pulses.  Lymph: No lymphadenopathy of posterior or anterior cervical chain or axillae bilaterally.  Gait normal with good balance and coordination.  MSK:  tender with mild limited range of motion and good stability and symmetric strength and tone of shoulders, elbows, wrist,  hip, knee and ankles bilaterally.  Neck exam does have loss of lordosis.  Patient does have a positive Corky Sox on the left side.  Severe tenderness over the piriformis and moderate tenderness over the sacroiliac joint.  Positive Faber's with extension of the forearm significant tightness and increasing discomfort in the hamstring with straight leg test.  After verbal consent patient was prepped with alcohol swab and with a 21-gauge 2 inch needle injected into the left sacroiliac joint.  Patient tolerated the procedure well.  Total of 0.5 cc of 0.5% Marcaine and 0.5 cc of Kenalog 40 mg/mL used.  Tolerated procedure well with mild decrease in pain.  No blood  loss.  Postinjection instructions given    Impression and Recommendations:     This case required medical decision making of moderate complexity. The above documentation has been reviewed and is accurate and complete [name], DO       Note: This dictation was prepared with Dragon dictation along with smaller phrase technology. Any transcriptional errors that result from this process are unintentional.

## 2019-01-14 ENCOUNTER — Other Ambulatory Visit: Payer: Self-pay | Admitting: Family Medicine

## 2019-01-14 MED FILL — FOLIC ACID 1 MG TABS: 1 | 90 days supply | Qty: 90 | Fill #1

## 2019-01-14 MED FILL — AMLODIPINE BESYLATE 5 MG TA: 5 | 90 days supply | Qty: 90 | Fill #1

## 2019-01-17 MED FILL — CYCLOBENZAPRINE 10 MG TAB: 10 | 60 days supply | Qty: 30 | Fill #0

## 2019-01-19 ENCOUNTER — Ambulatory Visit: Payer: PPO | Admitting: Family Medicine

## 2019-01-19 VITALS — BP 120/86 | HR 88 | Ht 66.0 in | Wt 111.0 lb

## 2019-01-19 DIAGNOSIS — M549 Dorsalgia, unspecified: Secondary | ICD-10-CM

## 2019-01-19 DIAGNOSIS — M5416 Radiculopathy, lumbar region: Secondary | ICD-10-CM

## 2019-01-19 NOTE — Assessment & Plan Note (Addendum)
Discussed with patient in great length.  Patient is failed formal physical therapy, home exercise, osteopathic manipulation as well as to epidurals.  We also attempted a left-sided sacroiliac injection that helped only laterally as well as the piriformis injection with very minimal improvement.  Patient has only had some improvement from time to time but unfortunately quality of life seems to be worsening at this time.  Patient would like to discuss with neurosurgery about different treatment options including possible surgical intervention.  Patient will be referred today.  All questions were answered.  Continue same medications including the gabapentin at this time.  Patient can follow-up with me as needed.  Spent  25 minutes with patient face-to-face and had greater than 50% of counseling including as described above in assessment and plan.

## 2019-01-19 NOTE — Patient Instructions (Signed)
Good to see you  Neurosurgery should be good information  I think you should do well  No manipulation today with you being symmetric Have an appointment with me again in 4 weeks just in case

## 2019-01-19 NOTE — Progress Notes (Signed)
[name] [place], [place] [address] Phone: [phone] Subjective:    I Tracey [name] am serving as a Education administrator for Dr. Hulan Saas.   CC: Back pain follow-up  [id number]  Tracey [name] is a 68 y.o. female coming in with complaint of back pain. States that she is feeling about the same.  Patient does have a left-sided pain.  Patient feels a sharp pain in the sacroiliac joint is a better after the injection but continues to have the radicular symptoms.  Patient states it is unrelenting.  I have shown the did have a lateral recess with disc degeneration at L4-L5 and L5-S1 with a left S1 nerve root impingement that it corresponds to patient's symptoms.  This was independently visualized by me.  Has had 2 epidurals done at the L5 and S1 area with some mild to moderate improvement initially but then seems to come right back.  Failed all conservative therapy including conservative therapy such as formal physical therapy.       Past Medical History:  Diagnosis Date  . Closed hip fracture ([facility]) 03/2016   RT HIP  . Hypertension   . Osteopenia   . Osteoporosis    h/o  . Skin cancer    multiple skin cancers   Past Surgical History:  Procedure Laterality Date  . CALDWELL LUC    . COLONOSCOPY WITH PROPOFOL N/A 03/22/2014   Procedure: COLONOSCOPY WITH PROPOFOL;  Surgeon: Arta Silence, MD;  Location: [facility];  Service: Endoscopy;  Laterality: N/A;  . HIP PINNING,CANNULATED Right 03/21/2016   Procedure: CANNULATED HIP PINNING;  Surgeon: Rod Can, MD;  Location: [facility];  Service: Orthopedics;  Laterality: Right;  . NASAL SEPTUM SURGERY     past hx. deviated septum  . TONSILLECTOMY     Social History   Socioeconomic History  . Marital status: Married    Spouse name: [name]  . Number of children: 1  . Years of education: Not on file  . Highest education level: Not on file  Occupational History  . Occupation: MED TECH   Employer: [employer]  Social Needs  . Financial resource strain: Not on file  . Food insecurity:    Worry: Not on file    Inability: Not on file  . Transportation needs:    Medical: Not on file    Non-medical: Not on file  Tobacco Use  . Smoking status: Never Smoker  . Smokeless tobacco: Never Used  Substance and Sexual Activity  . Alcohol use: Yes    Alcohol/week: 0.3 standard drinks  . Drug use: No  . Sexual activity: Yes    Partners: Male    Comment: married, [place]  Lifestyle  . Physical activity:    Days per week: Not on file    Minutes per session: Not on file  . Stress: Not on file  Relationships  . Social connections:    Talks on phone: Not on file    Gets together: Not on file    Attends religious service: Not on file    Active member of club or organization: Not on file    Attends meetings of clubs or organizations: Not on file    Relationship status: Not on file  Other Topics Concern  . Not on file  Social History Narrative  . Not on file   Allergies  Allergen Reactions  . Cefaclor     REACTION: urticaria (hives)  . Neomycin-Bacitracin Zn-Polymyx  REACTION: rash   Family History  Problem Relation Age of Onset  . Hypertension Mother   . Leukemia Father   . Hypertension Father   . Diabetes Brother   . Hypertension Brother   . Hyperlipidemia Maternal Grandmother   . Cancer Maternal Grandfather   . Heart disease Paternal Grandfather   . Stroke Paternal Grandfather   . Breast cancer Maternal Aunt   . Breast cancer Paternal Aunt     Current Outpatient Medications (Endocrine & Metabolic):  .  predniSONE (DELTASONE) 50 MG tablet, Take one tablet a day for the next 5 days.  Current Outpatient Medications (Cardiovascular):  .  amLODipine (NORVASC) 5 MG tablet, Take 5 mg by mouth every evening.   Current Outpatient Medications (Analgesics):  .  aspirin 81 MG tablet, Take 81 mg by mouth daily. .  traMADol (ULTRAM) 50 MG tablet, Take 1  tablet (50 mg total) by mouth every 12 (twelve) hours as needed.  Current Outpatient Medications (Hematological):  .  folic acid (FOLVITE) 1 MG tablet, Take 1 mg by mouth daily.    Current Outpatient Medications (Other):  Marland Kitchen  Ascorbic Acid (VITAMIN C PO), Take 1 tablet by mouth daily. Marland Kitchen  CALCIUM PO, Take 1 tablet by mouth daily. .  Cholecalciferol (VITAMIN D PO), Take 1 tablet by mouth daily. .  cyclobenzaprine (FLEXERIL) 10 MG tablet, TAKE 1/2 TABLET BY MOUTH AT BEDTIME. Marland Kitchen  ESTRACE VAGINAL 0.1 MG/GM vaginal cream,  .  fish oil-omega-3 fatty acids 1000 MG capsule, Take 1 g by mouth 2 (two) times daily.  Marland Kitchen  gabapentin (NEURONTIN) 400 MG capsule, Take 400 mg by mouth at bedtime. .  Multiple Vitamin (MULTIVITAMIN WITH MINERALS) TABS tablet, Take 1 tablet by mouth daily.    Past medical history, social, surgical and family history all reviewed in electronic medical record.  No pertanent information unless stated regarding to the chief complaint.   Review of Systems:  No headache, visual changes, nausea, vomiting, diarrhea, constipation, dizziness, abdominal pain, skin rash, fevers, chills, night sweats, weight loss, swollen lymph nodes, body aches, joint swelling, chest pain, shortness of breath, mood changes.  Muscle aches  Objective  Blood pressure 120/86, pulse 88, height 5\' 6"  (1.676 m), weight 111 lb (50.3 kg), SpO2 98 %.   General: No apparent distress alert and oriented x3 mood and affect normal, dressed appropriately.  HEENT: Pupils equal, extraocular movements intact  Respiratory: Patient's speak in full sentences and does not appear short of breath  Cardiovascular: No lower extremity edema, non tender, no erythema  Skin: Warm dry intact with no signs of infection or rash on extremities or on axial skeleton.  Abdomen: Soft nontender  Neuro: Cranial nerves II through XII are intact, neurovascularly intact in all extremities with 2+ DTRs and 2+ pulses.  Lymph: No lymphadenopathy of  posterior or anterior cervical chain or axillae bilaterally.  Gait normal with good balance and coordination.  MSK:  tender with full range of motion and good stability and symmetric strength and tone of shoulders, elbows, wrist, hip, knee and ankles bilaterally.    Back exam has loss of lordosis with some degenerative scoliosis.  Patient does have a positive straight leg test on the left side and 25 degrees of forward flexion.  Strength though is intact and deep tendon reflexes are intact.  Positive Corky Sox still on the left side as well.    Impression and Recommendations:     This case required medical decision making of moderate complexity. The above  documentation has been reviewed and is accurate and complete [name], DO       Note: This dictation was prepared with Dragon dictation along with smaller phrase technology. Any transcriptional errors that result from this process are unintentional.

## 2019-01-20 DIAGNOSIS — R636 Underweight: Secondary | ICD-10-CM | POA: Diagnosis not present

## 2019-01-20 DIAGNOSIS — M810 Age-related osteoporosis without current pathological fracture: Secondary | ICD-10-CM | POA: Diagnosis not present

## 2019-01-20 DIAGNOSIS — Z8781 Personal history of (healed) traumatic fracture: Secondary | ICD-10-CM | POA: Diagnosis not present

## 2019-01-27 DIAGNOSIS — H0100A Unspecified blepharitis right eye, upper and lower eyelids: Secondary | ICD-10-CM | POA: Diagnosis not present

## 2019-01-27 DIAGNOSIS — H0012 Chalazion right lower eyelid: Secondary | ICD-10-CM | POA: Diagnosis not present

## 2019-01-27 MED FILL — OFLOXACIN 0.3% EYE DROPS: 0.3 | 12 days supply | Qty: 5 | Fill #0

## 2019-02-02 ENCOUNTER — Other Ambulatory Visit (HOSPITAL_COMMUNITY): Payer: Self-pay | Admitting: *Deleted

## 2019-02-03 ENCOUNTER — Other Ambulatory Visit: Payer: Self-pay

## 2019-02-03 ENCOUNTER — Ambulatory Visit (HOSPITAL_COMMUNITY)
Admission: RE | Admit: 2019-02-03 | Discharge: 2019-02-03 | Disposition: A | Discharge status: Home / Self Care | Payer: PPO | Source: Ambulatory Visit | Attending: Internal Medicine | Admitting: Internal Medicine

## 2019-02-03 DIAGNOSIS — M810 Age-related osteoporosis without current pathological fracture: Secondary | ICD-10-CM | POA: Insufficient documentation

## 2019-02-03 MED ORDER — ZOLEDRONIC ACID 5 MG/100ML IV SOLN
INTRAVENOUS | Status: AC
  Administered 2019-02-03: 10:00:00 5 mg
  Filled 2019-02-03: qty 100

## 2019-02-03 MED ORDER — ZOLEDRONIC ACID 5 MG/100ML IV SOLN: 5.0000 mg | Freq: Once | INTRAVENOUS | Status: DC

## 2019-02-08 ENCOUNTER — Other Ambulatory Visit: Payer: Self-pay | Admitting: Neurological Surgery

## 2019-02-08 DIAGNOSIS — M5126 Other intervertebral disc displacement, lumbar region: Secondary | ICD-10-CM | POA: Diagnosis not present

## 2019-02-08 MED FILL — OFLOXACIN 0.3% EYE DROPS: 0.3 | 12 days supply | Qty: 5 | Fill #1

## 2019-02-10 ENCOUNTER — Encounter (HOSPITAL_COMMUNITY)
Admission: RE | Admit: 2019-02-10 | Discharge: 2019-02-10 | Disposition: A | Discharge status: Home / Self Care | Payer: PPO | Source: Ambulatory Visit | Attending: Neurological Surgery | Admitting: Neurological Surgery

## 2019-02-10 ENCOUNTER — Other Ambulatory Visit: Payer: Self-pay

## 2019-02-10 ENCOUNTER — Encounter (HOSPITAL_COMMUNITY): Payer: Self-pay

## 2019-02-10 DIAGNOSIS — Z01818 Encounter for other preprocedural examination: Secondary | ICD-10-CM | POA: Insufficient documentation

## 2019-02-10 DIAGNOSIS — J449 Chronic obstructive pulmonary disease, unspecified: Secondary | ICD-10-CM | POA: Insufficient documentation

## 2019-02-10 DIAGNOSIS — M5126 Other intervertebral disc displacement, lumbar region: Secondary | ICD-10-CM | POA: Diagnosis not present

## 2019-02-10 HISTORY — DX: Pneumonia, unspecified organism: J18.9

## 2019-02-10 LAB — CBC WITH DIFFERENTIAL/PLATELET
Abs Immature Granulocytes: 0.01 10*3/uL (ref 0.00–0.07)
Basophils Absolute: 0.1 10*3/uL (ref 0.0–0.1)
Basophils Relative: 1 %
EOS ABS: 0.2 10*3/uL (ref 0.0–0.5)
Eosinophils Relative: 3 %
HCT: 45.7 % (ref 36.0–46.0)
Hemoglobin: 14.6 g/dL (ref 12.0–15.0)
Immature Granulocytes: 0 %
Lymphocytes Relative: 23 %
Lymphs Abs: 1.5 10*3/uL (ref 0.7–4.0)
MCH: 28.3 pg (ref 26.0–34.0)
MCHC: 31.9 g/dL (ref 30.0–36.0)
MCV: 88.6 fL (ref 80.0–100.0)
Monocytes Absolute: 0.5 10*3/uL (ref 0.1–1.0)
Monocytes Relative: 8 %
NEUTROS ABS: 4.3 10*3/uL (ref 1.7–7.7)
Neutrophils Relative %: 65 %
Platelets: 218 10*3/uL (ref 150–400)
RBC: 5.16 MIL/uL — ABNORMAL HIGH (ref 3.87–5.11)
RDW: 12.8 % (ref 11.5–15.5)
WBC: 6.5 10*3/uL (ref 4.0–10.5)
nRBC: 0 % (ref 0.0–0.2)

## 2019-02-10 LAB — BASIC METABOLIC PANEL
Anion gap: 8 (ref 5–15)
BUN: 11 mg/dL (ref 8–23)
CO2: 28 mmol/L (ref 22–32)
Calcium: 9.8 mg/dL (ref 8.9–10.3)
Chloride: 101 mmol/L (ref 98–111)
Creatinine, Ser: 0.94 mg/dL (ref 0.44–1.00)
GFR calc Af Amer: >60 mL/min (ref >60)
GFR calc non Af Amer: >60 mL/min (ref >60)
GLUCOSE: 101 mg/dL — AB (ref 70–99)
POTASSIUM: 3.2 mmol/L — AB (ref 3.5–5.1)
Sodium: 137 mmol/L (ref 135–145)

## 2019-02-10 LAB — PROTIME-INR
INR: 1 (ref 0.8–1.2)
Prothrombin Time: 13.3 seconds (ref 11.4–15.2)

## 2019-02-10 LAB — SURGICAL PCR SCREEN
MRSA, PCR: NEGATIVE
Staphylococcus aureus: NEGATIVE

## 2019-02-10 NOTE — Progress Notes (Signed)
PCP - Tamala Julian  Cardiologist -   Chest x-ray - 02/10/19   EKG - 02/10/19  Stress Test -   ECHO - 06/14/13 (epic)  Cardiac Cath -   AICD-Denies PM-Denies LOOP-Denies  Sleep Study - Denies CPAP - Denies  LABS- CBC,BMP,INR,PCR  ASA- Last day taken 02/09/19    Anesthesia-  Pt denies having chest pain, sob, or fever at this time. All instructions explained to the pt, with a verbal understanding of the material. Pt agrees to go over the instructions while at home for a better understanding. The opportunity to ask questions was provided.

## 2019-02-10 NOTE — Pre-Procedure Instructions (Addendum)
Klair A Martinique  02/10/2019      [place], [place] - [address] [facility] [address] [place] [address] Phone: [phone] Fax: [place], [place] [address] Phone: [phone] Fax: [place], [place] - [place] [address] Phone: [phone] Fax: [phone]    Your procedure is scheduled on Monday 02/14/19  Report to [facility] , Entrance A  at  1330 P.M.  Call this number if you have problems the morning of surgery:  [phone]   Remember:  Do not eat or drink after midnight.     Take these medicines the morning of surgery with A SIP OF WATER  Ofloxacin (OCUFLOX)  if needed :acetaminophen (TYLENOL)  Follow your surgeon's instructions on when to stop Asprin.  If no instructions were given by your surgeon then you will need to call the office to get those instructions.    As of today, STOP taking any Aspirin (unless otherwise instructed by your surgeon), Aleve, Naproxen, Ibuprofen, Motrin, Advil, Goody's, BC's, all herbal medications, fish oil, and all vitamins.    Do not wear jewelry, make-up or nail polish.  Do not wear lotions, powders, or perfumes, or deodorant.  Do not shave 48 hours prior to surgery.    Do not bring valuables to the hospital.  [facility] is not responsible for any belongings or valuables.  Contacts, dentures or bridgework may not be worn into surgery.  Leave your suitcase in the car.  After surgery it may be brought to your room.  For patients admitted to the hospital, discharge time will be determined by your treatment team.  Patients discharged the day of surgery will not be allowed to drive home.   Special instructions:   [facility]- Preparing For Surgery  Before surgery, you can play an important  role. Because skin is not sterile, your skin needs to be as free of germs as possible. You can reduce the number of germs on your skin by washing with CHG (chlorahexidine gluconate) Soap before surgery.  CHG is an antiseptic cleaner which kills germs and bonds with the skin to continue killing germs even after washing.    Oral Hygiene is also important to reduce your risk of infection.  Remember - BRUSH YOUR TEETH THE MORNING OF SURGERY WITH YOUR REGULAR TOOTHPASTE  Please do not use if you have an allergy to CHG or antibacterial soaps. If your skin becomes reddened/irritated stop using the CHG.  Do not shave (including legs and underarms) for at least 48 hours prior to first CHG shower. It is OK to shave your face.  Please follow these instructions carefully.   1. Shower the NIGHT BEFORE SURGERY and the MORNING OF SURGERY with CHG.   2. If you chose to wash your hair, wash your hair first as usual with your normal shampoo.  3. After you shampoo, rinse your hair and body thoroughly to remove the shampoo.  4. Use CHG as you would any other liquid soap. You can apply CHG directly to the skin and wash gently with a scrungie or a clean washcloth.   5. Apply the CHG Soap to your body ONLY FROM THE NECK DOWN.  Do not use on open wounds or open sores. Avoid contact with your eyes, ears, mouth and genitals (private  parts). Wash Face and genitals (private parts)  with your normal soap.  6. Wash thoroughly, paying special attention to the area where your surgery will be performed.  7. Thoroughly rinse your body with warm water from the neck down.  8. DO NOT shower/wash with your normal soap after using and rinsing off the CHG Soap.  9. Pat yourself dry with a CLEAN TOWEL.  10. Wear CLEAN PAJAMAS to bed the night before surgery, wear comfortable clothes the morning of surgery  11. Place CLEAN SHEETS on your bed the night of your first shower and DO NOT SLEEP WITH PETS.  Day of Surgery:  Do not  apply any deodorants/lotions.  Please wear clean clothes to the hospital/surgery center.   Remember to brush your teeth WITH YOUR REGULAR TOOTHPASTE.  Please read over the following fact sheets that you were given. Pain Booklet, Coughing and Deep Breathing, MRSA Information and Surgical Site Infection Prevention

## 2019-02-14 ENCOUNTER — Ambulatory Visit (HOSPITAL_COMMUNITY): Payer: PPO | Admitting: Vascular Surgery

## 2019-02-14 ENCOUNTER — Other Ambulatory Visit: Payer: Self-pay

## 2019-02-14 ENCOUNTER — Encounter (HOSPITAL_COMMUNITY)
Admission: RE | Disposition: A | Discharge status: Home / Self Care | Payer: Self-pay | Source: Home / Self Care | Attending: Neurological Surgery

## 2019-02-14 ENCOUNTER — Observation Stay (HOSPITAL_COMMUNITY)
Admission: RE | Admit: 2019-02-14 | Discharge: 2019-02-15 | Disposition: A | Discharge status: Home / Self Care | Payer: PPO | Attending: Neurological Surgery | Admitting: Neurological Surgery

## 2019-02-14 ENCOUNTER — Ambulatory Visit (HOSPITAL_COMMUNITY): Payer: PPO

## 2019-02-14 ENCOUNTER — Encounter (HOSPITAL_COMMUNITY): Payer: Self-pay

## 2019-02-14 DIAGNOSIS — Z79899 Other long term (current) drug therapy: Secondary | ICD-10-CM | POA: Diagnosis not present

## 2019-02-14 DIAGNOSIS — Z419 Encounter for procedure for purposes other than remedying health state, unspecified: Secondary | ICD-10-CM

## 2019-02-14 DIAGNOSIS — Z888 Allergy status to other drugs, medicaments and biological substances status: Secondary | ICD-10-CM | POA: Insufficient documentation

## 2019-02-14 DIAGNOSIS — M5117 Intervertebral disc disorders with radiculopathy, lumbosacral region: Secondary | ICD-10-CM | POA: Diagnosis not present

## 2019-02-14 DIAGNOSIS — J449 Chronic obstructive pulmonary disease, unspecified: Secondary | ICD-10-CM | POA: Diagnosis not present

## 2019-02-14 DIAGNOSIS — M5127 Other intervertebral disc displacement, lumbosacral region: Secondary | ICD-10-CM | POA: Diagnosis not present

## 2019-02-14 DIAGNOSIS — Z85828 Personal history of other malignant neoplasm of skin: Secondary | ICD-10-CM | POA: Diagnosis not present

## 2019-02-14 DIAGNOSIS — M8580 Other specified disorders of bone density and structure, unspecified site: Secondary | ICD-10-CM | POA: Diagnosis not present

## 2019-02-14 DIAGNOSIS — Z7982 Long term (current) use of aspirin: Secondary | ICD-10-CM | POA: Diagnosis not present

## 2019-02-14 DIAGNOSIS — I10 Essential (primary) hypertension: Secondary | ICD-10-CM | POA: Insufficient documentation

## 2019-02-14 DIAGNOSIS — Z91048 Other nonmedicinal substance allergy status: Secondary | ICD-10-CM | POA: Insufficient documentation

## 2019-02-14 DIAGNOSIS — K219 Gastro-esophageal reflux disease without esophagitis: Secondary | ICD-10-CM | POA: Insufficient documentation

## 2019-02-14 DIAGNOSIS — Z833 Family history of diabetes mellitus: Secondary | ICD-10-CM | POA: Diagnosis not present

## 2019-02-14 DIAGNOSIS — Z881 Allergy status to other antibiotic agents status: Secondary | ICD-10-CM | POA: Insufficient documentation

## 2019-02-14 DIAGNOSIS — M4807 Spinal stenosis, lumbosacral region: Secondary | ICD-10-CM | POA: Insufficient documentation

## 2019-02-14 DIAGNOSIS — F419 Anxiety disorder, unspecified: Secondary | ICD-10-CM | POA: Insufficient documentation

## 2019-02-14 DIAGNOSIS — M48061 Spinal stenosis, lumbar region without neurogenic claudication: Secondary | ICD-10-CM | POA: Diagnosis not present

## 2019-02-14 DIAGNOSIS — Z803 Family history of malignant neoplasm of breast: Secondary | ICD-10-CM | POA: Insufficient documentation

## 2019-02-14 DIAGNOSIS — Z8249 Family history of ischemic heart disease and other diseases of the circulatory system: Secondary | ICD-10-CM | POA: Diagnosis not present

## 2019-02-14 DIAGNOSIS — Z791 Long term (current) use of non-steroidal anti-inflammatories (NSAID): Secondary | ICD-10-CM | POA: Diagnosis not present

## 2019-02-14 DIAGNOSIS — Z806 Family history of leukemia: Secondary | ICD-10-CM | POA: Diagnosis not present

## 2019-02-14 DIAGNOSIS — Z809 Family history of malignant neoplasm, unspecified: Secondary | ICD-10-CM | POA: Diagnosis not present

## 2019-02-14 DIAGNOSIS — Z9889 Other specified postprocedural states: Secondary | ICD-10-CM

## 2019-02-14 HISTORY — PX: LUMBAR LAMINECTOMY/DECOMPRESSION MICRODISCECTOMY: SHX5026

## 2019-02-14 SURGERY — LUMBAR LAMINECTOMY/DECOMPRESSION MICRODISCECTOMY 1 LEVEL
Anesthesia: General | Site: Spine Lumbar | Laterality: Left

## 2019-02-14 MED ORDER — DEXAMETHASONE SODIUM PHOSPHATE 4 MG/ML IJ SOLN: 4.0000 mg | Freq: Four times a day (QID) | INTRAMUSCULAR | Status: DC

## 2019-02-14 MED ORDER — MIDAZOLAM HCL 2 MG/2ML IJ SOLN
INTRAMUSCULAR | Status: AC
  Filled 2019-02-14: qty 2

## 2019-02-14 MED ORDER — ONDANSETRON HCL 4 MG/2ML IJ SOLN: 4.0000 mg | Freq: Once | INTRAMUSCULAR | Status: DC | PRN

## 2019-02-14 MED ORDER — THROMBIN 5000 UNITS EX SOLR
OROMUCOSAL | Status: DC | PRN
  Administered 2019-02-14: 11:00:00

## 2019-02-14 MED ORDER — HEMOSTATIC AGENTS (NO CHARGE) OPTIME
TOPICAL | Status: DC | PRN
  Administered 2019-02-14: 1

## 2019-02-14 MED ORDER — CHLORHEXIDINE GLUCONATE CLOTH 2 % EX PADS: 6.0000 | MEDICATED_PAD | Freq: Once | CUTANEOUS | Status: DC

## 2019-02-14 MED ORDER — PHENOL 1.4 % MT LIQD: 1.0000 | OROMUCOSAL | Status: DC | PRN

## 2019-02-14 MED ORDER — SODIUM CHLORIDE 0.9 % IV SOLN: 250.0000 mL | INTRAVENOUS | Status: DC

## 2019-02-14 MED ORDER — ROCURONIUM BROMIDE 50 MG/5ML IV SOSY
PREFILLED_SYRINGE | INTRAVENOUS | Status: AC
  Filled 2019-02-14: qty 15

## 2019-02-14 MED ORDER — EPHEDRINE SULFATE 50 MG/ML IJ SOLN
INTRAMUSCULAR | Status: DC | PRN
  Administered 2019-02-14: 5 mg via INTRAVENOUS
  Administered 2019-02-14 (×2): 10 mg via INTRAVENOUS

## 2019-02-14 MED ORDER — HYDROCODONE-ACETAMINOPHEN 7.5-325 MG PO TABS
1.0000 | ORAL_TABLET | Freq: Four times a day (QID) | ORAL | Status: DC
  Administered 2019-02-14 – 2019-02-15 (×4): 1 via ORAL
  Filled 2019-02-14 (×4): qty 1

## 2019-02-14 MED ORDER — BUPIVACAINE HCL (PF) 0.25 % IJ SOLN
INTRAMUSCULAR | Status: AC
  Filled 2019-02-14: qty 30

## 2019-02-14 MED ORDER — PROPOFOL 10 MG/ML IV BOLUS
INTRAVENOUS | Status: AC
  Filled 2019-02-14: qty 20

## 2019-02-14 MED ORDER — FENTANYL CITRATE (PF) 250 MCG/5ML IJ SOLN
INTRAMUSCULAR | Status: DC | PRN
  Administered 2019-02-14: 100 ug via INTRAVENOUS
  Administered 2019-02-14: 50 ug via INTRAVENOUS

## 2019-02-14 MED ORDER — VANCOMYCIN HCL IN DEXTROSE 1-5 GM/200ML-% IV SOLN
1000.0000 mg | INTRAVENOUS | Status: AC
  Administered 2019-02-14: 1000 mg via INTRAVENOUS

## 2019-02-14 MED ORDER — THROMBIN 5000 UNITS EX SOLR
CUTANEOUS | Status: DC | PRN
  Administered 2019-02-14 (×2): 5000 [IU] via TOPICAL

## 2019-02-14 MED ORDER — ONDANSETRON HCL 4 MG PO TABS: 4.0000 mg | ORAL_TABLET | Freq: Four times a day (QID) | ORAL | Status: DC | PRN

## 2019-02-14 MED ORDER — METHOCARBAMOL 1000 MG/10ML IJ SOLN
500.0000 mg | Freq: Four times a day (QID) | INTRAVENOUS | Status: DC | PRN
  Filled 2019-02-14: qty 5

## 2019-02-14 MED ORDER — HYDROMORPHONE HCL 1 MG/ML IJ SOLN
INTRAMUSCULAR | Status: AC
  Filled 2019-02-14: qty 1

## 2019-02-14 MED ORDER — DEXAMETHASONE 4 MG PO TABS
4.0000 mg | ORAL_TABLET | Freq: Four times a day (QID) | ORAL | Status: DC
  Administered 2019-02-14 – 2019-02-15 (×3): 4 mg via ORAL
  Filled 2019-02-14 (×3): qty 1

## 2019-02-14 MED ORDER — VANCOMYCIN HCL IN DEXTROSE 1-5 GM/200ML-% IV SOLN
INTRAVENOUS | Status: AC
  Administered 2019-02-14: 1000 mg via INTRAVENOUS
  Filled 2019-02-14: qty 200

## 2019-02-14 MED ORDER — SODIUM CHLORIDE 0.9 % IV SOLN
INTRAVENOUS | Status: DC | PRN
  Administered 2019-02-14: 60 ug/min via INTRAVENOUS

## 2019-02-14 MED ORDER — SODIUM CHLORIDE 0.9% FLUSH
3.0000 mL | Freq: Two times a day (BID) | INTRAVENOUS | Status: DC
  Administered 2019-02-14 (×2): 3 mL via INTRAVENOUS

## 2019-02-14 MED ORDER — CELECOXIB 200 MG PO CAPS
200.0000 mg | ORAL_CAPSULE | Freq: Two times a day (BID) | ORAL | Status: DC
  Administered 2019-02-14 (×2): 200 mg via ORAL
  Filled 2019-02-14 (×2): qty 1

## 2019-02-14 MED ORDER — FENTANYL CITRATE (PF) 250 MCG/5ML IJ SOLN
INTRAMUSCULAR | Status: AC
  Filled 2019-02-14: qty 5

## 2019-02-14 MED ORDER — EPHEDRINE 5 MG/ML INJ
INTRAVENOUS | Status: AC
  Filled 2019-02-14: qty 10

## 2019-02-14 MED ORDER — AMLODIPINE BESYLATE 5 MG PO TABS
5.0000 mg | ORAL_TABLET | Freq: Every evening | ORAL | Status: DC
  Administered 2019-02-14: 5 mg via ORAL
  Filled 2019-02-14: qty 1

## 2019-02-14 MED ORDER — DEXAMETHASONE SODIUM PHOSPHATE 10 MG/ML IJ SOLN
10.0000 mg | INTRAMUSCULAR | Status: AC
  Administered 2019-02-14: 10 mg via INTRAVENOUS

## 2019-02-14 MED ORDER — ONDANSETRON HCL 4 MG/2ML IJ SOLN
INTRAMUSCULAR | Status: DC | PRN
  Administered 2019-02-14: 4 mg via INTRAVENOUS

## 2019-02-14 MED ORDER — MIDAZOLAM HCL 5 MG/5ML IJ SOLN
INTRAMUSCULAR | Status: DC | PRN
  Administered 2019-02-14: 2 mg via INTRAVENOUS

## 2019-02-14 MED ORDER — MENTHOL 3 MG MT LOZG: 1.0000 | LOZENGE | OROMUCOSAL | Status: DC | PRN

## 2019-02-14 MED ORDER — PROPOFOL 10 MG/ML IV BOLUS
INTRAVENOUS | Status: DC | PRN
  Administered 2019-02-14: 100 mg via INTRAVENOUS

## 2019-02-14 MED ORDER — BUPIVACAINE HCL (PF) 0.25 % IJ SOLN
INTRAMUSCULAR | Status: DC | PRN
  Administered 2019-02-14: 10 mL

## 2019-02-14 MED ORDER — METHOCARBAMOL 500 MG PO TABS
500.0000 mg | ORAL_TABLET | Freq: Four times a day (QID) | ORAL | Status: DC | PRN
  Administered 2019-02-14 – 2019-02-15 (×4): 500 mg via ORAL
  Filled 2019-02-14 (×3): qty 1

## 2019-02-14 MED ORDER — THROMBIN 5000 UNITS EX SOLR
CUTANEOUS | Status: AC
  Filled 2019-02-14: qty 15000

## 2019-02-14 MED ORDER — SUGAMMADEX SODIUM 200 MG/2ML IV SOLN
INTRAVENOUS | Status: DC | PRN
  Administered 2019-02-14: 200 mg via INTRAVENOUS

## 2019-02-14 MED ORDER — POTASSIUM CHLORIDE IN NACL 20-0.9 MEQ/L-% IV SOLN: INTRAVENOUS | Status: DC

## 2019-02-14 MED ORDER — PHENYLEPHRINE 40 MCG/ML (10ML) SYRINGE FOR IV PUSH (FOR BLOOD PRESSURE SUPPORT)
PREFILLED_SYRINGE | INTRAVENOUS | Status: AC
  Filled 2019-02-14: qty 20

## 2019-02-14 MED ORDER — ONDANSETRON HCL 4 MG/2ML IJ SOLN: 4.0000 mg | Freq: Four times a day (QID) | INTRAMUSCULAR | Status: DC | PRN

## 2019-02-14 MED ORDER — DEXAMETHASONE SODIUM PHOSPHATE 10 MG/ML IJ SOLN
INTRAMUSCULAR | Status: AC
  Filled 2019-02-14: qty 1

## 2019-02-14 MED ORDER — ONDANSETRON HCL 4 MG/2ML IJ SOLN
INTRAMUSCULAR | Status: AC
  Filled 2019-02-14: qty 2

## 2019-02-14 MED ORDER — SENNA 8.6 MG PO TABS
1.0000 | ORAL_TABLET | Freq: Two times a day (BID) | ORAL | Status: DC
  Administered 2019-02-14: 8.6 mg via ORAL
  Filled 2019-02-14: qty 1

## 2019-02-14 MED ORDER — LACTATED RINGERS IV SOLN
INTRAVENOUS | Status: DC
  Administered 2019-02-14 (×2): via INTRAVENOUS

## 2019-02-14 MED ORDER — ACETAMINOPHEN 325 MG PO TABS: 650.0000 mg | ORAL_TABLET | ORAL | Status: DC | PRN

## 2019-02-14 MED ORDER — ACETAMINOPHEN 650 MG RE SUPP: 650.0000 mg | RECTAL | Status: DC | PRN

## 2019-02-14 MED ORDER — GABAPENTIN 400 MG PO CAPS
400.0000 mg | ORAL_CAPSULE | Freq: Every day | ORAL | Status: DC
  Administered 2019-02-14: 400 mg via ORAL
  Filled 2019-02-14: qty 1

## 2019-02-14 MED ORDER — SODIUM CHLORIDE 0.9% FLUSH: 3.0000 mL | INTRAVENOUS | Status: DC | PRN

## 2019-02-14 MED ORDER — MEPERIDINE HCL 50 MG/ML IJ SOLN: 6.2500 mg | INTRAMUSCULAR | Status: DC | PRN

## 2019-02-14 MED ORDER — OFLOXACIN 0.3 % OP SOLN
1.0000 [drp] | Freq: Four times a day (QID) | OPHTHALMIC | Status: DC
  Filled 2019-02-14: qty 5

## 2019-02-14 MED ORDER — ROCURONIUM BROMIDE 10 MG/ML (PF) SYRINGE
PREFILLED_SYRINGE | INTRAVENOUS | Status: DC | PRN
  Administered 2019-02-14: 50 mg via INTRAVENOUS

## 2019-02-14 MED ORDER — PHENYLEPHRINE 40 MCG/ML (10ML) SYRINGE FOR IV PUSH (FOR BLOOD PRESSURE SUPPORT)
PREFILLED_SYRINGE | INTRAVENOUS | Status: DC | PRN
  Administered 2019-02-14 (×2): 80 ug via INTRAVENOUS
  Administered 2019-02-14: 40 ug via INTRAVENOUS

## 2019-02-14 MED ORDER — LIDOCAINE 2% (20 MG/ML) 5 ML SYRINGE
INTRAMUSCULAR | Status: DC | PRN
  Administered 2019-02-14: 80 mg via INTRAVENOUS

## 2019-02-14 MED ORDER — 0.9 % SODIUM CHLORIDE (POUR BTL) OPTIME
TOPICAL | Status: DC | PRN
  Administered 2019-02-14: 1000 mL

## 2019-02-14 MED ORDER — HYDROMORPHONE HCL 1 MG/ML IJ SOLN
0.2500 mg | INTRAMUSCULAR | Status: DC | PRN
  Administered 2019-02-14 (×2): 0.5 mg via INTRAVENOUS

## 2019-02-14 MED ORDER — LIDOCAINE 2% (20 MG/ML) 5 ML SYRINGE
INTRAMUSCULAR | Status: AC
  Filled 2019-02-14: qty 10

## 2019-02-14 MED ORDER — MORPHINE SULFATE (PF) 2 MG/ML IV SOLN: 1.0000 mg | INTRAVENOUS | Status: DC | PRN

## 2019-02-14 MED ORDER — METHOCARBAMOL 500 MG PO TABS
ORAL_TABLET | ORAL | Status: AC
  Filled 2019-02-14: qty 1

## 2019-02-14 SURGICAL SUPPLY — 46 items
BAG DECANTER FOR FLEXI CONT (MISCELLANEOUS) ×3 IMPLANT
BENZOIN TINCTURE PRP APPL 2/3 (GAUZE/BANDAGES/DRESSINGS) IMPLANT
BUR MATCHSTICK NEURO 3.0 LAGG (BURR) ×3 IMPLANT
CANISTER SUCT 3000ML PPV (MISCELLANEOUS) ×3 IMPLANT
CARTRIDGE OIL MAESTRO DRILL (MISCELLANEOUS) ×1 IMPLANT
CLOSURE WOUND 1/2 X4 (GAUZE/BANDAGES/DRESSINGS)
COVER WAND RF STERILE (DRAPES) ×3 IMPLANT
DERMABOND ADVANCED (GAUZE/BANDAGES/DRESSINGS) ×2
DERMABOND ADVANCED .7 DNX12 (GAUZE/BANDAGES/DRESSINGS) ×1 IMPLANT
DIFFUSER DRILL AIR PNEUMATIC (MISCELLANEOUS) ×3 IMPLANT
DRAPE LAPAROTOMY 100X72X124 (DRAPES) ×3 IMPLANT
DRAPE MICROSCOPE LEICA (MISCELLANEOUS) ×3 IMPLANT
DRAPE POUCH INSTRU U-SHP 10X18 (DRAPES) ×3 IMPLANT
DRAPE SURG 17X23 STRL (DRAPES) ×3 IMPLANT
DRSG OPSITE POSTOP 4X6 (GAUZE/BANDAGES/DRESSINGS) IMPLANT
DURAPREP 26ML APPLICATOR (WOUND CARE) ×3 IMPLANT
ELECT REM PT RETURN 9FT ADLT (ELECTROSURGICAL) ×3
ELECTRODE REM PT RTRN 9FT ADLT (ELECTROSURGICAL) ×1 IMPLANT
GAUZE 4X4 16PLY RFD (DISPOSABLE) IMPLANT
GLOVE BIO SURGEON STRL SZ7 (GLOVE) IMPLANT
GLOVE BIO SURGEON STRL SZ8 (GLOVE) ×3 IMPLANT
GLOVE BIOGEL PI IND STRL 7.0 (GLOVE) IMPLANT
GLOVE BIOGEL PI INDICATOR 7.0 (GLOVE)
GOWN STRL REUS W/ TWL LRG LVL3 (GOWN DISPOSABLE) IMPLANT
GOWN STRL REUS W/ TWL XL LVL3 (GOWN DISPOSABLE) ×1 IMPLANT
GOWN STRL REUS W/TWL 2XL LVL3 (GOWN DISPOSABLE) IMPLANT
GOWN STRL REUS W/TWL LRG LVL3 (GOWN DISPOSABLE)
GOWN STRL REUS W/TWL XL LVL3 (GOWN DISPOSABLE) ×2
KIT BASIN OR (CUSTOM PROCEDURE TRAY) ×3 IMPLANT
KIT TURNOVER KIT B (KITS) ×3 IMPLANT
NEEDLE HYPO 25X1 1.5 SAFETY (NEEDLE) ×3 IMPLANT
NEEDLE SPNL 20GX3.5 QUINCKE YW (NEEDLE) IMPLANT
NS IRRIG 1000ML POUR BTL (IV SOLUTION) ×3 IMPLANT
OIL CARTRIDGE MAESTRO DRILL (MISCELLANEOUS) ×3
PACK LAMINECTOMY NEURO (CUSTOM PROCEDURE TRAY) ×3 IMPLANT
PAD ARMBOARD 7.5X6 YLW CONV (MISCELLANEOUS) ×9 IMPLANT
RUBBERBAND STERILE (MISCELLANEOUS) ×6 IMPLANT
SPONGE SURGIFOAM ABS GEL SZ50 (HEMOSTASIS) IMPLANT
STRIP CLOSURE SKIN 1/2X4 (GAUZE/BANDAGES/DRESSINGS) IMPLANT
SUT VIC AB 0 CT1 18XCR BRD8 (SUTURE) ×1 IMPLANT
SUT VIC AB 0 CT1 8-18 (SUTURE) ×2
SUT VIC AB 2-0 CP2 18 (SUTURE) ×3 IMPLANT
SUT VIC AB 3-0 SH 8-18 (SUTURE) ×3 IMPLANT
TOWEL GREEN STERILE (TOWEL DISPOSABLE) ×3 IMPLANT
TOWEL GREEN STERILE FF (TOWEL DISPOSABLE) ×3 IMPLANT
WATER STERILE IRR 1000ML POUR (IV SOLUTION) ×3 IMPLANT

## 2019-02-14 NOTE — Anesthesia Preprocedure Evaluation (Signed)
Anesthesia Evaluation  Patient identified by MRN, date of birth, ID band Patient awake    Reviewed: Allergy & Precautions, NPO status , Patient's Chart, lab work & pertinent test results  Airway Mallampati: I  TM Distance: >3 FB Neck ROM: Full    Dental   Pulmonary    Pulmonary exam normal        Cardiovascular hypertension, Pt. on medications Normal cardiovascular exam     Neuro/Psych Anxiety    GI/Hepatic GERD  ,  Endo/Other    Renal/GU      Musculoskeletal   Abdominal   Peds  Hematology   Anesthesia Other Findings   Reproductive/Obstetrics                             Anesthesia Physical Anesthesia Plan  ASA: II  Anesthesia Plan: General   Post-op Pain Management:    Induction: Intravenous  PONV Risk Score and Plan: 3 and Ondansetron, Midazolam and Dexamethasone  Airway Management Planned: Oral ETT  Additional Equipment:   Intra-op Plan:   Post-operative Plan: Extubation in OR  Informed Consent: I have reviewed the patients History and Physical, chart, labs and discussed the procedure including the risks, benefits and alternatives for the proposed anesthesia with the patient or authorized representative who has indicated his/her understanding and acceptance.       Plan Discussed with: CRNA and Surgeon  Anesthesia Plan Comments:         Anesthesia Quick Evaluation

## 2019-02-14 NOTE — Progress Notes (Signed)
Pharmacy Antibiotic Note  Tracey [name] is a 68 y.o. female admitted on 02/14/2019 with Left L5-S1 hemilaminectomy, medial facetectomy foraminotomy followed by microdiscectomy utilizing microscopic dissection.  Pharmacy has been consulted for Vancomycin dosing. No drains noted.   Patient received a large dose of vancomycin pre-op (~20mg /kg) with a CrCl ~45. Will not need further vancomycin post-op as will be covered by preop dose through 24 hours post-op.  Plan: No further vancomycin needed Pharmacy will sign off.   Height: 5\' 6"  (167.6 cm) Weight: 112 lb 12.8 oz (51.2 kg) IBW/kg (Calculated) : 59.3  Temp (24hrs), Avg:97.8 F (36.6 C), Min:97.5 F (36.4 C), Max:98 F (36.7 C)  Recent Labs  Lab 02/10/19 1558  WBC 6.5  CREATININE 0.94    Estimated Creatinine Clearance: 45.7 mL/min (by C-G formula based on SCr of 0.94 mg/dL).    Allergies  Allergen Reactions  . Adhesive [Tape]   . Cefaclor Hives  . Fosamax [Alendronate Sodium] Other (See Comments)    Gi upset  . Neomycin-Bacitracin Zn-Polymyx Rash    [name] A. Levada Dy, PharmD, [place] Pager: [phone] Please utilize Amion for appropriate phone number to reach the unit pharmacist ([place])    02/14/2019 1:54 PM

## 2019-02-14 NOTE — Op Note (Signed)
02/14/2019  12:01 PM  PATIENT:  Tracey [name]  68 y.o. 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 Imaging showed final stenosis L5 S1 on the left. The patient tried conservative measures without relief. Pain was debilitating. Recommended left L5-S1 decompression. Patient understood the risks, benefits, and alternatives and potential outcomes and wished to proceed.  PROCEDURE DETAILS: The patient was taken to the operating room and after induction of adequate generalized endotracheal anesthesia, the patient was rolled into the prone position on the Wilson frame and all pressure points were padded. The lumbar region was cleaned and then prepped with DuraPrep and draped in the usual sterile fashion. 5 cc of local anesthesia was injected and then a dorsal midline incision was made and carried down to the lumbo sacral fascia. The fascia was opened and the paraspinous musculature was taken down in a subperiosteal fashion to expose L5-S1 on the left. Intraoperative x-ray confirmed my level, and then I used a combination of the high-speed drill and the Kerrison punches to perform a hemilaminectomy, medial facetectomy, and foraminotomy at L5-S1 on the left. The underlying yellow ligament was opened and removed in a piecemeal fashion to expose the underlying dura and exiting nerve root. I undercut the lateral recess and dissected down until I was medial to and distal to the  pedicle. The nerve root was well decompressed. We then gently retracted the nerve root medially with a retractor, coagulated the epidural venous vasculature, and incised the disc space. I performed a thorough intradiscal discectomy with pituitary rongeurs and curettes, until I had a nice decompression of the nerve root and the midline. I then palpated with a coronary dilator along the nerve root and into the foramen to assure adequate decompression. I felt no more compression of the nerve root. I irrigated with saline solution containing bacitracin. Achieved hemostasis with bipolar cautery, lined the dura with Gelfoam, and then closed the fascia with 0 Vicryl. I closed the subcutaneous tissues with 2-0 Vicryl and the subcuticular tissues with 3-0 Vicryl. The skin was then closed with benzoin and Steri-Strips. The drapes were removed, a sterile dressing was applied. The patient was awakened from general anesthesia and transferred to the recovery room in stable condition. At the end of the procedure all sponge, needle and instrument counts were correct.    PLAN OF CARE: Admit for overnight observation  PATIENT DISPOSITION:  PACU - hemodynamically stable.   Delay start of Pharmacological VTE agent (>24hrs) due to surgical blood loss or risk of bleeding:  yes

## 2019-02-14 NOTE — H&P (Signed)
Subjective: Patient is a 68 y.o. female admitted for L leg pain. Onset of symptoms was several months ago, gradually worsening since that time.  The pain is rated severe, and is located at the across the lower back and radiates to LLE. The pain is described as aching and occurs all day. The symptoms have been progressive. Symptoms are exacerbated by exercise. MRI or CT showed stenosis L5-S1 L   Past Medical History:  Diagnosis Date  . Closed hip fracture ([place]) 03/2016   RT HIP  . Hypertension   . Osteopenia   . Osteoporosis    h/o  . Pneumonia   . Skin cancer    multiple skin cancers    Past Surgical History:  Procedure Laterality Date  . CALDWELL LUC    . COLONOSCOPY WITH PROPOFOL N/A 03/22/2014   Procedure: COLONOSCOPY WITH PROPOFOL;  Surgeon: Arta Silence, MD;  Location: [facility];  Service: Endoscopy;  Laterality: N/A;  . HIP PINNING,CANNULATED Right 03/21/2016   Procedure: CANNULATED HIP PINNING;  Surgeon: Rod Can, MD;  Location: [facility];  Service: Orthopedics;  Laterality: Right;  . NASAL SEPTUM SURGERY     past hx. deviated septum  . TONSILLECTOMY      Prior to Admission medications   Medication Sig Start Date End Date Taking? Authorizing Provider  acetaminophen (TYLENOL) 500 MG tablet Take 1,000 mg by mouth every 6 (six) hours as needed (for pain.).   Yes [provider]  amLODipine (NORVASC) 5 MG tablet Take 5 mg by mouth every evening.   Yes [provider]  cholecalciferol (VITAMIN D) 25 MCG (1000 UT) tablet Take 1,000 Units by mouth daily.   Yes [provider]  cyclobenzaprine (FLEXERIL) 10 MG tablet TAKE 1/2 TABLET BY MOUTH AT BEDTIME. Patient taking differently: Take 5 mg by mouth at bedtime.  01/17/19  Yes [name], DO  ESTRACE VAGINAL 0.1 MG/GM vaginal cream Place 1 Applicatorful vaginally 2 (two) times a week. Sundays & Wednesdays. 08/20/16  Yes [provider]  fish oil-omega-3 fatty acids 1000 MG capsule Take  1,000 mg by mouth 2 (two) times daily.    Yes [provider]  folic acid (FOLVITE) 1 MG tablet Take 1 mg by mouth daily.     Yes [provider]  gabapentin (NEURONTIN) 400 MG capsule Take 400 mg by mouth at bedtime.   Yes [provider]  ibuprofen (ADVIL,MOTRIN) 200 MG tablet Take 400 mg by mouth every 8 (eight) hours as needed (pain).   Yes [provider]  metroNIDAZOLE (METROGEL) 1 % gel Apply 1 application topically 2 (two) times daily. Applied to face 11/30/18  Yes [provider]  Multiple Minerals-Vitamins (CAL MAG ZINC +D3 PO) Take 2 tablets by mouth 2 (two) times daily.   Yes [provider]  Multiple Vitamin (MULTIVITAMIN WITH MINERALS) TABS tablet Take 1 tablet by mouth daily.   Yes [provider]  ofloxacin (OCUFLOX) 0.3 % ophthalmic solution Place 1 drop into both eyes 4 (four) times daily.  02/08/19  Yes [provider]  psyllium (METAMUCIL) 58.6 % packet Take 1 packet by mouth daily.   Yes [provider]  Zoledronic Acid (RECLAST IV) Inject 1 Dose into the vein once. Once a year   Yes [provider]  aspirin EC 81 MG tablet Take 81 mg by mouth every evening.    [provider]  predniSONE (DELTASONE) 50 MG tablet Take one tablet a day for the next 5 days. Patient  not taking: Reported on 02/08/2019 08/16/18   [name], DO  traMADol (ULTRAM) 50 MG tablet Take 1 tablet (50 mg total) by mouth every 12 (twelve) hours as needed. Patient not taking: Reported on 02/08/2019 09/22/18   [name], DO   Allergies  Allergen Reactions  . Adhesive [Tape]   . Cefaclor Hives  . Fosamax [Alendronate Sodium] Other (See Comments)    Gi upset  . Neomycin-Bacitracin Zn-Polymyx Rash    Social History   Tobacco Use  . Smoking status: Never Smoker  . Smokeless tobacco: Never Used  Substance Use Topics  . Alcohol use: Not Currently    Alcohol/week: 0.3 standard drinks    Family  History  Problem Relation Age of Onset  . Hypertension Mother   . Leukemia Father   . Hypertension Father   . Diabetes Brother   . Hypertension Brother   . Hyperlipidemia Maternal Grandmother   . Cancer Maternal Grandfather   . Heart disease Paternal Grandfather   . Stroke Paternal Grandfather   . Breast cancer Maternal Aunt   . Breast cancer Paternal Aunt      Review of Systems  Positive ROS: neg  All other systems have been reviewed and were otherwise negative with the exception of those mentioned in the HPI and as above.  Objective: Vital signs in last 24 hours: Temp:  [98 F (36.7 C)] 98 F (36.7 C) (03/16 0810) Pulse Rate:  [86] 86 (03/16 0810) Resp:  [20] 20 (03/16 0810) BP: (154)/(83) 154/83 (03/16 0810) SpO2:  [100 %] 100 % (03/16 0810) Weight:  [51.2 kg] 51.2 kg (03/16 0824)  General Appearance: Alert, cooperative, no distress, appears stated age Head: Normocephalic, without obvious abnormality, atraumatic Eyes: PERRL, conjunctiva/corneas clear, EOM's intact    Neck: Supple, symmetrical, trachea midline Back: Symmetric, no curvature, ROM normal, no CVA tenderness Lungs:  respirations unlabored Heart: Regular rate and rhythm Abdomen: Soft, non-tender Extremities: Extremities normal, atraumatic, no cyanosis or edema Pulses: 2+ and symmetric all extremities Skin: Skin color, texture, turgor normal, no rashes or lesions  NEUROLOGIC:   Mental status: Alert and oriented x4,  no aphasia, good attention span, fund of knowledge, and memory Motor Exam - grossly normal Sensory Exam - grossly normal Reflexes: 1+ Coordination - grossly normal Gait - grossly normal Balance - grossly normal Cranial Nerves: I: smell Not tested  II: visual acuity  OS: nl    OD: nl  II: visual fields Full to confrontation  II: pupils Equal, round, reactive to light  III,VII: ptosis None  III,IV,VI: extraocular muscles  Full ROM  V: mastication Normal  V: facial light touch  sensation  Normal  V,VII: corneal reflex  Present  VII: facial muscle function - upper  Normal  VII: facial muscle function - lower Normal  VIII: hearing Not tested  IX: soft palate elevation  Normal  IX,X: gag reflex Present  XI: trapezius strength  5/5  XI: sternocleidomastoid strength 5/5  XI: neck flexion strength  5/5  XII: tongue strength  Normal    Data Review Lab Results  Component Value Date   WBC 6.5 02/10/2019   HGB 14.6 02/10/2019   HCT 45.7 02/10/2019   MCV 88.6 02/10/2019   PLT 218 02/10/2019   Lab Results  Component Value Date   NA 137 02/10/2019   K 3.2 (L) 02/10/2019   CL 101 02/10/2019   CO2 28 02/10/2019   BUN 11 02/10/2019   CREATININE 0.94 02/10/2019   GLUCOSE 101 (  H) 02/10/2019   Lab Results  Component Value Date   INR 1.0 02/10/2019    Assessment/Plan:  Estimated body mass index is 18.21 kg/m as calculated from the following:   Height as of this encounter: 5\' 6"  (1.676 m).   Weight as of this encounter: 51.2 kg. Patient admitted for L L5-S1 lami. Patient has failed a reasonable attempt at conservative therapy.  I explained the condition and procedure to the patient and answered any questions.  Patient wishes to proceed with procedure as planned. Understands risks/ benefits and typical outcomes of procedure.   Eustace Moore 02/14/2019 9:26 AM

## 2019-02-14 NOTE — Transfer of Care (Signed)
Immediate Anesthesia Transfer of Care Note  Patient: Tracey [name]  Procedure(s) Performed: Laminectomy and Foraminotomy - L5-S1 - left (Left Spine Lumbar)  Patient Location: PACU  Anesthesia Type:General  Level of Consciousness: awake, alert  and oriented  Airway & Oxygen Therapy: Patient Spontanous Breathing and Patient connected to nasal cannula oxygen  Post-op Assessment: Report given to RN, Post -op Vital signs reviewed and stable and Patient moving all extremities X 4  Post vital signs: Reviewed and stable  Last Vitals:  Vitals Value Taken Time  BP    Temp    Pulse 74 02/14/2019 12:16 PM  Resp    SpO2 100 % 02/14/2019 12:16 PM  Vitals shown include unvalidated device data.  Last Pain:  Vitals:   02/14/19 0822  TempSrc:   PainSc: 0-No pain      Patients Stated Pain Goal: 3 (51/76/16 0737)  Complications: No apparent anesthesia complications

## 2019-02-14 NOTE — Anesthesia Procedure Notes (Signed)
Procedure Name: Intubation Date/Time: 02/14/2019 11:02 AM Performed by: Mariea Clonts, CRNA Pre-anesthesia Checklist: Patient identified, Emergency Drugs available, Suction available and Patient being monitored Patient Re-evaluated:Patient Re-evaluated prior to induction Oxygen Delivery Method: Circle System Utilized Preoxygenation: Pre-oxygenation with 100% oxygen Induction Type: IV induction Ventilation: Mask ventilation without difficulty Laryngoscope Size: Miller and 2 Grade View: Grade II Tube type: Oral Tube size: 7.0 mm Number of attempts: 1 Airway Equipment and Method: Stylet and Oral airway Placement Confirmation: ETT inserted through vocal cords under direct vision,  positive ETCO2 and breath sounds checked- equal and bilateral Tube secured with: Tape Dental Injury: Teeth and Oropharynx as per pre-operative assessment

## 2019-02-14 NOTE — Anesthesia Postprocedure Evaluation (Signed)
Anesthesia Post Note  Patient: Tracey [name]  Procedure(s) Performed: Laminectomy and Foraminotomy - L5-S1 - left (Left Spine Lumbar)     Patient location during evaluation: PACU Anesthesia Type: General Level of consciousness: awake and alert Pain management: pain level controlled Vital Signs Assessment: post-procedure vital signs reviewed and stable Respiratory status: spontaneous breathing, nonlabored ventilation, respiratory function stable and patient connected to nasal cannula oxygen Cardiovascular status: blood pressure returned to baseline and stable Postop Assessment: no apparent nausea or vomiting Anesthetic complications: no    Last Vitals:  Vitals:   02/14/19 1315 02/14/19 1337  BP:  (!) 152/80  Pulse: 70 68  Resp: 16 20  Temp:  (!) 36.4 C  SpO2: 100%     Last Pain:  Vitals:   02/14/19 1337  TempSrc: Oral  PainSc:                  Jesslynn Kruck DAVID

## 2019-02-15 ENCOUNTER — Encounter (HOSPITAL_COMMUNITY): Payer: Self-pay | Admitting: Neurological Surgery

## 2019-02-15 DIAGNOSIS — M5117 Intervertebral disc disorders with radiculopathy, lumbosacral region: Secondary | ICD-10-CM | POA: Diagnosis not present

## 2019-02-15 MED ORDER — CYCLOBENZAPRINE HCL 10 MG PO TABS: 5.0000 mg | ORAL_TABLET | Freq: Three times a day (TID) | ORAL | 1 refills | Status: DC | PRN

## 2019-02-15 MED ORDER — TRAMADOL HCL 50 MG PO TABS: 50.0000 mg | ORAL_TABLET | Freq: Four times a day (QID) | ORAL | 1 refills | Status: DC | PRN

## 2019-02-15 MED FILL — CYCLOBENZAPRINE 10 MG TAB: 10 | 20 days supply | Qty: 30 | Fill #0

## 2019-02-15 MED FILL — traMADol HCL 50 MG TABS: 50 | 7 days supply | Qty: 30 | Fill #0

## 2019-02-15 NOTE — Discharge Summary (Signed)
Physician Discharge Summary  Patient ID: Tracey [name] MRN: [id number] DOB/AGE: [birth year] 68 y.o.  Admit date: 02/14/2019 Discharge date: 02/15/2019  Admission Diagnoses: HNP L5-S1 with stenosis, radiculopathy    Discharge Diagnoses: same   Discharged Condition: good  Hospital Course: The patient was admitted on 02/14/2019 and taken to the operating room where the patient underwent L5-S1 microdiskectomy. The patient tolerated the procedure well and was taken to the recovery room and then to the floor in stable condition. The hospital course was routine. There were no complications. The wound remained clean dry and intact. Pt had appropriate back soreness. No complaints of leg pain or new N/T/W. The patient remained afebrile with stable vital signs, and tolerated a regular diet. The patient continued to increase activities, and pain was well controlled with oral pain medications.   Consults: None  Significant Diagnostic Studies:  Results for orders placed or performed during the hospital encounter of 02/10/19  Surgical pcr screen  Result Value Ref Range   MRSA, PCR NEGATIVE NEGATIVE   Staphylococcus aureus NEGATIVE NEGATIVE  Basic metabolic panel  Result Value Ref Range   Sodium 137 135 - 145 mmol/L   Potassium 3.2 (L) 3.5 - 5.1 mmol/L   Chloride 101 98 - 111 mmol/L   CO2 28 22 - 32 mmol/L   Glucose, Bld 101 (H) 70 - 99 mg/dL   BUN 11 8 - 23 mg/dL   Creatinine, Ser 0.94 0.44 - 1.00 mg/dL   Calcium 9.8 8.9 - 10.3 mg/dL   GFR calc non Af Amer >60 >60 mL/min   GFR calc Af Amer >60 >60 mL/min   Anion gap 8 5 - 15  CBC WITH DIFFERENTIAL  Result Value Ref Range   WBC 6.5 4.0 - 10.5 K/uL   RBC 5.16 (H) 3.87 - 5.11 MIL/uL   Hemoglobin 14.6 12.0 - 15.0 g/dL   HCT 45.7 36.0 - 46.0 %   MCV 88.6 80.0 - 100.0 fL   MCH 28.3 26.0 - 34.0 pg   MCHC 31.9 30.0 - 36.0 g/dL   RDW 12.8 11.5 - 15.5 %   Platelets 218 150 - 400 K/uL   nRBC 0.0 0.0 - 0.2 %   Neutrophils Relative % 65 %   Neutro Abs 4.3 1.7 - 7.7 K/uL   Lymphocytes Relative 23 %   Lymphs Abs 1.5 0.7 - 4.0 K/uL   Monocytes Relative 8 %   Monocytes Absolute 0.5 0.1 - 1.0 K/uL   Eosinophils Relative 3 %   Eosinophils Absolute 0.2 0.0 - 0.5 K/uL   Basophils Relative 1 %   Basophils Absolute 0.1 0.0 - 0.1 K/uL   Immature Granulocytes 0 %   Abs Immature Granulocytes 0.01 0.00 - 0.07 K/uL  Protime-INR  Result Value Ref Range   Prothrombin Time 13.3 11.4 - 15.2 seconds   INR 1.0 0.8 - 1.2    Chest 2 View  Result Date: 02/11/2019 CLINICAL DATA:  Pre lumbar surgery evaluation. EXAM: CHEST - 2 VIEW COMPARISON:  02/24/2018. FINDINGS: Normal sized heart. Clear lungs. The lungs remain hyperexpanded. Mild scoliosis. IMPRESSION: No acute abnormality. Stable changes of COPD. Electronically Signed   By: Claudie Revering M.D.   On: 02/11/2019 08:22   Dg Lumbar Spine 2-3 Views  Result Date: 02/14/2019 CLINICAL DATA:  Localization images for L5-S1 laminectomy EXAM: LUMBAR SPINE - 2-3 VIEW COMPARISON:  Cross-table lateral views compared to MRI lumbar spine of 09/25/2018 FINDINGS: Prior MRI labeled with 5 lumbar vertebra. Image 1 @1103  hours: Metallic  probe via dorsal approach projects dorsal to the S1 vertebral body. Bones appear demineralized. Disc space narrowing and endplate spur formation at L2-L3. Image 2 @ 1118 hours: Curved metallic probe via dorsal approach projects dorsal to the L5-S1 disc space level. Remaining findings unchanged from earlier image. IMPRESSION: Lumbar localization images as above. Electronically Signed   By: Lavonia Dana M.D.   On: 02/14/2019 11:36    Antibiotics:  Anti-infectives (From admission, onward)   Start     Dose/Rate Route Frequency Ordered Stop   02/14/19 0715  vancomycin (VANCOCIN) IVPB 1000 mg/200 mL premix     1,000 mg 200 mL/hr over 60 Minutes Intravenous On call to O.R. 02/14/19 0705 02/14/19 1004      Discharge Exam: Blood pressure 106/61, pulse 82, temperature 98 F (36.7 C),  temperature source Oral, resp. rate 18, height 5\' 6"  (1.676 m), weight 51.2 kg, SpO2 99 %. Neurologic: Grossly normal Dressing dry  Discharge Medications:   Resume home meds only  Disposition: home   Final Dx: lumbar microdiskectomy  Discharge Instructions     Remove dressing in 72 hours   Complete by:  As directed    Call MD for:  difficulty breathing, headache or visual disturbances   Complete by:  As directed    Call MD for:  persistant nausea and vomiting   Complete by:  As directed    Call MD for:  redness, tenderness, or signs of infection (pain, swelling, redness, odor or green/yellow discharge around incision site)   Complete by:  As directed    Call MD for:  severe uncontrolled pain   Complete by:  As directed    Call MD for:  temperature >100.4   Complete by:  As directed    Diet - low sodium heart healthy   Complete by:  As directed    Increase activity slowly   Complete by:  As directed          Signed: Eustace Moore 02/15/2019, 8:04 AM

## 2019-02-15 NOTE — Evaluation (Signed)
Occupational Therapy Evaluation Patient Details Name: Tracey [name] MRN: [id number] DOB: 1951-03-15 Today's Date: 02/15/2019    History of Present Illness  68 yo female admitted for L leg pain located across her lower back and radiating down LLE. MRI or CT showed stenosis L5-S1 L s/p L5-S1 hemilaminectomy and facetectomy foraminotomy. PMH including Closed hip fracture (03/2016), HTN, Osteopenia, Osteoporosis, Pneumonia, and Skin cancer.    Clinical Impression   PTA, pt was living with her husband and was independent; enjoys cycling and is active. Currently, pt performing ADLs and functional mobility at Mod I level with increased time as needed. Provided education and handout on back precautions, sleep positioning, bed mobility, grooming, LB ADLs, toileting, and functional transfers; pt demonstrated and verbalized understanding. Answered all pt questions. Recommend dc home once medically stable per physician. All acute OT needs met and will sign off. Thank you.     Follow Up Recommendations  No OT follow up    Equipment Recommendations  None recommended by OT    Recommendations for Other Services       Precautions / Restrictions Precautions Precautions: Back Precaution Booklet Issued: Yes (comment) Precaution Comments: Reviewed all back precautions and technqiues for adherance during ADLs      Mobility Bed Mobility               General bed mobility comments: Pt sitting at EOB. Educating pt on log roll technique and she verablized understanding  Transfers Overall transfer level: Needs assistance   Transfers: Sit to/from Stand Sit to Stand: Supervision         General transfer comment: supervision for initial safety    Balance Overall balance assessment: No apparent balance deficits (not formally assessed)                                         ADL either performed or assessed with clinical judgement   ADL Overall ADL's : Modified  independent                                       General ADL Comments: Pt performing ADLs and functional mobiltiy at Mod I level with increased time as needed. Providing pt with handout and education on back precautions, sleep positioning, LB ADLs, bathing, toileting, and functional transfers. Pt demonstrated and verablized understanding.      Vision Baseline Vision/History: Wears glasses Wears Glasses: Reading only Patient Visual Report: No change from baseline       Perception     Praxis      Pertinent Vitals/Pain Pain Assessment: Faces Faces Pain Scale: Hurts a little bit Pain Location: Back Pain Descriptors / Indicators: Constant Pain Intervention(s): Monitored during session     Hand Dominance Right   Extremity/Trunk Assessment Upper Extremity Assessment Upper Extremity Assessment: Overall WFL for tasks assessed   Lower Extremity Assessment Lower Extremity Assessment: Overall WFL for tasks assessed   Cervical / Trunk Assessment Cervical / Trunk Assessment: Other exceptions Cervical / Trunk Exceptions: Back sx   Communication Communication Communication: No difficulties   Cognition Arousal/Alertness: Awake/alert Behavior During Therapy: WFL for tasks assessed/performed Overall Cognitive Status: Within Functional Limits for tasks assessed  General Comments       Exercises     Shoulder Instructions      Home Living Family/patient expects to be discharged to:: Private residence Living Arrangements: Spouse/significant other Available Help at Discharge: Family;Available 24 hours/day Type of Home: House Home Access: Stairs to enter [employer] of Steps: 3   Home Layout: One level     Bathroom Shower/Tub: Tub/shower unit;Walk-in shower   Bathroom Toilet: Handicapped height     Home Equipment: Shower seat          Prior Functioning/Environment Level of Independence:  Independent                 OT Problem List: Decreased activity tolerance;Decreased range of motion;Decreased knowledge of use of DME or AE;Decreased knowledge of precautions;Pain      OT Treatment/Interventions:      OT Goals(Current goals can be found in the care plan section) Acute Rehab OT Goals Patient Stated Goal: "Return to cycling" OT Goal Formulation: All assessment and education complete, DC therapy  OT Frequency:     Barriers to D/C:            Co-evaluation              AM-PAC OT "6 Clicks" Daily Activity     Outcome Measure Help from another person eating meals?: None Help from another person taking care of personal grooming?: None Help from another person toileting, which includes using toliet, bedpan, or urinal?: None Help from another person bathing (including washing, rinsing, drying)?: None Help from another person to put on and taking off regular upper body clothing?: None Help from another person to put on and taking off regular lower body clothing?: None 6 Click Score: 24   End of Session Nurse Communication: Mobility status  Activity Tolerance: Patient tolerated treatment well Patient left: in chair;with call bell/phone within reach;Other (comment)(with MD)  OT Visit Diagnosis: Unsteadiness on feet (R26.81);Pain;Muscle weakness (generalized) (M62.81) Pain - Right/Left: Left Pain - part of body: (Back)                Time: 8616-8372 OT Time Calculation (min): 22 min Charges:  OT General Charges $OT Visit: 1 Visit OT Evaluation $OT Eval Low Complexity: [place], OTR/L Acute Rehab Pager: [phone] Office: [place] 02/15/2019, 7:57 AM

## 2019-02-15 NOTE — Progress Notes (Signed)
Patient alert and oriented, mae's well, voiding adequate amount of urine, swallowing without difficulty, no c/o pain at time of discharge. Patient discharged home with family. Script and discharged instructions given to patient. Patient and family stated understanding of instructions given. Patient has an appointment with Dr. Jones °

## 2019-02-16 ENCOUNTER — Ambulatory Visit: Payer: PPO | Admitting: Family Medicine

## 2019-03-24 DIAGNOSIS — M5416 Radiculopathy, lumbar region: Secondary | ICD-10-CM | POA: Diagnosis not present

## 2019-03-24 DIAGNOSIS — L659 Nonscarring hair loss, unspecified: Secondary | ICD-10-CM | POA: Diagnosis not present

## 2019-03-24 DIAGNOSIS — R600 Localized edema: Secondary | ICD-10-CM | POA: Diagnosis not present

## 2019-03-24 DIAGNOSIS — E7800 Pure hypercholesterolemia, unspecified: Secondary | ICD-10-CM | POA: Diagnosis not present

## 2019-04-04 DIAGNOSIS — D485 Neoplasm of uncertain behavior of skin: Secondary | ICD-10-CM | POA: Diagnosis not present

## 2019-04-04 DIAGNOSIS — D225 Melanocytic nevi of trunk: Secondary | ICD-10-CM | POA: Diagnosis not present

## 2019-04-04 DIAGNOSIS — L821 Other seborrheic keratosis: Secondary | ICD-10-CM | POA: Diagnosis not present

## 2019-04-04 DIAGNOSIS — C44519 Basal cell carcinoma of skin of other part of trunk: Secondary | ICD-10-CM | POA: Diagnosis not present

## 2019-04-04 DIAGNOSIS — D1801 Hemangioma of skin and subcutaneous tissue: Secondary | ICD-10-CM | POA: Diagnosis not present

## 2019-04-04 DIAGNOSIS — M6740 Ganglion, unspecified site: Secondary | ICD-10-CM | POA: Diagnosis not present

## 2019-04-04 DIAGNOSIS — D2271 Melanocytic nevi of right lower limb, including hip: Secondary | ICD-10-CM | POA: Diagnosis not present

## 2019-04-04 DIAGNOSIS — D0461 Carcinoma in situ of skin of right upper limb, including shoulder: Secondary | ICD-10-CM | POA: Diagnosis not present

## 2019-04-04 DIAGNOSIS — Z85820 Personal history of malignant melanoma of skin: Secondary | ICD-10-CM | POA: Diagnosis not present

## 2019-04-04 DIAGNOSIS — Z85828 Personal history of other malignant neoplasm of skin: Secondary | ICD-10-CM | POA: Diagnosis not present

## 2019-04-12 DIAGNOSIS — D0461 Carcinoma in situ of skin of right upper limb, including shoulder: Secondary | ICD-10-CM | POA: Diagnosis not present

## 2019-04-12 DIAGNOSIS — Z85828 Personal history of other malignant neoplasm of skin: Secondary | ICD-10-CM | POA: Diagnosis not present

## 2019-04-12 DIAGNOSIS — Z85820 Personal history of malignant melanoma of skin: Secondary | ICD-10-CM | POA: Diagnosis not present

## 2019-04-12 MED FILL — FOLIC ACID 1 MG TABS: 1 | 90 days supply | Qty: 90 | Fill #0

## 2019-04-12 MED FILL — AMLODIPINE BESYLATE 5 MG TA: 5 | 90 days supply | Qty: 90 | Fill #0

## 2019-04-12 MED FILL — ESTRADIOL 0.1 MG/GM CREA: 0.1 | 90 days supply | Qty: 43 | Fill #0

## 2019-04-19 DIAGNOSIS — M5126 Other intervertebral disc displacement, lumbar region: Secondary | ICD-10-CM | POA: Diagnosis not present

## 2019-04-20 DIAGNOSIS — M5126 Other intervertebral disc displacement, lumbar region: Secondary | ICD-10-CM | POA: Diagnosis not present

## 2019-04-20 DIAGNOSIS — M5127 Other intervertebral disc displacement, lumbosacral region: Secondary | ICD-10-CM | POA: Diagnosis not present

## 2019-04-28 DIAGNOSIS — Z0000 Encounter for general adult medical examination without abnormal findings: Secondary | ICD-10-CM | POA: Diagnosis not present

## 2019-04-28 DIAGNOSIS — Z1211 Encounter for screening for malignant neoplasm of colon: Secondary | ICD-10-CM | POA: Diagnosis not present

## 2019-04-28 DIAGNOSIS — Z1389 Encounter for screening for other disorder: Secondary | ICD-10-CM | POA: Diagnosis not present

## 2019-05-05 DIAGNOSIS — G629 Polyneuropathy, unspecified: Secondary | ICD-10-CM | POA: Diagnosis not present

## 2019-05-05 MED FILL — CYCLOBENZAPRINE 10 MG TAB: 10 | 20 days supply | Qty: 30 | Fill #1

## 2019-05-25 ENCOUNTER — Other Ambulatory Visit: Payer: Self-pay

## 2019-05-25 ENCOUNTER — Encounter: Payer: Self-pay | Admitting: Family Medicine

## 2019-05-25 ENCOUNTER — Ambulatory Visit: Payer: PPO | Admitting: Family Medicine

## 2019-05-25 DIAGNOSIS — M76892 Other specified enthesopathies of left lower limb, excluding foot: Secondary | ICD-10-CM | POA: Diagnosis not present

## 2019-05-25 NOTE — Patient Instructions (Addendum)
Good to see you Tracey [name] copper compression sleeve See you after 4th of July for a gluteal tendon injection

## 2019-05-25 NOTE — Assessment & Plan Note (Signed)
Patient will be treated for hamstring tendinitis.  Home exercises given, discussed topical anti-inflammatories and icing regimen.  Discussed compression sleeve.  Discussed avoiding impact on the tuberosity.  Worsening pain will consider the possibility of injection but I would like to avoid it.  Patient does have known lumbar radiculopathy and likely contributing to some of the discomfort and pain.

## 2019-05-25 NOTE — Progress Notes (Signed)
[name] [place] [place], [place] [address] Phone: [phone] Subjective:   I Tracey [name] am serving as a Education administrator for Dr. Hulan Saas.   CC: Back pain and buttocks pain follow-up  NAT:[id number]  Tracey [name] is a 68 y.o. female coming in with complaint of back pain. States that she is doing well. Tight in some areas.  Patient's wound did have back surgery in March.  Continued to have pain.  Patient does feel that it did help some of the back pain overall but continues to have buttocks pain with some radicular symptoms down the left leg.  Patient is following up with the pain management physician to discuss other type of injections.  Patient states most of her pain seems to be near the piriformis on the left side as well as the ischial tuberosity and in the mid substance of the hamstring on the left leg.  Patient states is some mild tingling in the toes but nothing that seems to stay all the time.     Past Medical History:  Diagnosis Date  . Closed hip fracture ([facility]) 03/2016   RT HIP  . Hypertension   . Osteopenia   . Osteoporosis    h/o  . Pneumonia   . Skin cancer    multiple skin cancers   Past Surgical History:  Procedure Laterality Date  . CALDWELL LUC    . COLONOSCOPY WITH PROPOFOL N/A 03/22/2014   Procedure: COLONOSCOPY WITH PROPOFOL;  Surgeon: Arta Silence, MD;  Location: [facility];  Service: Endoscopy;  Laterality: N/A;  . HIP PINNING,CANNULATED Right 03/21/2016   Procedure: CANNULATED HIP PINNING;  Surgeon: Rod Can, MD;  Location: [facility];  Service: Orthopedics;  Laterality: Right;  . LUMBAR LAMINECTOMY/DECOMPRESSION MICRODISCECTOMY Left 02/14/2019   Procedure: Laminectomy and Foraminotomy - L5-S1 - left;  Surgeon: Eustace Moore, MD;  Location: [facility];  Service: Neurosurgery;  Laterality: Left;  Laminectomy and Foraminotomy - L5-S1 - left  . NASAL SEPTUM SURGERY     past hx. deviated septum  . TONSILLECTOMY      Social History   Socioeconomic History  . Marital status: Married    Spouse name: [name]  . Number of children: 1  . Years of education: Not on file  . Highest education level: Not on file  Occupational History  . Occupation: MED TECH    Employer: [employer]  Social Needs  . Financial resource strain: Not on file  . Food insecurity    Worry: Not on file    Inability: Not on file  . Transportation needs    Medical: Not on file    Non-medical: Not on file  Tobacco Use  . Smoking status: Never Smoker  . Smokeless tobacco: Never Used  Substance and Sexual Activity  . Alcohol use: Not Currently    Alcohol/week: 0.3 standard drinks  . Drug use: No  . Sexual activity: Yes    Partners: Male    Comment: married, [place]  Lifestyle  . Physical activity    Days per week: Not on file    Minutes per session: Not on file  . Stress: Not on file  Relationships  . Social Herbalist on phone: Not on file    Gets together: Not on file    Attends religious service: Not on file    Active member of club or organization: Not on file    Attends meetings of clubs or  organizations: Not on file    Relationship status: Not on file  Other Topics Concern  . Not on file  Social History Narrative  . Not on file   Allergies  Allergen Reactions  . Adhesive [Tape]   . Cefaclor Hives  . Fosamax [Alendronate Sodium] Other (See Comments)    Gi upset  . Neomycin-Bacitracin Zn-Polymyx Rash   Family History  Problem Relation Age of Onset  . Hypertension Mother   . Leukemia Father   . Hypertension Father   . Diabetes Brother   . Hypertension Brother   . Hyperlipidemia Maternal Grandmother   . Cancer Maternal Grandfather   . Heart disease Paternal Grandfather   . Stroke Paternal Grandfather   . Breast cancer Maternal Aunt   . Breast cancer Paternal Aunt     Current Outpatient Medications (Endocrine & Metabolic):  Marland Kitchen  Zoledronic Acid (RECLAST IV), Inject 1 Dose into the  vein once. Once a year  Current Outpatient Medications (Cardiovascular):  .  amLODipine (NORVASC) 5 MG tablet, Take 5 mg by mouth every evening.   Current Outpatient Medications (Analgesics):  .  acetaminophen (TYLENOL) 500 MG tablet, Take 1,000 mg by mouth every 6 (six) hours as needed (for pain.). Marland Kitchen  aspirin EC 81 MG tablet, Take 81 mg by mouth every evening. Marland Kitchen  ibuprofen (ADVIL,MOTRIN) 200 MG tablet, Take 400 mg by mouth every 8 (eight) hours as needed (pain). .  traMADol (ULTRAM) 50 MG tablet, Take 1 tablet (50 mg total) by mouth every 6 (six) hours as needed.  Current Outpatient Medications (Hematological):  .  folic acid (FOLVITE) 1 MG tablet, Take 1 mg by mouth daily.    Current Outpatient Medications (Other):  .  cholecalciferol (VITAMIN D) 25 MCG (1000 UT) tablet, Take 1,000 Units by mouth daily. .  cyclobenzaprine (FLEXERIL) 10 MG tablet, Take 0.5 tablets (5 mg total) by mouth 3 (three) times daily as needed. Marland Kitchen  ESTRACE VAGINAL 0.1 MG/GM vaginal cream, Place 1 Applicatorful vaginally 2 (two) times a week. Sundays & Wednesdays. .  fish oil-omega-3 fatty acids 1000 MG capsule, Take 1,000 mg by mouth 2 (two) times daily.  Marland Kitchen  gabapentin (NEURONTIN) 400 MG capsule, Take 400 mg by mouth at bedtime. .  metroNIDAZOLE (METROGEL) 1 % gel, Apply 1 application topically 2 (two) times daily. Applied to face .  Multiple Minerals-Vitamins (CAL MAG ZINC +D3 PO), Take 2 tablets by mouth 2 (two) times daily. .  Multiple Vitamin (MULTIVITAMIN WITH MINERALS) TABS tablet, Take 1 tablet by mouth daily. Marland Kitchen  ofloxacin (OCUFLOX) 0.3 % ophthalmic solution, Place 1 drop into both eyes 4 (four) times daily.  .  psyllium (METAMUCIL) 58.6 % packet, Take 1 packet by mouth daily.    Past medical history, social, surgical and family history all reviewed in electronic medical record.  No pertanent information unless stated regarding to the chief complaint.   Review of Systems:  No headache, visual changes,  nausea, vomiting, diarrhea, constipation, dizziness, abdominal pain, skin rash, fevers, chills, night sweats, weight loss, swollen lymph nodes, body aches, joint swelling, , chest pain, shortness of breath, mood changes.  Positive muscle aches  Objective  Blood pressure 120/90, pulse 76, height 5\' 6"  (1.676 m), weight 106 lb (48.1 kg), SpO2 99 %.    General: No apparent distress alert and oriented x3 mood and affect normal, dressed appropriately.  HEENT: Pupils equal, extraocular movements intact  Respiratory: Patient's speak in full sentences and does not appear short of breath  Cardiovascular:  No lower extremity edema, non tender, no erythema  Skin: Warm dry intact with no signs of infection or rash on extremities or on axial skeleton.  Abdomen: Soft nontender  Neuro: Cranial nerves II through XII are intact, neurovascularly intact in all extremities with 2+ DTRs and 2+ pulses.  Lymph: No lymphadenopathy of posterior or anterior cervical chain or axillae bilaterally.  Gait mild antalgic MSK:  Non tender with full range of motion and good stability and symmetric strength and tone of shoulders, elbows, wrist, hip, knee and ankles bilaterally.    Back exam has some mild loss of lordosis with some mild degenerative scoliosis.  Patient does have tightness of the hamstring on the left compared to the right.  Possibly positive straight leg test but difficult to assess with patient's tightness of the hamstring.  Neurovascular intact distally with 5 out of 5 strength. Patient is tender to palpation in the hamstring itself as well as the ischial tuberosity.    Impression and Recommendations:     This case required medical decision making of moderate complexity. The above documentation has been reviewed and is accurate and complete [name], DO       Note: This dictation was prepared with Dragon dictation along with smaller phrase technology. Any transcriptional errors that result from  this process are unintentional.

## 2019-05-26 DIAGNOSIS — M779 Enthesopathy, unspecified: Secondary | ICD-10-CM | POA: Diagnosis not present

## 2019-05-26 DIAGNOSIS — M79605 Pain in left leg: Secondary | ICD-10-CM | POA: Diagnosis not present

## 2019-05-26 DIAGNOSIS — M7072 Other bursitis of hip, left hip: Secondary | ICD-10-CM | POA: Diagnosis not present

## 2019-05-26 DIAGNOSIS — M5126 Other intervertebral disc displacement, lumbar region: Secondary | ICD-10-CM | POA: Diagnosis not present

## 2019-05-26 MED FILL — PEG-3350 SOLUTION: 420 | 1 days supply | Qty: 4000 | Fill #0

## 2019-06-01 ENCOUNTER — Encounter: Payer: Self-pay | Admitting: *Deleted

## 2019-06-01 ENCOUNTER — Encounter: Payer: Self-pay | Admitting: Family Medicine

## 2019-06-15 ENCOUNTER — Encounter: Payer: Self-pay | Admitting: Family Medicine

## 2019-06-15 ENCOUNTER — Other Ambulatory Visit: Payer: Self-pay

## 2019-06-15 ENCOUNTER — Ambulatory Visit (INDEPENDENT_AMBULATORY_CARE_PROVIDER_SITE_OTHER): Payer: PPO | Admitting: Family Medicine

## 2019-06-15 DIAGNOSIS — M76892 Other specified enthesopathies of left lower limb, excluding foot: Secondary | ICD-10-CM | POA: Diagnosis not present

## 2019-06-15 NOTE — Patient Instructions (Addendum)
Good to see you 2XU compression sleeve

## 2019-06-15 NOTE — Progress Notes (Signed)
[name] [address] [place], [place] [address] Phone: [phone] Subjective:   I Tracey [name] am serving as a Education administrator for Dr. Hulan Saas.   CC: Back pain follow-up  FBP:ZWCHENIDPO   05/25/2019: Patient will be treated for hamstring tendinitis.  Home exercises given, discussed topical anti-inflammatories and icing regimen.  Discussed compression sleeve.  Discussed avoiding impact on the tuberosity.  Worsening pain will consider the possibility of injection but I would like to avoid it.  Patient does have known lumbar radiculopathy and likely contributing to some of the discomfort and pain.  Update 06/15/2019: Tracey [name] is a 68 y.o. female coming in with complaint of hamstring pain. States she hasn't done exercises consistently. Could not find the correct compression sleeve. Has had skin issues with body helix.  Still having some mild discomfort and pain.  Nothing no severe.  Patient still has tightness of the hamstrings but still some mild radicular symptoms down the leg.  Not stopping her from activities and states the severity of pain is 3 out of 10    Past Medical History:  Diagnosis Date  . Closed hip fracture ([place]) 03/2016   RT HIP  . Hypertension   . Osteopenia   . Osteoporosis    h/o  . Pneumonia   . Skin cancer    multiple skin cancers   Past Surgical History:  Procedure Laterality Date  . CALDWELL LUC    . COLONOSCOPY WITH PROPOFOL N/A 03/22/2014   Procedure: COLONOSCOPY WITH PROPOFOL;  Surgeon: Arta Silence, MD;  Location: [facility];  Service: Endoscopy;  Laterality: N/A;  . HIP PINNING,CANNULATED Right 03/21/2016   Procedure: CANNULATED HIP PINNING;  Surgeon: Rod Can, MD;  Location: [facility];  Service: Orthopedics;  Laterality: Right;  . LUMBAR LAMINECTOMY/DECOMPRESSION MICRODISCECTOMY Left 02/14/2019   Procedure: Laminectomy and Foraminotomy - L5-S1 - left;  Surgeon: Eustace Moore, MD;  Location: [facility];  Service:  Neurosurgery;  Laterality: Left;  Laminectomy and Foraminotomy - L5-S1 - left  . NASAL SEPTUM SURGERY     past hx. deviated septum  . TONSILLECTOMY     Social History   Socioeconomic History  . Marital status: Married    Spouse name: [name]  . Number of children: 1  . Years of education: Not on file  . Highest education level: Not on file  Occupational History  . Occupation: MED TECH    Employer: [employer]  Social Needs  . Financial resource strain: Not on file  . Food insecurity    Worry: Not on file    Inability: Not on file  . Transportation needs    Medical: Not on file    Non-medical: Not on file  Tobacco Use  . Smoking status: Never Smoker  . Smokeless tobacco: Never Used  Substance and Sexual Activity  . Alcohol use: Not Currently    Alcohol/week: 0.3 standard drinks  . Drug use: No  . Sexual activity: Yes    Partners: Male    Comment: married, [place]  Lifestyle  . Physical activity    Days per week: Not on file    Minutes per session: Not on file  . Stress: Not on file  Relationships  . Social Herbalist on phone: Not on file    Gets together: Not on file    Attends religious service: Not on file    Active member of club or organization: Not on file  Attends meetings of clubs or organizations: Not on file    Relationship status: Not on file  Other Topics Concern  . Not on file  Social History Narrative  . Not on file   Allergies  Allergen Reactions  . Adhesive [Tape]   . Cefaclor Hives  . Fosamax [Alendronate Sodium] Other (See Comments)    Gi upset  . Neomycin-Bacitracin Zn-Polymyx Rash   Family History  Problem Relation Age of Onset  . Hypertension Mother   . Leukemia Father   . Hypertension Father   . Diabetes Brother   . Hypertension Brother   . Hyperlipidemia Maternal Grandmother   . Cancer Maternal Grandfather   . Heart disease Paternal Grandfather   . Stroke Paternal Grandfather   . Breast cancer Maternal Aunt    . Breast cancer Paternal Aunt     Current Outpatient Medications (Endocrine & Metabolic):  Marland Kitchen  Zoledronic Acid (RECLAST IV), Inject 1 Dose into the vein once. Once a year  Current Outpatient Medications (Cardiovascular):  .  amLODipine (NORVASC) 5 MG tablet, Take 5 mg by mouth every evening.   Current Outpatient Medications (Analgesics):  .  acetaminophen (TYLENOL) 500 MG tablet, Take 1,000 mg by mouth every 6 (six) hours as needed (for pain.). Marland Kitchen  aspirin EC 81 MG tablet, Take 81 mg by mouth every evening. Marland Kitchen  ibuprofen (ADVIL,MOTRIN) 200 MG tablet, Take 400 mg by mouth every 8 (eight) hours as needed (pain). .  traMADol (ULTRAM) 50 MG tablet, Take 1 tablet (50 mg total) by mouth every 6 (six) hours as needed.  Current Outpatient Medications (Hematological):  .  folic acid (FOLVITE) 1 MG tablet, Take 1 mg by mouth daily.    Current Outpatient Medications (Other):  .  cholecalciferol (VITAMIN D) 25 MCG (1000 UT) tablet, Take 1,000 Units by mouth daily. .  cyclobenzaprine (FLEXERIL) 10 MG tablet, Take 0.5 tablets (5 mg total) by mouth 3 (three) times daily as needed. Marland Kitchen  ESTRACE VAGINAL 0.1 MG/GM vaginal cream, Place 1 Applicatorful vaginally 2 (two) times a week. Sundays & Wednesdays. .  fish oil-omega-3 fatty acids 1000 MG capsule, Take 1,000 mg by mouth 2 (two) times daily.  Marland Kitchen  gabapentin (NEURONTIN) 400 MG capsule, Take 400 mg by mouth at bedtime. .  metroNIDAZOLE (METROGEL) 1 % gel, Apply 1 application topically 2 (two) times daily. Applied to face .  Multiple Minerals-Vitamins (CAL MAG ZINC +D3 PO), Take 2 tablets by mouth 2 (two) times daily. .  Multiple Vitamin (MULTIVITAMIN WITH MINERALS) TABS tablet, Take 1 tablet by mouth daily. Marland Kitchen  ofloxacin (OCUFLOX) 0.3 % ophthalmic solution, Place 1 drop into both eyes 4 (four) times daily.  .  psyllium (METAMUCIL) 58.6 % packet, Take 1 packet by mouth daily.    Past medical history, social, surgical and family history all reviewed in  electronic medical record.  No pertanent information unless stated regarding to the chief complaint.   Review of Systems:  No headache, visual changes, nausea, vomiting, diarrhea, constipation, dizziness, abdominal pain, skin rash, fevers, chills, night sweats, weight loss, swollen lymph nodes, body aches, joint swelling,  chest pain, shortness of breath, mood changes.  Positive muscle aches  Objective  Blood pressure 110/70, pulse 80, height 5\' 6"  (1.676 m), weight 106 lb (48.1 kg), SpO2 98 %.   General: No apparent distress alert and oriented x3 mood and affect normal, dressed appropriately.  HEENT: Pupils equal, extraocular movements intact  Respiratory: Patient's speak in full sentences and does not appear short  of breath  Cardiovascular: No lower extremity edema, non tender, no erythema  Skin: Warm dry intact with no signs of infection or rash on extremities or on axial skeleton.  Abdomen: Soft nontender  Neuro: Cranial nerves II through XII are intact, neurovascularly intact in all extremities with 2+ DTRs and 2+ pulses.  Lymph: No lymphadenopathy of posterior or anterior cervical chain or axillae bilaterally.  Gait mild antalgic MSK:  tender with full range of motion and good stability and symmetric strength and tone of shoulders, elbows, wrist, hip, knee and ankles bilaterally.  Mild arthritic changes of multiple joints    Impression and Recommendations:     . The above documentation has been reviewed and is accurate and complete [name], DO       Note: This dictation was prepared with Dragon dictation along with smaller phrase technology. Any transcriptional errors that result from this process are unintentional.

## 2019-06-15 NOTE — Assessment & Plan Note (Signed)
Patient has signs and symptoms are consistent with more of a tightness in the hamstring.  I am concerned for more of a lumbar radiculopathy that is likely contributing.  Discussed with her about the possibility of formal physical therapy which patient declined.  Patient will consider the possibility of an injection. Do not want to with her only having some mild discomfort and nothing that is stopping her from activity at this point.  Patient will continue with conservative therapy and make no significant changes with the medications.  Follow-up again in 4 to 8 weeks

## 2019-06-30 DIAGNOSIS — Z86010 Personal history of colonic polyps: Secondary | ICD-10-CM | POA: Diagnosis not present

## 2019-06-30 DIAGNOSIS — K5730 Diverticulosis of large intestine without perforation or abscess without bleeding: Secondary | ICD-10-CM | POA: Diagnosis not present

## 2019-07-12 MED FILL — FOLIC ACID 1 MG TABS: 1 | 90 days supply | Qty: 90 | Fill #1

## 2019-07-12 MED FILL — AMLODIPINE BESYLATE 5 MG TA: 5 | 90 days supply | Qty: 90 | Fill #1

## 2019-07-13 ENCOUNTER — Ambulatory Visit: Payer: 59 | Admitting: Obstetrics & Gynecology

## 2019-07-13 MED FILL — metroNIDAZOLE 1 % GEL: 1 | 14 days supply | Qty: 60 | Fill #1

## 2019-08-02 ENCOUNTER — Other Ambulatory Visit: Payer: Self-pay

## 2019-08-03 ENCOUNTER — Encounter: Payer: Self-pay | Admitting: Obstetrics & Gynecology

## 2019-08-03 ENCOUNTER — Ambulatory Visit (INDEPENDENT_AMBULATORY_CARE_PROVIDER_SITE_OTHER): Payer: PPO | Admitting: Obstetrics & Gynecology

## 2019-08-03 VITALS — BP 132/76 | Ht 65.25 in | Wt 106.0 lb

## 2019-08-03 DIAGNOSIS — Z01419 Encounter for gynecological examination (general) (routine) without abnormal findings: Secondary | ICD-10-CM | POA: Diagnosis not present

## 2019-08-03 DIAGNOSIS — Z124 Encounter for screening for malignant neoplasm of cervix: Secondary | ICD-10-CM

## 2019-08-03 DIAGNOSIS — M810 Age-related osteoporosis without current pathological fracture: Secondary | ICD-10-CM

## 2019-08-03 DIAGNOSIS — N952 Postmenopausal atrophic vaginitis: Secondary | ICD-10-CM

## 2019-08-03 DIAGNOSIS — Z780 Asymptomatic menopausal state: Secondary | ICD-10-CM

## 2019-08-03 MED ORDER — ESTRACE 0.1 MG/GM VA CREA: 0.5000 | TOPICAL_CREAM | VAGINAL | 4 refills | Status: DC

## 2019-08-03 NOTE — Patient Instructions (Signed)
1. Encounter for routine gynecological examination with Papanicolaou smear of cervix Normal gynecologic exam in menopause.  Pap reflex done.  Breast exam normal.  Screening mammogram October 2019 was negative.  Colonoscopy in 2015.  Body mass index low at 17.5.  Recommend to increase calories in her diet.  Continue with fitness.  Health labs with family physician.  2. Postmenopause Well on no hormone replacement therapy.  No postmenopausal bleeding.  3. Postmenopausal atrophic vaginitis Well on Estrace cream.  No contraindication to continue.  Prescription sent to pharmacy.  Will also use coconut oil as needed.  4. Age-related osteoporosis without current pathological fracture Osteoporosis on Reclast.  Followed by endocrinology.  Continue with vitamin D supplements, calcium intake of 1200 mg daily and regular weightbearing physical activities.  Other orders - b complex vitamins tablet; Take 1 tablet by mouth daily. - Biotin 1 MG CAPS; Take by mouth. - ESTRACE VAGINAL 0.1 MG/GM vaginal cream; Place 0.5 Applicatorfuls vaginally 2 (two) times a week. Sundays & Wednesdays.  Gabriel, it was a pleasure meeting you today!  I will inform you of your results as soon as they are available.

## 2019-08-03 NOTE — Progress Notes (Signed)
Tracey [name] March 14, 1951 [id number]   History:    68 y.o. G1P1L1 Married.  Dauhgter is 70 yo, has 2 boys.  RP:  New patient presenting for annual gyn exam   HPI: Small hematometra, EBx 10/2018 Benign.  Postmenopause, well on no HRT.  No PMB.  No pelvic pain.  No pain with IC on Estrace cream.  Urine/BMs normal.  Breasts normal.  BMI 17.5.  Walking. Health labs with Fam MD.  Past medical history,surgical history, family history and social history were all reviewed and documented in the EPIC chart.  Gynecologic History No LMP recorded. Patient is postmenopausal. Contraception: post menopausal status Last Pap: 05/2017. Results were: Negative/HPV HR neg Last mammogram: 08/2018. Results were: Negative Bone Density: Bone Density Osteoporosis on Reclast.  Followed by Endocrino. Colonoscopy: 2015  Obstetric History OB History  Gravida Para Term Preterm AB Living  1 1       1   SAB TAB Ectopic Multiple Live Births               # Outcome Date GA Lbr Len/2nd Weight Sex Delivery Anes PTL Lv  1 Para 1978    F Vag-Spont        ROS: A ROS was performed and pertinent positives and negatives are included in the history.  GENERAL: No fevers or chills. HEENT: No change in vision, no earache, sore throat or sinus congestion. NECK: No pain or stiffness. CARDIOVASCULAR: No chest pain or pressure. No palpitations. PULMONARY: No shortness of breath, cough or wheeze. GASTROINTESTINAL: No abdominal pain, nausea, vomiting or diarrhea, melena or bright red blood per rectum. GENITOURINARY: No urinary frequency, urgency, hesitancy or dysuria. MUSCULOSKELETAL: No joint or muscle pain, no back pain, no recent trauma. DERMATOLOGIC: No rash, no itching, no lesions. ENDOCRINE: No polyuria, polydipsia, no heat or cold intolerance. No recent change in weight. HEMATOLOGICAL: No anemia or easy bruising or bleeding. NEUROLOGIC: No headache, seizures, numbness, tingling or weakness. PSYCHIATRIC: No depression, no loss  of interest in normal activity or change in sleep pattern.     Exam:   BP 132/76   Ht 5' 5.25" (1.657 m)   Wt 106 lb (48.1 kg)   BMI 17.50 kg/m   Body mass index is 17.5 kg/m.  General appearance : Well developed well nourished female. No acute distress HEENT: Eyes: no retinal hemorrhage or exudates,  Neck supple, trachea midline, no carotid bruits, no thyroidmegaly Lungs: Clear to auscultation, no rhonchi or wheezes, or rib retractions  Heart: Regular rate and rhythm, no murmurs or gallops Breast:Examined in sitting and supine position were symmetrical in appearance, no palpable masses or tenderness,  no skin retraction, no nipple inversion, no nipple discharge, no skin discoloration, no axillary or supraclavicular lymphadenopathy Abdomen: no palpable masses or tenderness, no rebound or guarding Extremities: no edema or skin discoloration or tenderness  Pelvic: Vulva: Normal             Vagina: No gross lesions or discharge  Cervix: No gross lesions or discharge.  Pap reflex done.  Uterus  AV, normal size, shape and consistency, non-tender and mobile  Adnexa  Without masses or tenderness  Anus: Normal   Assessment/Plan:  68 y.o. female for annual exam   1. Encounter for routine gynecological examination with Papanicolaou smear of cervix Normal gynecologic exam in menopause.  Pap reflex done.  Breast exam normal.  Screening mammogram October 2019 was negative.  Colonoscopy in 2015.  Body mass index low at 17.5.  Recommend  to increase calories in her diet.  Continue with fitness.  Health labs with family physician.  2. Postmenopause Well on no hormone replacement therapy.  No postmenopausal bleeding.  3. Postmenopausal atrophic vaginitis Well on Estrace cream.  No contraindication to continue.  Prescription sent to pharmacy.  Will also use coconut oil as needed.  4. Age-related osteoporosis without current pathological fracture Osteoporosis on Reclast.  Followed by  endocrinology.  Continue with vitamin D supplements, calcium intake of 1200 mg daily and regular weightbearing physical activities.  Other orders - b complex vitamins tablet; Take 1 tablet by mouth daily. - Biotin 1 MG CAPS; Take by mouth. - ESTRACE VAGINAL 0.1 MG/GM vaginal cream; Place 0.5 Applicatorfuls vaginally 2 (two) times a week. Sundays & Wednesdays.  Princess Bruins MD, 2:41 PM 08/03/2019

## 2019-08-04 LAB — PAP IG W/ RFLX HPV ASCU

## 2019-08-05 ENCOUNTER — Telehealth: Payer: Self-pay | Admitting: *Deleted

## 2019-08-05 ENCOUNTER — Encounter: Payer: Self-pay | Admitting: *Deleted

## 2019-08-05 MED ORDER — ESTRADIOL 0.1 MG/GM VA CREA: TOPICAL_CREAM | VAGINAL | 4 refills | Status: DC

## 2019-08-05 MED FILL — GABAPENTIN 400 MG CAPSULE: 400 | 90 days supply | Qty: 270 | Fill #0

## 2019-08-05 MED FILL — ESTRADIOL 0.1 MG/GM CREA: 0.1 | 90 days supply | Qty: 43 | Fill #0

## 2019-08-05 NOTE — Telephone Encounter (Signed)
PA done via cover my meds for estrace vaginal cream, will wait for response.

## 2019-08-05 NOTE — Telephone Encounter (Signed)
Patient called back and said Dr.Lavoie sent in Brand Estrace and she would like generic sent, maybe that's why PA was needed. Rx sent in for generic estradiol vaginal cream 0.1mg .

## 2019-08-10 NOTE — Telephone Encounter (Signed)
Medication approved through 12/01/19.

## 2019-09-20 ENCOUNTER — Other Ambulatory Visit: Payer: Self-pay | Admitting: Obstetrics & Gynecology

## 2019-09-20 DIAGNOSIS — Z1231 Encounter for screening mammogram for malignant neoplasm of breast: Secondary | ICD-10-CM

## 2019-09-20 MED FILL — CYCLOBENZAPRINE 10 MG TAB: 10 | 60 days supply | Qty: 30 | Fill #1

## 2019-09-28 DIAGNOSIS — Z23 Encounter for immunization: Secondary | ICD-10-CM | POA: Diagnosis not present

## 2019-10-10 DIAGNOSIS — Z85828 Personal history of other malignant neoplasm of skin: Secondary | ICD-10-CM | POA: Diagnosis not present

## 2019-10-10 DIAGNOSIS — D2271 Melanocytic nevi of right lower limb, including hip: Secondary | ICD-10-CM | POA: Diagnosis not present

## 2019-10-10 DIAGNOSIS — L570 Actinic keratosis: Secondary | ICD-10-CM | POA: Diagnosis not present

## 2019-10-10 DIAGNOSIS — L821 Other seborrheic keratosis: Secondary | ICD-10-CM | POA: Diagnosis not present

## 2019-10-10 DIAGNOSIS — D1801 Hemangioma of skin and subcutaneous tissue: Secondary | ICD-10-CM | POA: Diagnosis not present

## 2019-10-10 DIAGNOSIS — D225 Melanocytic nevi of trunk: Secondary | ICD-10-CM | POA: Diagnosis not present

## 2019-10-10 DIAGNOSIS — Z85820 Personal history of malignant melanoma of skin: Secondary | ICD-10-CM | POA: Diagnosis not present

## 2019-10-10 DIAGNOSIS — D692 Other nonthrombocytopenic purpura: Secondary | ICD-10-CM | POA: Diagnosis not present

## 2019-10-17 MED FILL — AMLODIPINE BESYLATE 5 MG TA: 5 | 90 days supply | Qty: 90 | Fill #0

## 2019-10-17 MED FILL — FOLIC ACID 1 MG TABS: 1 | 90 days supply | Qty: 90 | Fill #0

## 2019-11-02 ENCOUNTER — Other Ambulatory Visit: Payer: Self-pay

## 2019-11-02 ENCOUNTER — Ambulatory Visit
Admission: RE | Admit: 2019-11-02 | Discharge: 2019-11-02 | Disposition: A | Discharge status: Home / Self Care | Payer: PPO | Source: Ambulatory Visit | Attending: Obstetrics & Gynecology | Admitting: Obstetrics & Gynecology

## 2019-11-02 DIAGNOSIS — Z1231 Encounter for screening mammogram for malignant neoplasm of breast: Secondary | ICD-10-CM | POA: Diagnosis not present

## 2019-11-11 MED FILL — ESTRADIOL 0.1 MG/GM CREA: 0.1 | 90 days supply | Qty: 43 | Fill #1

## 2019-11-11 MED FILL — CYCLOBENZAPRINE 10 MG TAB: 10 | 60 days supply | Qty: 30 | Fill #2

## 2019-11-15 MED FILL — GABAPENTIN 400 MG CAPSULE: 400 | 90 days supply | Qty: 270 | Fill #1

## 2020-01-16 MED FILL — AMLODIPINE BESYLATE 5 MG TA: 5 | 90 days supply | Qty: 90 | Fill #1

## 2020-01-16 MED FILL — CYCLOBENZAPRINE HCL 10 MG T: 10 | 60 days supply | Qty: 30 | Fill #3

## 2020-01-16 MED FILL — FOLIC ACID 1 MG TABS: 1 | 90 days supply | Qty: 90 | Fill #1

## 2020-01-17 DIAGNOSIS — H5213 Myopia, bilateral: Secondary | ICD-10-CM | POA: Diagnosis not present

## 2020-01-17 DIAGNOSIS — Z961 Presence of intraocular lens: Secondary | ICD-10-CM | POA: Diagnosis not present

## 2020-01-17 DIAGNOSIS — H04123 Dry eye syndrome of bilateral lacrimal glands: Secondary | ICD-10-CM | POA: Diagnosis not present

## 2020-01-24 DIAGNOSIS — R636 Underweight: Secondary | ICD-10-CM | POA: Diagnosis not present

## 2020-01-24 DIAGNOSIS — Z7189 Other specified counseling: Secondary | ICD-10-CM | POA: Diagnosis not present

## 2020-01-24 DIAGNOSIS — M810 Age-related osteoporosis without current pathological fracture: Secondary | ICD-10-CM | POA: Diagnosis not present

## 2020-01-24 DIAGNOSIS — Z8781 Personal history of (healed) traumatic fracture: Secondary | ICD-10-CM | POA: Diagnosis not present

## 2020-02-07 MED FILL — ESTRADIOL 0.1 MG/GM CREA: 0.1 | 90 days supply | Qty: 43 | Fill #2

## 2020-02-08 ENCOUNTER — Other Ambulatory Visit (HOSPITAL_COMMUNITY): Payer: Self-pay

## 2020-02-08 ENCOUNTER — Encounter: Payer: Self-pay | Admitting: Family Medicine

## 2020-02-08 ENCOUNTER — Ambulatory Visit: Payer: PPO | Admitting: Family Medicine

## 2020-02-08 ENCOUNTER — Other Ambulatory Visit: Payer: Self-pay

## 2020-02-08 VITALS — BP 130/82 | Ht 66.5 in | Wt 110.0 lb

## 2020-02-08 DIAGNOSIS — S76112A Strain of left quadriceps muscle, fascia and tendon, initial encounter: Secondary | ICD-10-CM

## 2020-02-08 NOTE — Progress Notes (Signed)
PCP: Tracey Carol, MD  Subjective:   HPI: Patient is a 69 y.o. female here for evaluation of left thigh pain.  Patient has pain is been present for last week.  She initially injured her left thigh while taking a step down and landing forcefully on her left leg.  Pain is located on the anterior aspect of her left leg in the mid substance of her quad.  The pain has improved slightly over the last week.  She denies any bruising or swelling following the incident.  She denies any weakness following the injury.  She has no new numbness or tingling going down her leg.  Of note patient does have a history of osteoporosis.   Review of Systems: See HPI above.  Past Medical History:  Diagnosis Date  . Closed hip fracture ([facility]) 03/2016   RT HIP  . Hypertension   . Osteopenia   . Osteoporosis    h/o  . Pneumonia   . Skin cancer    multiple skin cancers    Current Outpatient Medications on File Prior to Visit  Medication Sig Dispense Refill  . acetaminophen (TYLENOL) 500 MG tablet Take 1,000 mg by mouth every 6 (six) hours as needed (for pain.).    Marland Kitchen amLODipine (NORVASC) 5 MG tablet Take 5 mg by mouth every evening.    Marland Kitchen aspirin EC 81 MG tablet Take 81 mg by mouth every evening.    Marland Kitchen b complex vitamins tablet Take 1 tablet by mouth daily.    . Biotin 1 MG CAPS Take by mouth.    . cholecalciferol (VITAMIN D) 25 MCG (1000 UT) tablet Take 1,000 Units by mouth daily.    . cyclobenzaprine (FLEXERIL) 10 MG tablet Take 0.5 tablets (5 mg total) by mouth 3 (three) times daily as needed. 30 tablet 1  . estradiol (ESTRACE VAGINAL) 0.1 MG/GM vaginal cream Insert  0.5 Applicatorfuls vaginally 2 (two) times a week. Sundays & Wednesdays 42.5 g 4  . fish oil-omega-3 fatty acids 1000 MG capsule Take 1,000 mg by mouth 2 (two) times daily.     . folic acid (FOLVITE) 1 MG tablet Take 1 mg by mouth daily.      Marland Kitchen gabapentin (NEURONTIN) 400 MG capsule Take 400 mg by mouth at bedtime.    Marland Kitchen ibuprofen (ADVIL,MOTRIN)  200 MG tablet Take 400 mg by mouth every 8 (eight) hours as needed (pain).    . metroNIDAZOLE (METROGEL) 1 % gel Apply 1 application topically 2 (two) times daily. Applied to face    . Multiple Minerals-Vitamins (CAL MAG ZINC +D3 PO) Take 2 tablets by mouth 2 (two) times daily.    . Multiple Vitamin (MULTIVITAMIN WITH MINERALS) TABS tablet Take 1 tablet by mouth daily.    . psyllium (METAMUCIL) 58.6 % packet Take 1 packet by mouth daily.    . Zoledronic Acid (RECLAST IV) Inject 1 Dose into the vein once. Once a year     No current facility-administered medications on file prior to visit.    Past Surgical History:  Procedure Laterality Date  . CALDWELL LUC    . COLONOSCOPY WITH PROPOFOL N/A 03/22/2014   Procedure: COLONOSCOPY WITH PROPOFOL;  Surgeon: Arta Silence, MD;  Location: [facility];  Service: Endoscopy;  Laterality: N/A;  . HIP PINNING,CANNULATED Right 03/21/2016   Procedure: CANNULATED HIP PINNING;  Surgeon: Rod Can, MD;  Location: [facility];  Service: Orthopedics;  Laterality: Right;  . HIP SURGERY    . LUMBAR LAMINECTOMY/DECOMPRESSION MICRODISCECTOMY Left 02/14/2019  Procedure: Laminectomy and Foraminotomy - L5-S1 - left;  Surgeon: Eustace Moore, MD;  Location: [facility];  Service: Neurosurgery;  Laterality: Left;  Laminectomy and Foraminotomy - L5-S1 - left  . NASAL SEPTUM SURGERY     past hx. deviated septum  . TONSILLECTOMY      Allergies  Allergen Reactions  . Adhesive [Tape]   . Cefaclor Hives  . Fosamax [Alendronate Sodium] Other (See Comments)    Gi upset  . Neomycin-Bacitracin Zn-Polymyx Rash    Social History   Socioeconomic History  . Marital status: Married    Spouse name: Tracey [name]  . Number of children: 1  . Years of education: Not on file  . Highest education level: Not on file  Occupational History  . Occupation: MED TECH    Employer: [employer]  Tobacco Use  . Smoking status: Never Smoker  . Smokeless tobacco: Never Used  Substance and  Sexual Activity  . Alcohol use: Not Currently    Alcohol/week: 0.3 standard drinks  . Drug use: No  . Sexual activity: Yes    Partners: Male    Comment: married, [place]  Other Topics Concern  . Not on file  Social History Narrative  . Not on file   Social Determinants of Health   Financial Resource Strain:   . Difficulty of Paying Living Expenses: Not on file  Food Insecurity:   . Worried About Charity fundraiser in the Last Year: Not on file  . Ran Out of Food in the Last Year: Not on file  Transportation Needs:   . Lack of Transportation (Medical): Not on file  . Lack of Transportation (Non-Medical): Not on file  Physical Activity:   . Days of Exercise per Week: Not on file  . Minutes of Exercise per Session: Not on file  Stress:   . Feeling of Stress : Not on file  Social Connections:   . Frequency of Communication with Friends and Family: Not on file  . Frequency of Social Gatherings with Friends and Family: Not on file  . Attends Religious Services: Not on file  . Active Member of Clubs or Organizations: Not on file  . Attends Archivist Meetings: Not on file  . Marital Status: Not on file  Intimate Partner Violence:   . Fear of Current or Ex-Partner: Not on file  . Emotionally Abused: Not on file  . Physically Abused: Not on file  . Sexually Abused: Not on file    Family History  Problem Relation Age of Onset  . Hypertension Mother   . Leukemia Father   . Hypertension Father   . Diabetes Brother   . Hypertension Brother   . Hyperlipidemia Maternal Grandmother   . Cancer Maternal Grandfather   . Heart disease Paternal Grandfather   . Stroke Paternal Grandfather   . Breast cancer Maternal Aunt   . Breast cancer Paternal Aunt         Objective:  Physical Exam: BP 130/82   Ht 5' 6.5" (1.689 m)   Wt 110 lb (49.9 kg)   BMI 17.49 kg/m  Gen: NAD, comfortable in exam room Lungs: Breathing comfortably on room air Inspection: No bruising or  swelling noted on the anterior thigh Palpation: Minimal tenderness palpation over the anterior thigh Range of motion: Normal range of motion noted at the hip and the knee Strength: 5/5 strength with hip flexion and knee extension however patient did have pain with resisted hip flexion Negative  single-leg hop test Negative fulcrum test   Assessment & Plan:  Patient is a 69 y.o. female here for evaluation of left thigh pain  1.  Left rectus femoris strain -Patient will purchase an over-the-counter compression sleeve to wear on right thigh for the next week.  She declined a body helix sleeve today as previous ones have made her itchy -Patient was given a home exercise program for quad strengthening -Patient may take over-the-counter anti-inflammatories as needed for pain  Patient will follow up on an as-needed basis

## 2020-02-08 NOTE — Patient Instructions (Signed)
The pain in your thigh is caused by strain to your rectus femoris muscle -You may wear the compression sleeve as needed for discomfort -You may take over-the-counter anti-inflammatories as needed for pain -Work on the exercises shown to her today for quad muscle strengthening and rehab  We will see her back as needed

## 2020-02-09 ENCOUNTER — Ambulatory Visit (HOSPITAL_COMMUNITY)
Admission: RE | Admit: 2020-02-09 | Discharge: 2020-02-09 | Disposition: A | Discharge status: Home / Self Care | Payer: PPO | Source: Ambulatory Visit | Attending: Internal Medicine | Admitting: Internal Medicine

## 2020-02-09 DIAGNOSIS — M810 Age-related osteoporosis without current pathological fracture: Secondary | ICD-10-CM | POA: Diagnosis not present

## 2020-02-09 IMAGING — MR MR LUMBAR SPINE W/O CM
5 series · 41 of 48 positions shown · non-contrast
Comparison: Lumbar MRI 02/21/2010.  Lumbar radiographs 07/19/2009.

CLINICAL DATA: 67-year-old female with pain radiating to the left
hip and leg for 3 months with no known injury. Previous right hip
surgery.

EXAM:
MRI LUMBAR SPINE WITHOUT CONTRAST
TECHNIQUE: Multiplanar, multisequence MR imaging of the lumbar spine was
performed. No intravenous contrast was administered.

[Series 3: T2 post-contrast · sagittal · 4.0mm · 0.88mm/px · 6 of 14 slices shown]
[im 1/14]
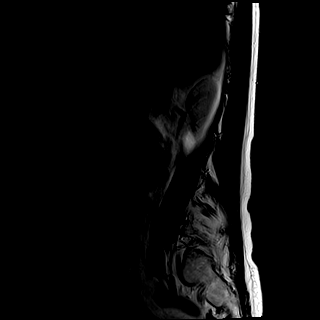
[im 3/14]
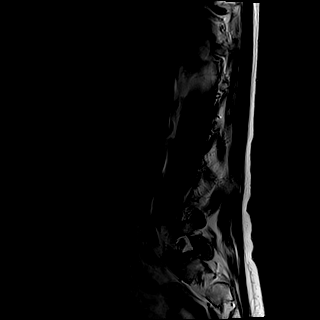
[im 6/14]
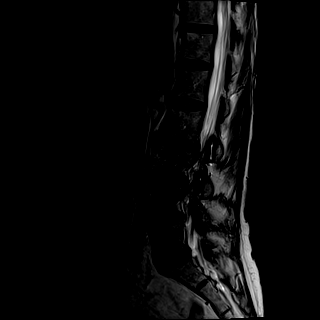
[im 8/14]
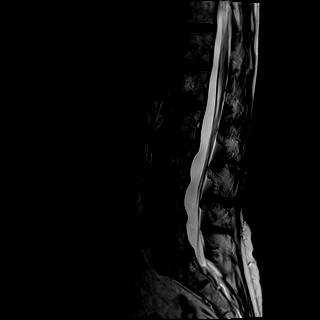
[im 11/14]
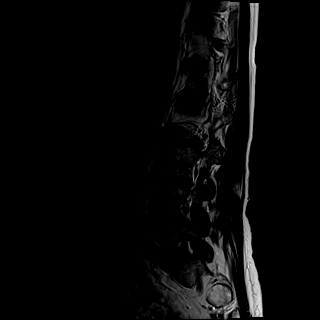
[im 14/14]
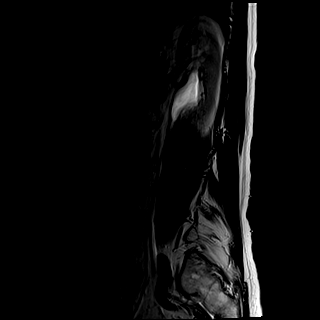

[Series 4: T1 · sagittal · 4.0mm · 0.88mm/px · 6 of 14 slices shown (1 of 2)]
[im 1/14]
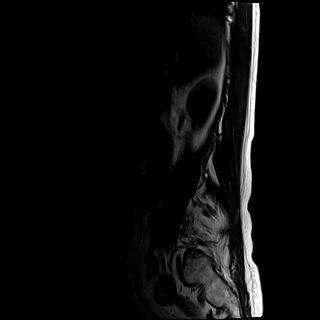
[im 3/14]
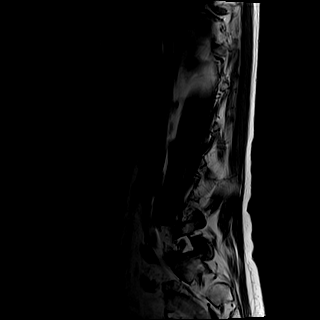
[im 6/14]
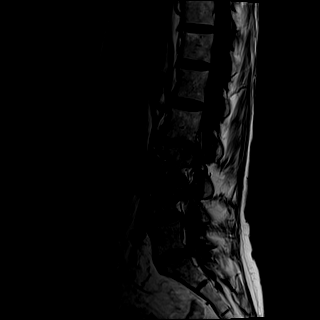
[im 8/14]
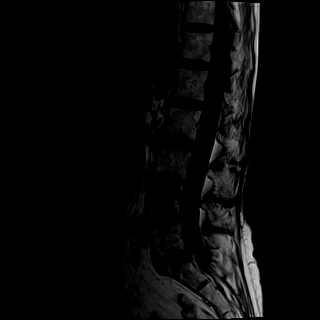
[im 11/14]
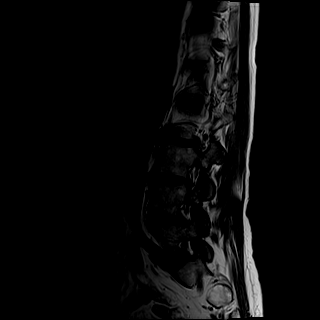
[im 14/14]
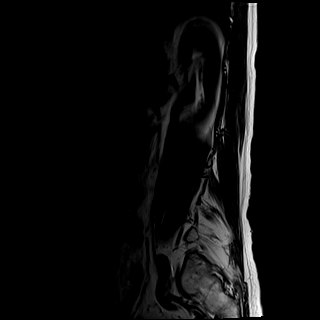

[Series 5: tirm sag · sagittal · 4.0mm · 0.55mm/px · 6 of 14 slices shown]
[im 1/14]
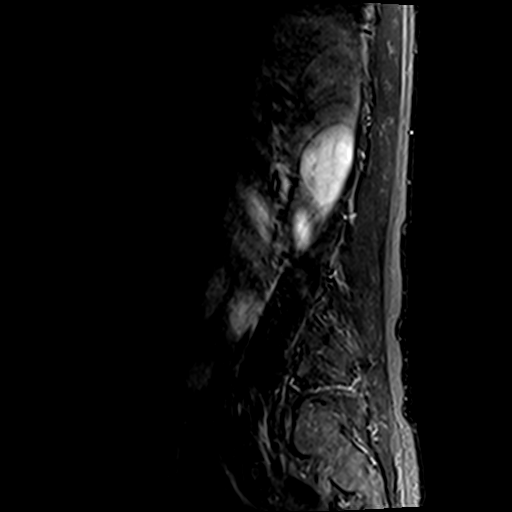
[im 3/14]
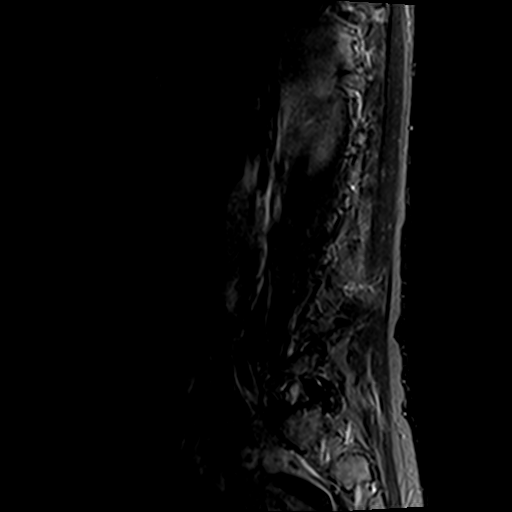
[im 6/14]
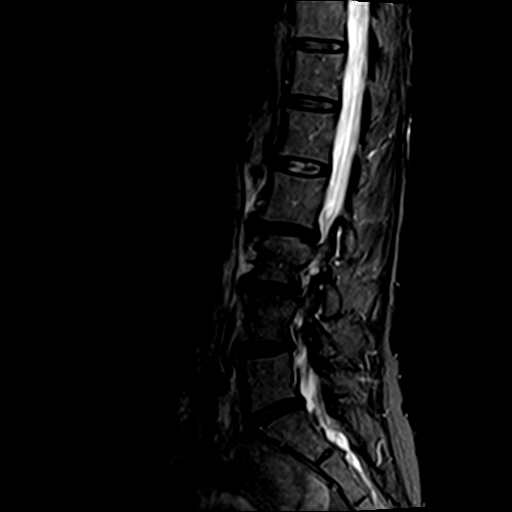
[im 8/14]
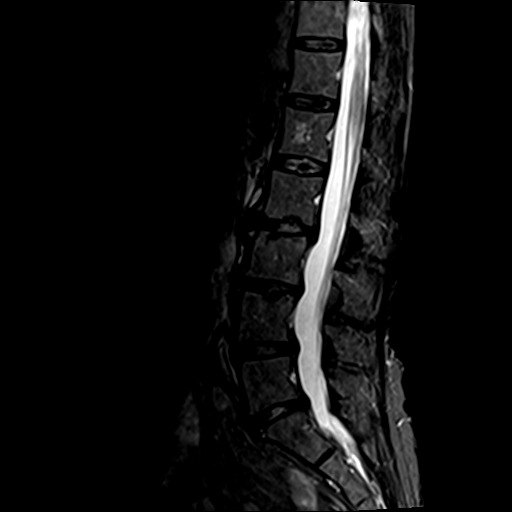
[im 11/14]
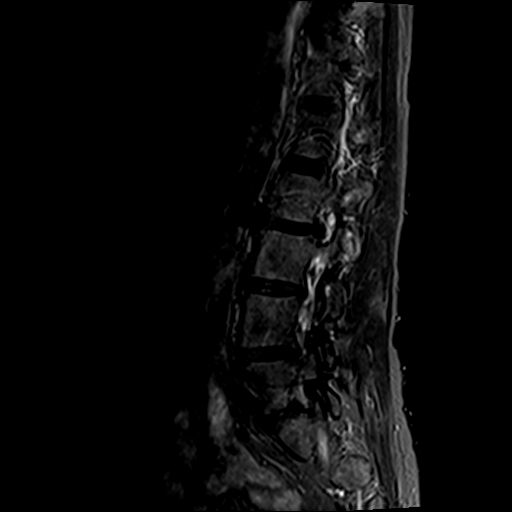
[im 14/14]
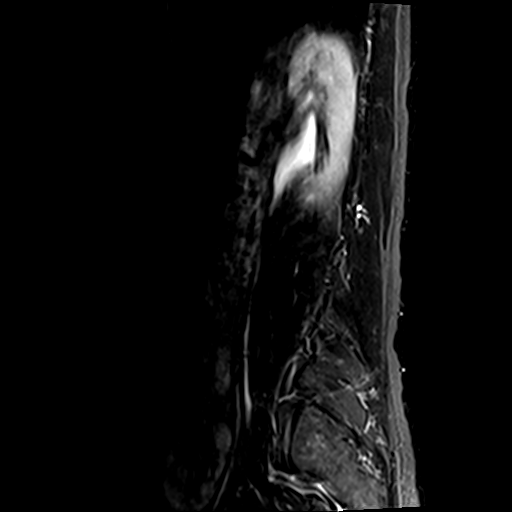

[Series 6: T1 · axial · 4.0mm · 0.78mm/px · z∈[-43,+157]mm · 9 of 35 slices shown (2 of 2)]
[im 1/35]
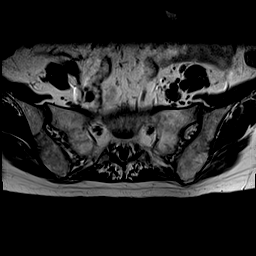
[im 5/35]
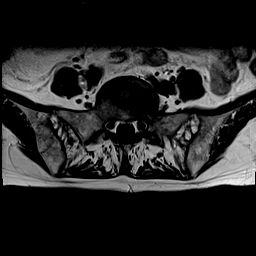
[im 10/35]
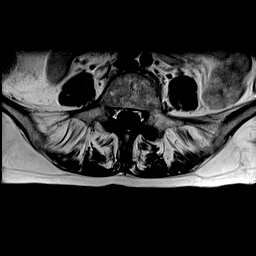
[im 15/35]
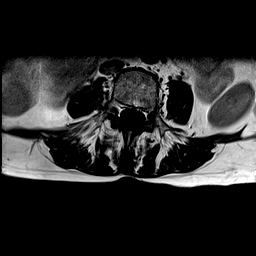
[im 18/35]
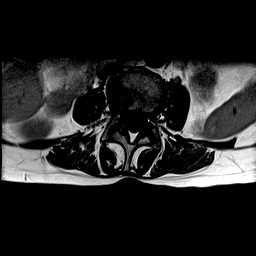
[im 20/35]
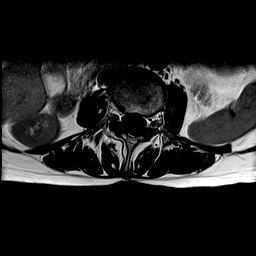
[im 25/35]
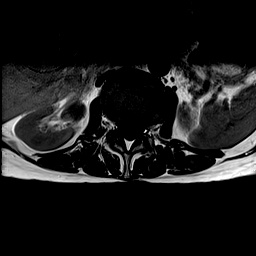
[im 30/35]
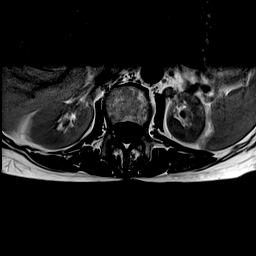
[im 35/35]
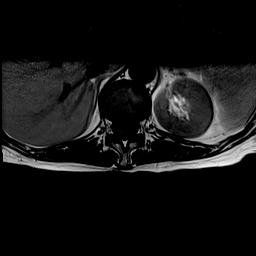

[Series 7: T2 · axial · 4.0mm · 0.78mm/px · z∈[-43,+157]mm · 14 of 35 slices shown]
[im 1/35]
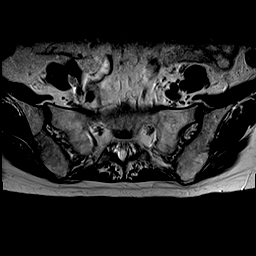
[im 3/35]
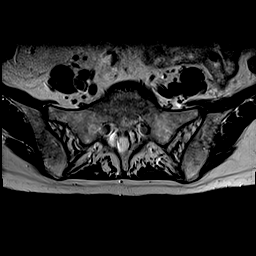
[im 5/35]
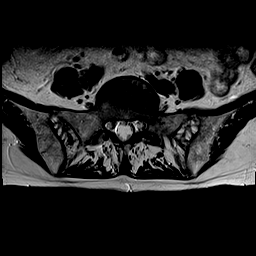
[im 8/35]
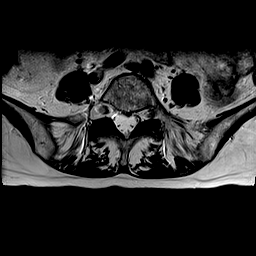
[im 10/35]
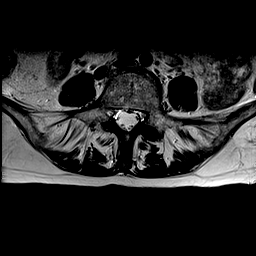
[im 13/35]
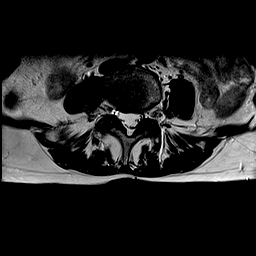
[im 15/35]
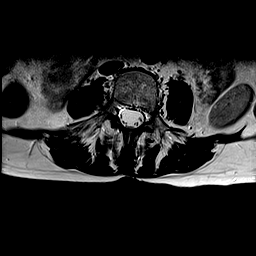
[im 18/35]
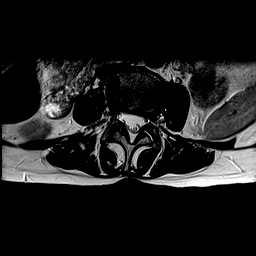
[im 20/35]
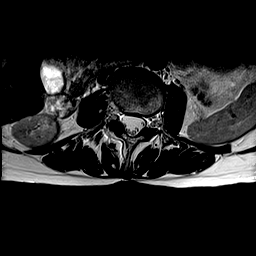
[im 22/35]
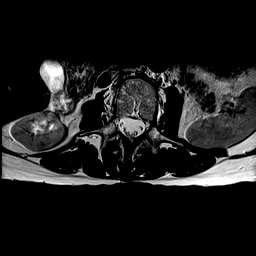
[im 25/35]
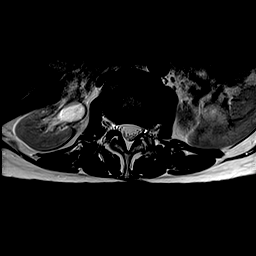
[im 27/35]
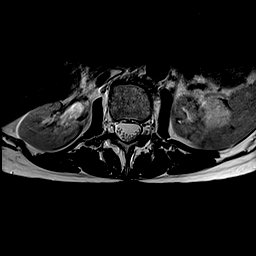
[im 30/35]
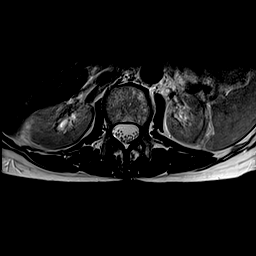
[im 35/35]
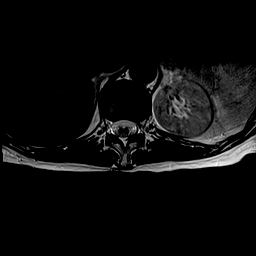

[41 of 48 positions shown; findings below may reference images not displayed]

FINDINGS: Segmentation: Normal on the comparison radiographs which is the same
numbering system on the 3700 MRI.

Alignment: Stable since 3700. Mild levoconvex lumbar scoliosis and
mild straightening of lumbar lordosis. Subtle retrolisthesis at
L2-L3 and L3-L4.

Vertebrae: Endplate degeneration and irregularity in the lower
lumbar spine has progressed since 3700 but there is no marrow edema
or evidence of acute osseous abnormality. Background bone marrow
signal is normal. Intact visible sacrum and SI joints.

Conus medullaris and cauda equina: Conus extends to the L1 level. No
lower spinal cord or conus signal abnormality.

Paraspinal and other soft tissues: Stable and negative.

Disc levels:

T11-T12: Negative.

T12-L1:  Negative.

L1-L2:  Negative.

L2-L3: Chronic disc desiccation and chronic but increased disc space
loss and mild circumferential disc bulge. Small endplate Schmorl's
nodes have increased. No spinal stenosis. Increased borderline to
mild bilateral L2 foraminal stenosis. No convincing lateral recess
stenosis.

L3-L4: Mildly increased chronic disc desiccation and disc space
loss. Mild circumferential disc bulge eccentric to the right
although a small superimposed right paracentral disc protrusion has
regressed. Improved right lateral recess patency. No spinal
stenosis. No foraminal stenosis.

L4-L5: Chronic circumferential disc bulge with new small right
paracentral component of protrusion (series 7, image 23). Chronic
moderate ligament flavum and mild to moderate facet hypertrophy.
Borderline to mild new right lateral recess stenosis (right L5 nerve
level) without spinal or foraminal stenosis.

L5-S1: Chronic mild disc bulge and moderate facet, ligament flavum
hypertrophy. New small left lateral recess component of disc
protrusion best seen on series 3, image 9. New mild to moderate left
lateral recess stenosis (left S1 nerve level) without spinal
stenosis. Mild left L5 foraminal stenosis has increased.
IMPRESSION: 1. Mild progression of disc and endplate degeneration at most lumbar
level since the 3700 MRI. But the L3-L4 level mildly has improved
since that time.
2. New lateral recess involvement by disc degeneration at L4-L5 on
the right, and L5-S1 on the left. Correlate for right L5 and/or left
S1 radiculitis, respectively.
3. No lumbar spinal stenosis.

## 2020-02-09 MED ORDER — ZOLEDRONIC ACID 5 MG/100ML IV SOLN
INTRAVENOUS | Status: AC
  Filled 2020-02-09: qty 100

## 2020-02-09 MED ORDER — ZOLEDRONIC ACID 5 MG/100ML IV SOLN
5.0000 mg | Freq: Once | INTRAVENOUS | Status: AC
  Administered 2020-02-09: 5 mg via INTRAVENOUS

## 2020-02-22 ENCOUNTER — Encounter: Payer: Self-pay | Admitting: Family Medicine

## 2020-02-22 ENCOUNTER — Ambulatory Visit: Payer: PPO | Admitting: Family Medicine

## 2020-02-22 ENCOUNTER — Other Ambulatory Visit: Payer: Self-pay

## 2020-02-22 VITALS — BP 120/70 | HR 91 | Ht 66.5 in | Wt 110.0 lb

## 2020-02-22 DIAGNOSIS — M999 Biomechanical lesion, unspecified: Secondary | ICD-10-CM

## 2020-02-22 DIAGNOSIS — G5701 Lesion of sciatic nerve, right lower limb: Secondary | ICD-10-CM

## 2020-02-22 MED ORDER — NABUMETONE 500 MG PO TABS: 500.0000 mg | ORAL_TABLET | Freq: Two times a day (BID) | ORAL | 1 refills | Status: DC | PRN

## 2020-02-22 MED ORDER — METHOCARBAMOL 500 MG PO TABS: 500.0000 mg | ORAL_TABLET | Freq: Three times a day (TID) | ORAL | 1 refills | Status: DC | PRN

## 2020-02-22 MED FILL — NABUMETONE 500 MG TABLET: 500 | 20 days supply | Qty: 40 | Fill #0

## 2020-02-22 MED FILL — METHOCARBAMOL 500 MG TABS: 500 | 30 days supply | Qty: 90 | Fill #0

## 2020-02-22 NOTE — Patient Instructions (Addendum)
Good to see you Relafen 2 times a day then as needed Robaxin for the next 3 nights  Ice is your friend  See me again in 4 weeks

## 2020-02-22 NOTE — Assessment & Plan Note (Signed)
Patient has had difficulty with this previously.  Chronic problem with an exacerbation.  Prednisone and other medications prescribed including muscle relaxer.  Warned of potential side effects.  Discussed icing regimen.  Responds well to manipulation.  Follow-up again in 4 to 8 weeks

## 2020-02-22 NOTE — Assessment & Plan Note (Signed)

## 2020-02-22 NOTE — Progress Notes (Signed)
[facility] [address] [place] Phone: [phone] Subjective:   I Tracey [name] am serving as a Education administrator for Dr. Hulan Saas.  This visit occurred during the SARS-CoV-2 public health emergency.  Safety protocols were in place, including screening questions prior to the visit, additional usage of staff PPE, and extensive cleaning of exam room while observing appropriate contact time as indicated for disinfecting solutions.   I'm seeing this patient by the request  of:  [name], MD  CC: Leg pain, hip pain, back pain  [id number]   06/15/2019 Patient has signs and symptoms are consistent with more of a tightness in the hamstring.  I am concerned for more of a lumbar radiculopathy that is likely contributing.  Discussed with her about the possibility of formal physical therapy which patient declined.  Patient will consider the possibility of an injection. Do not want to with her only having some mild discomfort and nothing that is stopping her from activity at this point.  Patient will continue with conservative therapy and make no significant changes with the medications.  Follow-up again in 4 to 8 weeks  Update 02/22/2020 Tracey [name] is a 69 y.o. female coming in with complaint of left hamstring pain. Patient is doing well. States this past weekend is he was in a lot of pain.  Patient continues to have some dull, throbbing aching pain.  Patient feels that the leg pain never got better with the back surgery the patient did have last year.  Does state that the back pain did seem to improve.  Patient is concerned because we did have a broken rib previously degrees we have held on any type of osteopathic manipulation for well.  Patient feels though that she should potentially try again for something just seems to be not in alignment.      Past Medical History:  Diagnosis Date  . Closed hip fracture ([place]) 03/2016   RT HIP  .  Hypertension   . Osteopenia   . Osteoporosis    h/o  . Pneumonia   . Skin cancer    multiple skin cancers   Past Surgical History:  Procedure Laterality Date  . CALDWELL LUC    . COLONOSCOPY WITH PROPOFOL N/A 03/22/2014   Procedure: COLONOSCOPY WITH PROPOFOL;  Surgeon: Arta Silence, MD;  Location: [facility];  Service: Endoscopy;  Laterality: N/A;  . HIP PINNING,CANNULATED Right 03/21/2016   Procedure: CANNULATED HIP PINNING;  Surgeon: Rod Can, MD;  Location: [facility];  Service: Orthopedics;  Laterality: Right;  . HIP SURGERY    . LUMBAR LAMINECTOMY/DECOMPRESSION MICRODISCECTOMY Left 02/14/2019   Procedure: Laminectomy and Foraminotomy - L5-S1 - left;  Surgeon: Eustace Moore, MD;  Location: [facility];  Service: Neurosurgery;  Laterality: Left;  Laminectomy and Foraminotomy - L5-S1 - left  . NASAL SEPTUM SURGERY     past hx. deviated septum  . TONSILLECTOMY     Social History   Socioeconomic History  . Marital status: Married    Spouse name: [name]  . Number of children: 1  . Years of education: Not on file  . Highest education level: Not on file  Occupational History  . Occupation: MED TECH    Employer: [employer]  Tobacco Use  . Smoking status: Never Smoker  . Smokeless tobacco: Never Used  Substance and Sexual Activity  . Alcohol use: Not Currently    Alcohol/week: 0.3 standard drinks  . Drug use: No  .  Sexual activity: Yes    Partners: Male    Comment: married, [place]  Other Topics Concern  . Not on file  Social History Narrative  . Not on file   Social Determinants of Health   Financial Resource Strain:   . Difficulty of Paying Living Expenses:   Food Insecurity:   . Worried About Charity fundraiser in the Last Year:   . Arboriculturist in the Last Year:   Transportation Needs:   . Film/video editor (Medical):   Marland Kitchen Lack of Transportation (Non-Medical):   Physical Activity:   . Days of Exercise per Week:   . Minutes of Exercise per  Session:   Stress:   . Feeling of Stress :   Social Connections:   . Frequency of Communication with Friends and Family:   . Frequency of Social Gatherings with Friends and Family:   . Attends Religious Services:   . Active Member of Clubs or Organizations:   . Attends Archivist Meetings:   Marland Kitchen Marital Status:    Allergies  Allergen Reactions  . Adhesive [Tape]   . Cefaclor Hives  . Fosamax [Alendronate Sodium] Other (See Comments)    Gi upset  . Neomycin-Bacitracin Zn-Polymyx Rash   Family History  Problem Relation Age of Onset  . Hypertension Mother   . Leukemia Father   . Hypertension Father   . Diabetes Brother   . Hypertension Brother   . Hyperlipidemia Maternal Grandmother   . Cancer Maternal Grandfather   . Heart disease Paternal Grandfather   . Stroke Paternal Grandfather   . Breast cancer Maternal Aunt   . Breast cancer Paternal Aunt     Current Outpatient Medications (Endocrine & Metabolic):  Marland Kitchen  Zoledronic Acid (RECLAST IV), Inject 1 Dose into the vein once. Once a year  Current Outpatient Medications (Cardiovascular):  .  amLODipine (NORVASC) 5 MG tablet, Take 5 mg by mouth every evening.   Current Outpatient Medications (Analgesics):  .  acetaminophen (TYLENOL) 500 MG tablet, Take 1,000 mg by mouth every 6 (six) hours as needed (for pain.). Marland Kitchen  aspirin EC 81 MG tablet, Take 81 mg by mouth every evening. Marland Kitchen  ibuprofen (ADVIL,MOTRIN) 200 MG tablet, Take 400 mg by mouth every 8 (eight) hours as needed (pain). .  nabumetone (RELAFEN) 500 MG tablet, Take 1 tablet (500 mg total) by mouth 2 (two) times daily as needed.  Current Outpatient Medications (Hematological):  .  folic acid (FOLVITE) 1 MG tablet, Take 1 mg by mouth daily.    Current Outpatient Medications (Other):  .  b complex vitamins tablet, Take 1 tablet by mouth daily. .  Biotin 1 MG CAPS, Take by mouth. .  cholecalciferol (VITAMIN D) 25 MCG (1000 UT) tablet, Take 1,000 Units by mouth  daily. .  cyclobenzaprine (FLEXERIL) 10 MG tablet, Take 0.5 tablets (5 mg total) by mouth 3 (three) times daily as needed. Marland Kitchen  estradiol (ESTRACE VAGINAL) 0.1 MG/GM vaginal cream, Insert  0.5 Applicatorfuls vaginally 2 (two) times a week. Sundays & Wednesdays .  fish oil-omega-3 fatty acids 1000 MG capsule, Take 1,000 mg by mouth 2 (two) times daily.  Marland Kitchen  gabapentin (NEURONTIN) 400 MG capsule, Take 400 mg by mouth at bedtime. .  metroNIDAZOLE (METROGEL) 1 % gel, Apply 1 application topically 2 (two) times daily. Applied to face .  Multiple Minerals-Vitamins (CAL MAG ZINC +D3 PO), Take 2 tablets by mouth 2 (two) times daily. .  Multiple Vitamin (MULTIVITAMIN WITH  MINERALS) TABS tablet, Take 1 tablet by mouth daily. .  psyllium (METAMUCIL) 58.6 % packet, Take 1 packet by mouth daily. .  methocarbamol (ROBAXIN) 500 MG tablet, Take 1 tablet (500 mg total) by mouth every 8 (eight) hours as needed for muscle spasms.   Reviewed prior external information including notes and imaging from  primary care provider As well as notes that were available from care everywhere and other healthcare systems.  Past medical history, social, surgical and family history all reviewed in electronic medical record.  No pertanent information unless stated regarding to the chief complaint.   Review of Systems:  No headache, visual changes, nausea, vomiting, diarrhea, constipation, dizziness, abdominal pain, skin rash, fevers, chills, night sweats, weight loss, swollen lymph nodes, body aches, joint swelling, chest pain, shortness of breath, mood changes. POSITIVE muscle aches  Objective  Blood pressure 120/70, pulse 91, height 5' 6.5" (1.689 m), weight 110 lb (49.9 kg), SpO2 99 %.   General: No apparent distress alert and oriented x3 mood and affect normal, dressed appropriately.  HEENT: Pupils equal, extraocular movements intact  Respiratory: Patient's speak in full sentences and does not appear short of breath    Cardiovascular: No lower extremity edema, non tender, no erythema  Neuro: Cranial nerves II through XII are intact, neurovascularly intact in all extremities with 2+ DTRs and 2+ pulses.  Gait normal with good balance and coordination.  MSK: Patient has lost some weight and has very poor muscle strength. Back exam does have some degenerative scoliosis noted.  Positive lordosis.  Tenderness severe over the right sacroiliac joint as well as the piriformis muscle in the gluteal area. Negative straight leg test.  Mild pain with Corky Sox on the right.  4-5 strength but symmetric to the contralateral side.  Osteopathic findings L2 flexed rotated and side bent right Sacrum right on right   Impression and Recommendations:     This case required medical decision making of moderate complexity. The above documentation has been reviewed and is accurate and complete [name], DO       Note: This dictation was prepared with Dragon dictation along with smaller phrase technology. Any transcriptional errors that result from this process are unintentional.

## 2020-03-13 ENCOUNTER — Other Ambulatory Visit (HOSPITAL_COMMUNITY): Payer: Self-pay | Admitting: Dermatology

## 2020-03-13 MED FILL — metroNIDAZOLE 1 % GEL: 1 | 14 days supply | Qty: 60 | Fill #0

## 2020-03-21 ENCOUNTER — Ambulatory Visit: Payer: PPO | Admitting: Family Medicine

## 2020-03-21 ENCOUNTER — Encounter: Payer: Self-pay | Admitting: Family Medicine

## 2020-03-21 ENCOUNTER — Other Ambulatory Visit: Payer: Self-pay

## 2020-03-21 VITALS — BP 108/78 | HR 71 | Ht 66.5 in | Wt 110.0 lb

## 2020-03-21 DIAGNOSIS — G5701 Lesion of sciatic nerve, right lower limb: Secondary | ICD-10-CM

## 2020-03-21 DIAGNOSIS — M999 Biomechanical lesion, unspecified: Secondary | ICD-10-CM | POA: Diagnosis not present

## 2020-03-21 NOTE — Assessment & Plan Note (Signed)
   Decision today to treat with OMT was based on Physical Exam  After verbal consent patient was treated with, ME, FPR techniques in cervical, thoracic, rib, lumbar and sacral areas, all areas are chronic   Patient tolerated the procedure well with improvement in symptoms  Patient given exercises, stretches and lifestyle modifications  See medications in patient instructions if given  Patient will follow up in 4-8 weeks 

## 2020-03-21 NOTE — Progress Notes (Signed)
[facility] [place] Phone: [phone] Subjective:   Tracey [name], am serving as a scribe for Dr. Hulan Saas. This visit occurred during the SARS-CoV-2 public health emergency.  Safety protocols were in place, including screening questions prior to the visit, additional usage of staff PPE, and extensive cleaning of exam room while observing appropriate contact time as indicated for disinfecting solutions.   I'm seeing this patient by the request  of:  Tracey Carol, MD  CC: Low back pain follow-up  [id number]  Tracey [name] is a 69 y.o. female coming in with complaint of back pan. Last seen on 02/22/2020 for OMT. Patient states that she has good and bad days. States that she was painting a chair and reaching while sitting down. Feels she pulled something doing this motion. Right side SI joint is bothering her today.  Patient has had a closed hip fracture previously on the side.  Patient is still having some dull, throbbing aching pain but nothing severe.  Patient states that sometimes it seems to be associated with certain activities but she has not figured out why it seems to go away on its own.     Past Medical History:  Diagnosis Date  . Closed hip fracture ([place]) 03/2016   RT HIP  . Hypertension   . Osteopenia   . Osteoporosis    h/o  . Pneumonia   . Skin cancer    multiple skin cancers   Past Surgical History:  Procedure Laterality Date  . CALDWELL LUC    . COLONOSCOPY WITH PROPOFOL N/A 03/22/2014   Procedure: COLONOSCOPY WITH PROPOFOL;  Surgeon: Arta Silence, MD;  [facility];  Service: Endoscopy;  Laterality: N/A;  . HIP PINNING,CANNULATED Right 03/21/2016   Procedure: CANNULATED HIP PINNING;  Surgeon: Rod Can, MD;  Location: [facility];  Service: Orthopedics;  Laterality: Right;  . HIP SURGERY    . LUMBAR LAMINECTOMY/DECOMPRESSION MICRODISCECTOMY Left 02/14/2019   Procedure: Laminectomy  and Foraminotomy - L5-S1 - left;  Surgeon: Eustace Moore, MD;  Location[facility];  Service: Neurosurgery;  Laterality: Left;  Laminectomy and Foraminotomy - L5-S1 - left  . NASAL SEPTUM SURGERY     past hx. deviated septum  . TONSILLECTOMY     Social History   Socioeconomic History  . Marital status: Married    Spouse name: [name]  . Number of children: 1  . Years of education: Not on file  . Highest education level: Not on file  Occupational History  . Occupation: MED TECH    Employer: [employer]  Tobacco Use  . Smoking status: Never Smoker  . Smokeless tobacco: Never Used  Substance and Sexual Activity  . Alcohol use: Not Currently    Alcohol/week: 0.3 standard drinks  . Drug use: [name]  . Sexual activity: Yes    Partners: Male    Comment: married, [place]  Other Topics Concern  . Not on file  Social History Narrative  . Not on file   Social Determinants of Health   Financial Resource Strain:   . Difficulty of Paying Living Expenses:   Food Insecurity:   . Worried About Charity fundraiser in the Last Year:   . Arboriculturist in the Last Year:   Transportation Needs:   . Film/video editor (Medical):   Marland Kitchen Lack of Transportation (Non-Medical):   Physical Activity:   . Days of Exercise per Week:   .  Minutes of Exercise per Session:   Stress:   . Feeling of Stress :   Social Connections:   . Frequency of Communication with Friends and Family:   . Frequency of Social Gatherings with Friends and Family:   . Attends Religious Services:   . Active Member of Clubs or Organizations:   . Attends Archivist Meetings:   Marland Kitchen Marital Status:    Allergies  Allergen Reactions  . Adhesive [Tape]   . Cefaclor Hives  . Fosamax [Alendronate Sodium] Other (See Comments)    Gi upset  . Neomycin-Bacitracin Zn-Polymyx Rash   Family History  Problem Relation Age of Onset  . Hypertension Mother   . Leukemia Father   . Hypertension Father   . Diabetes Brother    . Hypertension Brother   . Hyperlipidemia Maternal Grandmother   . Cancer Maternal Grandfather   . Heart disease Paternal Grandfather   . Stroke Paternal Grandfather   . Breast cancer Maternal Aunt   . Breast cancer Paternal Aunt     Current Outpatient Medications (Endocrine & Metabolic):  Marland Kitchen  Zoledronic Acid (RECLAST IV), Inject 1 Dose into the vein once. Once a year  Current Outpatient Medications (Cardiovascular):  .  amLODipine (NORVASC) 5 MG tablet, Take 5 mg by mouth every evening.   Current Outpatient Medications (Analgesics):  .  acetaminophen (TYLENOL) 500 MG tablet, Take 1,000 mg by mouth every 6 (six) hours as needed (for pain.). Marland Kitchen  aspirin EC 81 MG tablet, Take 81 mg by mouth every evening. Marland Kitchen  ibuprofen (ADVIL,MOTRIN) 200 MG tablet, Take 400 mg by mouth every 8 (eight) hours as needed (pain). .  nabumetone (RELAFEN) 500 MG tablet, Take 1 tablet (500 mg total) by mouth 2 (two) times daily as needed.  Current Outpatient Medications (Hematological):  .  folic acid (FOLVITE) 1 MG tablet, Take 1 mg by mouth daily.    Current Outpatient Medications (Other):  .  b complex vitamins tablet, Take 1 tablet by mouth daily. .  Biotin 1 MG CAPS, Take by mouth. .  cholecalciferol (VITAMIN D) 25 MCG (1000 UT) tablet, Take 1,000 Units by mouth daily. .  cyclobenzaprine (FLEXERIL) 10 MG tablet, Take 0.5 tablets (5 mg total) by mouth 3 (three) times daily as needed. Marland Kitchen  estradiol (ESTRACE VAGINAL) 0.1 MG/GM vaginal cream, Insert  0.5 Applicatorfuls vaginally 2 (two) times a week. Sundays & Wednesdays .  fish oil-omega-3 fatty acids 1000 MG capsule, Take 1,000 mg by mouth 2 (two) times daily.  Marland Kitchen  gabapentin (NEURONTIN) 400 MG capsule, Take 400 mg by mouth at bedtime. .  methocarbamol (ROBAXIN) 500 MG tablet, Take 1 tablet (500 mg total) by mouth every 8 (eight) hours as needed for muscle spasms. .  metroNIDAZOLE (METROGEL) 1 % gel, Apply 1 application topically 2 (two) times daily. Applied  to face .  Multiple Minerals-Vitamins (CAL MAG ZINC +D3 PO), Take 2 tablets by mouth 2 (two) times daily. .  Multiple Vitamin (MULTIVITAMIN WITH MINERALS) TABS tablet, Take 1 tablet by mouth daily. .  psyllium (METAMUCIL) 58.6 % packet, Take 1 packet by mouth daily.   Reviewed prior external information including notes and imaging from  primary care provider As well as notes that were available from care everywhere and other healthcare systems.  Past medical history, social, surgical and family history all reviewed in electronic medical record.  [name] pertanent information unless stated regarding to the chief complaint.   Review of Systems:  [name] headache, visual changes, nausea,  vomiting, diarrhea, constipation, dizziness, abdominal pain, skin rash, fevers, chills, night sweats, weight loss, swollen lymph nodes, body aches, joint swelling, chest pain, shortness of breath, mood changes. POSITIVE muscle aches  Objective  Blood pressure 108/78, pulse 71, height 5' 6.5" (1.689 m), weight 110 lb (49.9 kg), SpO2 99 %.   General: [name] apparent distress alert and oriented x3 mood and affect normal, dressed appropriately.  HEENT: Pupils equal, extraocular movements intact  Respiratory: Patient's speak in full sentences and does not appear short of breath  Cardiovascular: [name] lower extremity edema, non tender, [name] erythema  Neuro: Cranial nerves II through XII are intact, neurovascularly intact in all extremities with 2+ DTRs and 2+ pulses.  Gait normal with good balance and coordination.  MSK:  Non tender with full range of motion and good stability and symmetric strength and tone of shoulders, elbows, wrist, hip, knee and ankles bilaterally.  Low back exam shows some tightness over the right sacroiliac joint more than the left.  Right gluteal tendon has significant tenderness as well.  Negative straight leg test.  Neurovascularly intact distally.  Mild worsening pain with extension.  Osteopathic  findings C2 flexed rotated and side bent right T7 extended rotated and side bent right inhaled rib L2 flexed rotated and side bent right Sacrum right on right    Impression and Recommendations:     This case required medical decision making of moderate complexity. The above documentation has been reviewed and is accurate and complete Tracey Pulley, DO       Note: This dictation was prepared with Dragon dictation along with smaller phrase technology. Any transcriptional errors that result from this process are unintentional.

## 2020-03-21 NOTE — Assessment & Plan Note (Signed)
Patient has had more of a chronic exacerbation probably of her piriformis but there is a potential for the lumbar radiculopathy.  Discussed with patient about icing regimen, home exercise, which activities to do which wants to avoid.  Increase activity slowly.  Responds well to manipulation.  Discussed medication management including the Robaxin and refill given today.

## 2020-03-21 NOTE — Patient Instructions (Signed)
Continue robaxin at night Use NSAIDs for 3-5 days as needed See me in 6-8 weeks

## 2020-04-03 DIAGNOSIS — Z85820 Personal history of malignant melanoma of skin: Secondary | ICD-10-CM | POA: Diagnosis not present

## 2020-04-03 DIAGNOSIS — D225 Melanocytic nevi of trunk: Secondary | ICD-10-CM | POA: Diagnosis not present

## 2020-04-03 DIAGNOSIS — D2272 Melanocytic nevi of left lower limb, including hip: Secondary | ICD-10-CM | POA: Diagnosis not present

## 2020-04-03 DIAGNOSIS — L821 Other seborrheic keratosis: Secondary | ICD-10-CM | POA: Diagnosis not present

## 2020-04-03 DIAGNOSIS — C44619 Basal cell carcinoma of skin of left upper limb, including shoulder: Secondary | ICD-10-CM | POA: Diagnosis not present

## 2020-04-03 DIAGNOSIS — D1801 Hemangioma of skin and subcutaneous tissue: Secondary | ICD-10-CM | POA: Diagnosis not present

## 2020-04-03 DIAGNOSIS — D0461 Carcinoma in situ of skin of right upper limb, including shoulder: Secondary | ICD-10-CM | POA: Diagnosis not present

## 2020-04-03 DIAGNOSIS — Z85828 Personal history of other malignant neoplasm of skin: Secondary | ICD-10-CM | POA: Diagnosis not present

## 2020-04-03 DIAGNOSIS — D2271 Melanocytic nevi of right lower limb, including hip: Secondary | ICD-10-CM | POA: Diagnosis not present

## 2020-04-04 MED FILL — AMLODIPINE BESYLATE 5 MG TA: 5 | 90 days supply | Qty: 90 | Fill #2

## 2020-04-04 MED FILL — FOLIC ACID 1 MG TABS: 1 | 90 days supply | Qty: 90 | Fill #2

## 2020-04-19 ENCOUNTER — Observation Stay (HOSPITAL_COMMUNITY)
Admission: EM | Admit: 2020-04-19 | Discharge: 2020-04-21 | Disposition: A | Discharge status: Home / Self Care | Payer: PPO | Attending: Internal Medicine | Admitting: Internal Medicine

## 2020-04-19 ENCOUNTER — Emergency Department (HOSPITAL_COMMUNITY): Payer: PPO

## 2020-04-19 ENCOUNTER — Encounter (HOSPITAL_COMMUNITY): Payer: Self-pay | Admitting: *Deleted

## 2020-04-19 ENCOUNTER — Other Ambulatory Visit: Payer: Self-pay

## 2020-04-19 DIAGNOSIS — I119 Hypertensive heart disease without heart failure: Secondary | ICD-10-CM | POA: Insufficient documentation

## 2020-04-19 DIAGNOSIS — Z7982 Long term (current) use of aspirin: Secondary | ICD-10-CM | POA: Diagnosis not present

## 2020-04-19 DIAGNOSIS — E785 Hyperlipidemia, unspecified: Secondary | ICD-10-CM | POA: Insufficient documentation

## 2020-04-19 DIAGNOSIS — R079 Chest pain, unspecified: Secondary | ICD-10-CM | POA: Diagnosis not present

## 2020-04-19 DIAGNOSIS — Z20822 Contact with and (suspected) exposure to covid-19: Secondary | ICD-10-CM | POA: Diagnosis not present

## 2020-04-19 DIAGNOSIS — Z79899 Other long term (current) drug therapy: Secondary | ICD-10-CM | POA: Diagnosis not present

## 2020-04-19 DIAGNOSIS — Z79890 Hormone replacement therapy (postmenopausal): Secondary | ICD-10-CM | POA: Diagnosis not present

## 2020-04-19 DIAGNOSIS — Z888 Allergy status to other drugs, medicaments and biological substances status: Secondary | ICD-10-CM | POA: Diagnosis not present

## 2020-04-19 DIAGNOSIS — I10 Essential (primary) hypertension: Secondary | ICD-10-CM | POA: Diagnosis present

## 2020-04-19 DIAGNOSIS — R110 Nausea: Secondary | ICD-10-CM | POA: Diagnosis not present

## 2020-04-19 DIAGNOSIS — Z881 Allergy status to other antibiotic agents status: Secondary | ICD-10-CM | POA: Diagnosis not present

## 2020-04-19 DIAGNOSIS — R0789 Other chest pain: Secondary | ICD-10-CM | POA: Diagnosis not present

## 2020-04-19 LAB — CBC
HCT: 43.6 % (ref 36.0–46.0)
Hemoglobin: 14.4 g/dL (ref 12.0–15.0)
MCH: 29 pg (ref 26.0–34.0)
MCHC: 33 g/dL (ref 30.0–36.0)
MCV: 87.7 fL (ref 80.0–100.0)
Platelets: 210 10*3/uL (ref 150–400)
RBC: 4.97 MIL/uL (ref 3.87–5.11)
RDW: 13.2 % (ref 11.5–15.5)
WBC: 5.8 10*3/uL (ref 4.0–10.5)
nRBC: 0 % (ref 0.0–0.2)

## 2020-04-19 LAB — BASIC METABOLIC PANEL
Anion gap: 10 (ref 5–15)
BUN: 18 mg/dL (ref 8–23)
CO2: 30 mmol/L (ref 22–32)
Calcium: 10.2 mg/dL (ref 8.9–10.3)
Chloride: 101 mmol/L (ref 98–111)
Creatinine, Ser: 1.15 mg/dL — ABNORMAL HIGH (ref 0.44–1.00)
GFR calc Af Amer: 56 mL/min — ABNORMAL LOW (ref >60)
GFR calc non Af Amer: 48 mL/min — ABNORMAL LOW (ref >60)
Glucose, Bld: 121 mg/dL — ABNORMAL HIGH (ref 70–99)
Potassium: 3.8 mmol/L (ref 3.5–5.1)
Sodium: 141 mmol/L (ref 135–145)

## 2020-04-19 LAB — TROPONIN I (HIGH SENSITIVITY): Troponin I (High Sensitivity): 3 ng/L (ref <18)

## 2020-04-19 MED ORDER — SODIUM CHLORIDE 0.9% FLUSH: 3.0000 mL | Freq: Once | INTRAVENOUS | Status: DC

## 2020-04-19 NOTE — ED Triage Notes (Signed)
Pt has had nausea and left sided jaw pain for about a week. Pain in the left chest, radiates to the left back that started today.

## 2020-04-20 ENCOUNTER — Observation Stay (HOSPITAL_BASED_OUTPATIENT_CLINIC_OR_DEPARTMENT_OTHER): Payer: PPO

## 2020-04-20 ENCOUNTER — Encounter (HOSPITAL_COMMUNITY): Payer: Self-pay | Admitting: Internal Medicine

## 2020-04-20 ENCOUNTER — Observation Stay (HOSPITAL_COMMUNITY): Payer: PPO

## 2020-04-20 DIAGNOSIS — R079 Chest pain, unspecified: Secondary | ICD-10-CM | POA: Diagnosis present

## 2020-04-20 DIAGNOSIS — I10 Essential (primary) hypertension: Secondary | ICD-10-CM

## 2020-04-20 DIAGNOSIS — Z20822 Contact with and (suspected) exposure to covid-19: Secondary | ICD-10-CM | POA: Diagnosis not present

## 2020-04-20 DIAGNOSIS — R0789 Other chest pain: Secondary | ICD-10-CM | POA: Diagnosis not present

## 2020-04-20 LAB — HEPATIC FUNCTION PANEL
ALT: 21 U/L (ref 0–44)
AST: 25 U/L (ref 15–41)
Albumin: 3.9 g/dL (ref 3.5–5.0)
Alkaline Phosphatase: 46 U/L (ref 38–126)
Bilirubin, Direct: 0.2 mg/dL (ref 0.0–0.2)
Indirect Bilirubin: 0.8 mg/dL (ref 0.3–0.9)
Total Bilirubin: 1 mg/dL (ref 0.3–1.2)
Total Protein: 6.9 g/dL (ref 6.5–8.1)

## 2020-04-20 LAB — CREATININE, SERUM
Creatinine, Ser: 0.94 mg/dL (ref 0.44–1.00)
GFR calc Af Amer: >60 mL/min (ref >60)
GFR calc non Af Amer: >60 mL/min (ref >60)

## 2020-04-20 LAB — LIPID PANEL
Cholesterol: 228 mg/dL — ABNORMAL HIGH (ref 0–200)
HDL: 97 mg/dL (ref >40)
LDL Cholesterol: 120 mg/dL — ABNORMAL HIGH (ref 0–99)
Total CHOL/HDL Ratio: 2.4 RATIO
Triglycerides: 55 mg/dL (ref <150)
VLDL: 11 mg/dL (ref 0–40)

## 2020-04-20 LAB — CBC
HCT: 41.5 % (ref 36.0–46.0)
Hemoglobin: 13.8 g/dL (ref 12.0–15.0)
MCH: 28.9 pg (ref 26.0–34.0)
MCHC: 33.3 g/dL (ref 30.0–36.0)
MCV: 86.8 fL (ref 80.0–100.0)
Platelets: 201 10*3/uL (ref 150–400)
RBC: 4.78 MIL/uL (ref 3.87–5.11)
RDW: 13.2 % (ref 11.5–15.5)
WBC: 4.6 10*3/uL (ref 4.0–10.5)
nRBC: 0 % (ref 0.0–0.2)

## 2020-04-20 LAB — D-DIMER, QUANTITATIVE: D-Dimer, Quant: 0.27 ug/mL-FEU (ref 0.00–0.50)

## 2020-04-20 LAB — HIV ANTIBODY (ROUTINE TESTING W REFLEX): HIV Screen 4th Generation wRfx: NONREACTIVE

## 2020-04-20 LAB — ECHOCARDIOGRAM COMPLETE
Height: 66 in
Weight: 1632 oz

## 2020-04-20 LAB — TROPONIN I (HIGH SENSITIVITY): Troponin I (High Sensitivity): 6 ng/L (ref <18)

## 2020-04-20 LAB — SARS CORONAVIRUS 2 BY RT PCR (HOSPITAL ORDER, PERFORMED IN ~~LOC~~ HOSPITAL LAB): SARS Coronavirus 2: NEGATIVE

## 2020-04-20 MED ORDER — NITROGLYCERIN 0.4 MG SL SUBL
0.4000 mg | SUBLINGUAL_TABLET | SUBLINGUAL | Status: DC | PRN
  Administered 2020-04-20 (×2): 0.4 mg via SUBLINGUAL
  Filled 2020-04-20: qty 1

## 2020-04-20 MED ORDER — AMLODIPINE BESYLATE 5 MG PO TABS
5.0000 mg | ORAL_TABLET | Freq: Every evening | ORAL | Status: DC
  Administered 2020-04-20: 5 mg via ORAL
  Filled 2020-04-20: qty 1

## 2020-04-20 MED ORDER — IOHEXOL 350 MG/ML SOLN
100.0000 mL | Freq: Once | INTRAVENOUS | Status: AC | PRN
  Administered 2020-04-20: 100 mL via INTRAVENOUS

## 2020-04-20 MED ORDER — NITROGLYCERIN 0.4 MG SL SUBL
SUBLINGUAL_TABLET | SUBLINGUAL | Status: AC
  Administered 2020-04-20: 0.8 mg
  Filled 2020-04-20: qty 2

## 2020-04-20 MED ORDER — METOPROLOL TARTRATE 25 MG PO TABS
100.0000 mg | ORAL_TABLET | Freq: Once | ORAL | Status: AC
  Administered 2020-04-20: 100 mg via ORAL
  Filled 2020-04-20: qty 4

## 2020-04-20 MED ORDER — ONDANSETRON HCL 4 MG/2ML IJ SOLN: 4.0000 mg | Freq: Four times a day (QID) | INTRAMUSCULAR | Status: DC | PRN

## 2020-04-20 MED ORDER — ASPIRIN EC 81 MG PO TBEC: 81.0000 mg | DELAYED_RELEASE_TABLET | Freq: Every evening | ORAL | Status: DC

## 2020-04-20 MED ORDER — ENOXAPARIN SODIUM 40 MG/0.4ML ~~LOC~~ SOLN
40.0000 mg | SUBCUTANEOUS | Status: DC
  Administered 2020-04-20 – 2020-04-21 (×2): 40 mg via SUBCUTANEOUS
  Filled 2020-04-20 (×2): qty 0.4

## 2020-04-20 MED ORDER — ACETAMINOPHEN 325 MG PO TABS: 650.0000 mg | ORAL_TABLET | ORAL | Status: DC | PRN

## 2020-04-20 MED ORDER — NITROGLYCERIN 0.4 MG SL SUBL: 0.8000 mg | SUBLINGUAL_TABLET | Freq: Once | SUBLINGUAL | Status: AC

## 2020-04-20 MED ORDER — GABAPENTIN 400 MG PO CAPS
400.0000 mg | ORAL_CAPSULE | Freq: Every day | ORAL | Status: DC
  Administered 2020-04-20: 400 mg via ORAL
  Filled 2020-04-20: qty 1

## 2020-04-20 MED ORDER — ASPIRIN 81 MG PO CHEW
324.0000 mg | CHEWABLE_TABLET | Freq: Once | ORAL | Status: DC
  Filled 2020-04-20: qty 4

## 2020-04-20 NOTE — Care Management Obs Status (Signed)
[facility] NOTIFICATION   Patient Details  Name: Temara A [place]ique MRN: [id number] Date of Birth: 16-Dec-1950   Medicare Observation Status Notification Given:  Yes    Verdell Carmine, RN 04/20/2020, 3:48 PM

## 2020-04-20 NOTE — Progress Notes (Signed)
Pt arrived to rm 4E05 from ED. Initiated tele. CHG and assessment performed. VSS. Call bell within reach.   Lavenia Atlas, RN

## 2020-04-20 NOTE — H&P (Signed)
History and Physical    Tracey [name] [id number] DOB: February 08, 1951 DOA: 04/19/2020  PCP: [name], MD  Patient coming from: Home.  Chief Complaint: Chest pain.  HPI: Tracey [name] is a 69 y.o. female with history of hypertension presents to the ER because of chest pain.  Patient states over the last 1 week patient has been experiencing left jaw and neck pain which is present even at rest.  Yesterday patient after going to the bathroom and sitting on the bed patient became diaphoretic started having retrosternal chest pressure.  Also some pain around the right side of the chest.  This lasted for around 25 to 30 minutes and resolved.  But has been recurring the whole day off and on.  Given the symptoms patient presents to the ER.  Has some nausea denies any abdominal pain.  ED Course: In the ER chest x-ray was unremarkable EKG shows nonspecific T wave changes cardiac markers were negative.  Patient admitted for further management of chest pain.  Covid test is negative.  CBC unremarkable creatinine 1.1.  Review of Systems: As per HPI, rest all negative.   Past Medical History:  Diagnosis Date  . Closed hip fracture ([place]) 03/2016   RT HIP  . Hypertension   . Osteopenia   . Osteoporosis    h/o  . Pneumonia   . Skin cancer    multiple skin cancers    Past Surgical History:  Procedure Laterality Date  . CALDWELL LUC    . COLONOSCOPY WITH PROPOFOL N/A 03/22/2014   Procedure: COLONOSCOPY WITH PROPOFOL;  Surgeon: Arta Silence, MD;  Location: [facility];  Service: Endoscopy;  Laterality: N/A;  . HIP PINNING,CANNULATED Right 03/21/2016   Procedure: CANNULATED HIP PINNING;  Surgeon: Rod Can, MD;  Location: [place];  Service: Orthopedics;  Laterality: Right;  . HIP SURGERY    . LUMBAR LAMINECTOMY/DECOMPRESSION MICRODISCECTOMY Left 02/14/2019   Procedure: Laminectomy and Foraminotomy - L5-S1 - left;  Surgeon: Eustace Moore, MD;  Location: [place];  Service: Neurosurgery;   Laterality: Left;  Laminectomy and Foraminotomy - L5-S1 - left  . NASAL SEPTUM SURGERY     past hx. deviated septum  . TONSILLECTOMY       reports that she has never smoked. She has never used smokeless tobacco. She reports previous alcohol use of about 0.3 standard drinks of alcohol per week. She reports that she does not use drugs.  Allergies  Allergen Reactions  . Adhesive [Tape]   . Cefaclor Hives  . Fosamax [Alendronate Sodium] Other (See Comments)    Gi upset  . Neomycin-Bacitracin Zn-Polymyx Rash    Family History  Problem Relation Age of Onset  . Hypertension Mother   . Leukemia Father   . Hypertension Father   . Diabetes Brother   . Hypertension Brother   . Hyperlipidemia Maternal Grandmother   . Cancer Maternal Grandfather   . Heart disease Paternal Grandfather   . Stroke Paternal Grandfather   . Breast cancer Maternal Aunt   . Breast cancer Paternal Aunt     Prior to Admission medications   Medication Sig Start Date End Date Taking? Authorizing Provider  acetaminophen (TYLENOL) 500 MG tablet Take 1,000 mg by mouth every 6 (six) hours as needed (for pain.).   Yes [provider]  amLODipine (NORVASC) 5 MG tablet Take 5 mg by mouth every evening.   Yes [provider]  aspirin EC 81 MG tablet Take 81 mg by mouth every evening.  Yes [provider]  b complex vitamins tablet Take 1 tablet by mouth daily.   Yes [provider]  Biotin 1 MG CAPS Take 1 capsule by mouth daily.    Yes [provider]  cholecalciferol (VITAMIN D) 25 MCG (1000 UT) tablet Take 1,000 Units by mouth daily.   Yes [provider]  estradiol (ESTRACE VAGINAL) 0.1 MG/GM vaginal cream Insert  0.5 Applicatorfuls vaginally 2 (two) times a week. Sundays & Wednesdays 08/05/19  Yes Princess Bruins, MD  fish oil-omega-3 fatty acids 1000 MG capsule Take 1,000 mg by mouth 2 (two) times daily.    Yes [provider]  folic acid (FOLVITE) 1 MG  tablet Take 1 mg by mouth daily.     Yes [provider]  gabapentin (NEURONTIN) 400 MG capsule Take 400 mg by mouth at bedtime.   Yes [provider]  ibuprofen (ADVIL,MOTRIN) 200 MG tablet Take 400 mg by mouth every 8 (eight) hours as needed (pain).   Yes [provider]  methocarbamol (ROBAXIN) 500 MG tablet Take 1 tablet (500 mg total) by mouth every 8 (eight) hours as needed for muscle spasms. 02/22/20  Yes [name], DO  metroNIDAZOLE (METROGEL) 1 % gel Apply 1 application topically 2 (two) times daily. Applied to face 11/30/18  Yes [provider]  Multiple Minerals-Vitamins (CAL MAG ZINC +D3 PO) Take 2 tablets by mouth 2 (two) times daily.   Yes [provider]  nabumetone (RELAFEN) 500 MG tablet Take 1 tablet (500 mg total) by mouth 2 (two) times daily as needed. Patient taking differently: Take 500 mg by mouth 2 (two) times daily as needed for mild pain.  02/22/20  Yes [name], DO  Polyethyl Glycol-Propyl Glycol (SYSTANE OP) Place 1 drop into both eyes 2 (two) times daily as needed (Dry/Irritated eyes).   Yes [provider]  psyllium (METAMUCIL) 58.6 % packet Take 1 packet by mouth daily.   Yes [provider]  cyclobenzaprine (FLEXERIL) 10 MG tablet Take 0.5 tablets (5 mg total) by mouth 3 (three) times daily as needed. Patient not taking: Reported on 04/20/2020 02/15/19   Eustace Moore, MD  Multiple Vitamin (MULTIVITAMIN WITH MINERALS) TABS tablet Take 1 tablet by mouth daily.    [provider]  Zoledronic Acid (RECLAST IV) Inject 1 Dose into the vein once. Once a year    [provider]    Physical Exam: Constitutional: Moderately built and nourished. Vitals:   04/20/20 0130 04/20/20 0145 04/20/20 0200 04/20/20 0205  BP: (!) 141/79 (!) 149/87 (!) 153/94 (!) 129/91  Pulse: 68 67 67 76  Resp: 13 15 19 13   Temp:      TempSrc:      SpO2: 100% 99% 100% 98%  Weight:      Height:        Eyes: Anicteric no pallor. ENMT: No discharge from the ears eyes nose or mouth. Neck: No mass felt.  No neck rigidity. Respiratory: No rhonchi or crepitations. Cardiovascular: S1-S2 heard. Abdomen: Soft nontender bowel sound present. Musculoskeletal: No edema. Skin: No rash. Neurologic: Alert awake oriented to time place and person.  Moves all extremities. Psychiatric: Appears normal per normal affect.   Labs on Admission: I have personally reviewed following labs and imaging studies  CBC: Recent Labs  Lab 04/19/20 2126  WBC 5.8  HGB 14.4  HCT 43.6  MCV 87.7  PLT [id number]   Basic Metabolic Panel: Recent Labs  Lab 04/19/20 2126  NA 141  K 3.8  CL 101  CO2 30  GLUCOSE 121*  BUN 18  CREATININE 1.15*  CALCIUM 10.2   GFR: Estimated Creatinine Clearance: 33.3 mL/min (A) (by C-G formula based on SCr of 1.15 mg/dL (H)). Liver Function Tests: No results for input(s): AST, ALT, ALKPHOS, BILITOT, PROT, ALBUMIN in the last 168 hours. No results for input(s): LIPASE, AMYLASE in the last 168 hours. No results for input(s): AMMONIA in the last 168 hours. Coagulation Profile: No results for input(s): INR, PROTIME in the last 168 hours. Cardiac Enzymes: No results for input(s): CKTOTAL, CKMB, CKMBINDEX, TROPONINI in the last 168 hours. BNP (last 3 results) No results for input(s): PROBNP in the last 8760 hours. HbA1C: No results for input(s): HGBA1C in the last 72 hours. CBG: No results for input(s): GLUCAP in the last 168 hours. Lipid Profile: No results for input(s): CHOL, HDL, LDLCALC, TRIG, CHOLHDL, LDLDIRECT in the last 72 hours. Thyroid Function Tests: No results for input(s): TSH, T4TOTAL, FREET4, T3FREE, THYROIDAB in the last 72 hours. Anemia Panel: No results for input(s): VITAMINB12, FOLATE, FERRITIN, TIBC, IRON, RETICCTPCT in the last 72 hours. Urine analysis:    Component Value Date/Time   COLORURINE YELLOW 03/20/2016 2203   APPEARANCEUR CLEAR 03/20/2016 2203    LABSPEC 1.008 03/20/2016 2203   PHURINE 8.0 03/20/2016 2203   GLUCOSEU NEGATIVE 03/20/2016 2203   HGBUR NEGATIVE 03/20/2016 2203   BILIRUBINUR NEGATIVE 03/20/2016 2203   KETONESUR 15 (A) 03/20/2016 2203   PROTEINUR NEGATIVE 03/20/2016 2203   NITRITE NEGATIVE 03/20/2016 2203   LEUKOCYTESUR SMALL (A) 03/20/2016 2203   Sepsis Labs: @LABRCNTIP (procalcitonin:4,lacticidven:4) )No results found for this or any previous visit (from the past 240 hour(s)).   Radiological Exams on Admission: DG Chest 2 View  Result Date: 04/19/2020 CLINICAL DATA:  Left-sided chest pain radiating to the jaw with nausea x1 day. EXAM: CHEST - 2 VIEW COMPARISON:  February 10, 2019 FINDINGS: The lungs are hyperinflated. There is no evidence of acute infiltrate, pleural effusion or pneumothorax. The heart size and mediastinal contours are within normal limits. The visualized skeletal structures are unremarkable. IMPRESSION: No active cardiopulmonary disease. Electronically Signed   By: Virgina Norfolk M.D.   On: 04/19/2020 21:38    EKG: Independently reviewed.  Normal sinus rhythm.  Nonspecific T wave changes.  Assessment/Plan Principal Problem:   Chest pain Active Problems:   Essential hypertension    1. Chest pain -we will admit for further observation and rule out ACS consult cardiology.  We will also check D-dimer. 2. Hypertension on amlodipine. 3. Nausea -abdominal exam appears benign.  LFTs pending.   DVT prophylaxis: Lovenox. Code Status: Full code. Family Communication: Discussed with patient. Disposition Plan: Home. Consults called: Cardiology. Admission status: Observation.   Rise Patience MD [facility] Pager [phone].  If 7PM-7AM, please contact night-coverage [email or web address] Password [facility]  04/20/2020, 2:45 AM

## 2020-04-20 NOTE — Progress Notes (Signed)
69 year old female with history of hypertension admitted this morning with chest pain diaphoresis.  Seen by cardiology ordered CTA.  Patient currently pain-free.

## 2020-04-20 NOTE — ED Provider Notes (Signed)
TIME SEEN: 1:47 AM  CHIEF COMPLAINT: Chest pain, jaw pain, diaphoresis, nausea  HPI: Patient is a 69 year old female with history of hypertension, hyperlipidemia that is not being treated who presents to the emergency department with complaints of intermittent left-sided jaw discomfort for the past week.  He started having left-sided chest pain as well today that she cannot describe any further.  No shortness of breath but has had associated clamminess, diaphoresis and nausea.  No vomiting.  No fever or cough.  No lower extremity swelling or pain.  States her last stress test and echocardiogram were years ago.  No known history of CAD.  Has never had a catheterization.  Denies history of PE or DVT.  States that symptoms concerned her today.  She does note that she was able to walk stairs however without any exacerbation of her symptoms.  Not having any chest pain currently.  ROS: See HPI Constitutional: no fever  Eyes: no drainage  ENT: no runny nose   Cardiovascular:   chest pain  Resp: no SOB  GI: no vomiting GU: no dysuria Integumentary: no rash  Allergy: no hives  Musculoskeletal: no leg swelling  Neurological: no slurred speech ROS otherwise negative  PAST MEDICAL HISTORY/PAST SURGICAL HISTORY:  Past Medical History:  Diagnosis Date  . Closed hip fracture ([place]) 03/2016   RT HIP  . Hypertension   . Osteopenia   . Osteoporosis    h/o  . Pneumonia   . Skin cancer    multiple skin cancers    MEDICATIONS:  Prior to Admission medications   Medication Sig Start Date End Date Taking? Authorizing Provider  acetaminophen (TYLENOL) 500 MG tablet Take 1,000 mg by mouth every 6 (six) hours as needed (for pain.).    [provider]  amLODipine (NORVASC) 5 MG tablet Take 5 mg by mouth every evening.    [provider]  aspirin EC 81 MG tablet Take 81 mg by mouth every evening.    [provider]  b complex vitamins tablet Take 1 tablet by mouth daily.     [provider]  Biotin 1 MG CAPS Take by mouth.    [provider]  cholecalciferol (VITAMIN D) 25 MCG (1000 UT) tablet Take 1,000 Units by mouth daily.    [provider]  cyclobenzaprine (FLEXERIL) 10 MG tablet Take 0.5 tablets (5 mg total) by mouth 3 (three) times daily as needed. 02/15/19   Eustace Moore, MD  estradiol (ESTRACE VAGINAL) 0.1 MG/GM vaginal cream Insert  0.5 Applicatorfuls vaginally 2 (two) times a week. Sundays & Wednesdays 08/05/19   Princess Bruins, MD  fish oil-omega-3 fatty acids 1000 MG capsule Take 1,000 mg by mouth 2 (two) times daily.     [provider]  folic acid (FOLVITE) 1 MG tablet Take 1 mg by mouth daily.      [provider]  gabapentin (NEURONTIN) 400 MG capsule Take 400 mg by mouth at bedtime.    [provider]  ibuprofen (ADVIL,MOTRIN) 200 MG tablet Take 400 mg by mouth every 8 (eight) hours as needed (pain).    [provider]  methocarbamol (ROBAXIN) 500 MG tablet Take 1 tablet (500 mg total) by mouth every 8 (eight) hours as needed for muscle spasms. 02/22/20   [name], DO  metroNIDAZOLE (METROGEL) 1 % gel Apply 1 application topically 2 (two) times daily. Applied to face 11/30/18   [provider]  Multiple Minerals-Vitamins (CAL MAG ZINC +D3 PO) Take  2 tablets by mouth 2 (two) times daily.    [provider]  Multiple Vitamin (MULTIVITAMIN WITH MINERALS) TABS tablet Take 1 tablet by mouth daily.    [provider]  nabumetone (RELAFEN) 500 MG tablet Take 1 tablet (500 mg total) by mouth 2 (two) times daily as needed. 02/22/20   [name], DO  psyllium (METAMUCIL) 58.6 % packet Take 1 packet by mouth daily.    [provider]  Zoledronic Acid (RECLAST IV) Inject 1 Dose into the vein once. Once a year    [provider]    ALLERGIES:  Allergies  Allergen Reactions  . Adhesive [Tape]   . Cefaclor Hives  . Fosamax [Alendronate  Sodium] Other (See Comments)    Gi upset  . Neomycin-Bacitracin Zn-Polymyx Rash    SOCIAL HISTORY:  Social History   Tobacco Use  . Smoking status: Never Smoker  . Smokeless tobacco: Never Used  Substance Use Topics  . Alcohol use: Not Currently    Alcohol/week: 0.3 standard drinks    FAMILY HISTORY: Family History  Problem Relation Age of Onset  . Hypertension Mother   . Leukemia Father   . Hypertension Father   . Diabetes Brother   . Hypertension Brother   . Hyperlipidemia Maternal Grandmother   . Cancer Maternal Grandfather   . Heart disease Paternal Grandfather   . Stroke Paternal Grandfather   . Breast cancer Maternal Aunt   . Breast cancer Paternal Aunt     EXAM: BP (!) 169/80 (BP Location: Left Arm)   Pulse 73   Temp 98.1 F (36.7 C) (Oral)   Resp 16   Ht 5\' 6"  (1.676 m)   Wt 46.3 kg   SpO2 100%   BMI 16.46 kg/m  CONSTITUTIONAL: Alert and oriented and responds appropriately to questions. Well-appearing; well-nourished, appears younger than stated age HEAD: Normocephalic EYES: Conjunctivae clear, pupils appear equal, EOM appear intact ENT: normal nose; moist mucous membranes, no dental pain or sign of dental abscess, no facial swelling or tenderness, no facial redness or warmth, no lymphadenopathy, masses or signs of sialoadenitis, normal phonation NECK: Supple, normal ROM, no lymphadenopathy, trachea midline CARD: RRR; S1 and S2 appreciated; no murmurs, no clicks, no rubs, no gallops RESP: Normal chest excursion without splinting or tachypnea; breath sounds clear and equal bilaterally; no wheezes, no rhonchi, no rales, no hypoxia or respiratory distress, speaking full sentences ABD/GI: Normal bowel sounds; non-distended; soft, non-tender, no rebound, no guarding, no peritoneal signs, no hepatosplenomegaly BACK:  The back appears normal EXT: Normal ROM in all joints; no deformity noted, no edema; no cyanosis, no calf tenderness or calf swelling SKIN: Normal  color for age and race; warm; no rash on exposed skin NEURO: Moves all extremities equally PSYCH: The patient's mood and manner are appropriate.   MEDICAL DECISION MAKING: Patient here with jaw pain, chest pain, nausea, diaphoresis.  No chest pain currently but still having some jaw pain.  Will give aspirin, nitroglycerin.  EKG reviewed/interpreted and shows anterior lateral ST abnormalities but is unchanged compared EKG in March 2020.  Does not meet STEMI criteria.  Labs reviewed/interpreted and are reassuring.  She has had 2 flat high-sensitivity troponins here at 3 and 6 but gives a concerning story with risk factors for ACS.  Chest x-ray reviewed/interpreted and shows no acute abnormality.  I feel she would benefit from admission from chest pain rule out.  She is comfortable with this plan.  Doubt PE, dissection, pneumothorax, pneumonia, COVID-19.  2:23 AM Discussed patient's case with hospitalist, Dr. Hal Hope.  I have recommended admission and patient (and family if present) agree with this plan. Admitting physician will place admission orders.   I reviewed all nursing notes, vitals, pertinent previous records and reviewed/interpreted all EKGs, lab and urine results, imaging (as available).    EKG Interpretation  Date/Time:  Friday Apr 20 2020 01:56:31 EDT Ventricular Rate:  67 PR Interval:    QRS Duration: 94 QT Interval:  439 QTC Calculation: 464 R Axis:   80 Text Interpretation: Sinus rhythm LAE, consider biatrial enlargement RSR' in V1 or V2, right VCD or RVH Abnrm T, consider ischemia, anterolateral lds No significant change since last tracing March 2020 Confirmed by Pryor Curia [phone]) on 04/20/2020 2:17:11 AM         Tracey [name] was evaluated in Emergency Department on 04/20/2020 for the symptoms described in the history of present illness. She was evaluated in the context of the global COVID-19 pandemic, which necessitated consideration that the patient might be  at risk for infection with the SARS-CoV-2 virus that causes COVID-19. Institutional protocols and algorithms that pertain to the evaluation of patients at risk for COVID-19 are in a state of rapid change based on information released by regulatory bodies including the CDC and federal and state organizations. These policies and algorithms were followed during the patient's care in the ED.      Karon Cotterill, Delice Bison, DO 04/20/20 [phone]

## 2020-04-20 NOTE — Progress Notes (Signed)
  Echocardiogram 2D Echocardiogram has been performed.  Tracey [name] 04/20/2020, 2:02 PM

## 2020-04-20 NOTE — ED Notes (Signed)
Lunch Tray Ordered @ 1055. 

## 2020-04-20 NOTE — Consult Note (Addendum)
Cardiology Consultation:   Patient ID: Tracey [name] MRN: [id number]; DOB: Feb 13, 1951  Admit date: 04/19/2020 Date of Consult: 04/20/2020  Primary Care Provider: [name], MD Primary Cardiologist: No primary care provider on file. new Primary Electrophysiologist:  None    Patient Profile:   Tracey [name] is a 69 y.o. female with a hx of HTN, osteoporosis who is being seen today for the evaluation of chest pain at the request of Dr. Rodena Piety.  History of Present Illness:   [name] with no cardiac hx, + HTN and has been experiencing Lt jaw and neck pain for about 1 week.  Constant and at rest.  yesteday after going to bathroom she was on side of bed, became diaphoretic and and retrosternal chest pressure - lasted 25-30 min and resolved but would come episodically since.    EKG:  The EKG was personally reviewed and demonstrates:  SR with inverted T waves V1-2 stable EKG form 2020  Telemetry:  Telemetry was personally reviewed and demonstrates:  SR with occ PVC  troponin 3, 6  Na 141, K+ 3.8, BUN 18, Cr 1.15 , Hepatic normal.  Tchol 228, LDL 120 HDL 97  Hgb 13.8  ddimer 0.27 2V CXR clear   BP 150/93, P 70s to 80s  She has rec'd ASA and NTG X 2.  (at urgent care 04/20/19 BP was 154/81   Resting now,  Echo in 2014 was done for PVCs she believes.    Will repeat echo  Past Medical History:  Diagnosis Date  . Closed hip fracture ([place]) 03/2016   RT HIP  . Hypertension   . Osteopenia   . Osteoporosis    h/o  . Pneumonia   . Skin cancer    multiple skin cancers    Past Surgical History:  Procedure Laterality Date  . CALDWELL LUC    . COLONOSCOPY WITH PROPOFOL N/A 03/22/2014   Procedure: COLONOSCOPY WITH PROPOFOL;  Surgeon: Arta Silence, MD;  Location: [facility];  Service: Endoscopy;  Laterality: N/A;  . HIP PINNING,CANNULATED Right 03/21/2016   Procedure: CANNULATED HIP PINNING;  Surgeon: Rod Can, MD;  Location: [place];  Service: Orthopedics;  Laterality:  Right;  . HIP SURGERY    . LUMBAR LAMINECTOMY/DECOMPRESSION MICRODISCECTOMY Left 02/14/2019   Procedure: Laminectomy and Foraminotomy - L5-S1 - left;  Surgeon: Eustace Moore, MD;  Location: [place];  Service: Neurosurgery;  Laterality: Left;  Laminectomy and Foraminotomy - L5-S1 - left  . NASAL SEPTUM SURGERY     past hx. deviated septum  . TONSILLECTOMY       Home Medications:  Prior to Admission medications   Medication Sig Start Date End Date Taking? Authorizing Provider  acetaminophen (TYLENOL) 500 MG tablet Take 1,000 mg by mouth every 6 (six) hours as needed (for pain.).   Yes [provider]  amLODipine (NORVASC) 5 MG tablet Take 5 mg by mouth every evening.   Yes [provider]  aspirin EC 81 MG tablet Take 81 mg by mouth every evening.   Yes [provider]  b complex vitamins tablet Take 1 tablet by mouth daily.   Yes [provider]  Biotin 1 MG CAPS Take 1 capsule by mouth daily.    Yes [provider]  cholecalciferol (VITAMIN D) 25 MCG (1000 UT) tablet Take 1,000 Units by mouth daily.   Yes [provider]  estradiol (ESTRACE VAGINAL) 0.1 MG/GM vaginal cream Insert  0.5 Applicatorfuls vaginally 2 (two) times a week. Sundays &  Wednesdays 08/05/19  Yes Princess Bruins, MD  fish oil-omega-3 fatty acids 1000 MG capsule Take 1,000 mg by mouth 2 (two) times daily.    Yes [provider]  folic acid (FOLVITE) 1 MG tablet Take 1 mg by mouth daily.     Yes [provider]  gabapentin (NEURONTIN) 400 MG capsule Take 400 mg by mouth at bedtime.   Yes [provider]  ibuprofen (ADVIL,MOTRIN) 200 MG tablet Take 400 mg by mouth every 8 (eight) hours as needed (pain).   Yes [provider]  methocarbamol (ROBAXIN) 500 MG tablet Take 1 tablet (500 mg total) by mouth every 8 (eight) hours as needed for muscle spasms. 02/22/20  Yes [name], DO  metroNIDAZOLE (METROGEL) 1 % gel Apply 1 application  topically 2 (two) times daily. Applied to face 11/30/18  Yes [provider]  Multiple Minerals-Vitamins (CAL MAG ZINC +D3 PO) Take 2 tablets by mouth 2 (two) times daily.   Yes [provider]  nabumetone (RELAFEN) 500 MG tablet Take 1 tablet (500 mg total) by mouth 2 (two) times daily as needed. Patient taking differently: Take 500 mg by mouth 2 (two) times daily as needed for mild pain.  02/22/20  Yes [name], DO  Polyethyl Glycol-Propyl Glycol (SYSTANE OP) Place 1 drop into both eyes 2 (two) times daily as needed (Dry/Irritated eyes).   Yes [provider]  psyllium (METAMUCIL) 58.6 % packet Take 1 packet by mouth daily.   Yes [provider]  cyclobenzaprine (FLEXERIL) 10 MG tablet Take 0.5 tablets (5 mg total) by mouth 3 (three) times daily as needed. Patient not taking: Reported on 04/20/2020 02/15/19   Eustace Moore, MD  Multiple Vitamin (MULTIVITAMIN WITH MINERALS) TABS tablet Take 1 tablet by mouth daily.    [provider]  Zoledronic Acid (RECLAST IV) Inject 1 Dose into the vein once. Once a year    [provider]    Inpatient Medications: Scheduled Meds: . amLODipine  5 mg Oral QPM  . aspirin  324 mg Oral Once  . [START ON 04/21/2020] aspirin EC  81 mg Oral QPM  . enoxaparin (LOVENOX) injection  40 mg Subcutaneous Q24H  . gabapentin  400 mg Oral QHS  . metoprolol tartrate  100 mg Oral Once  . sodium chloride flush  3 mL Intravenous Once   Continuous Infusions:  PRN Meds: acetaminophen, nitroGLYCERIN, ondansetron (ZOFRAN) IV  Allergies:    Allergies  Allergen Reactions  . Adhesive [Tape]   . Cefaclor Hives  . Fosamax [Alendronate Sodium] Other (See Comments)    Gi upset  . Neomycin-Bacitracin Zn-Polymyx Rash    Social History:   Social History   Socioeconomic History  . Marital status: Married    Spouse name: Coralyn Akela Pocius  . Number of children: 1  . Years of education: Not on file  . Highest education  level: Not on file  Occupational History  . Occupation: MED TECH    Employer: [employer]  Tobacco Use  . Smoking status: Never Smoker  . Smokeless tobacco: Never Used  Substance and Sexual Activity  . Alcohol use: Not Currently    Alcohol/week: 0.3 standard drinks  . Drug use: No  . Sexual activity: Yes    Partners: Male    Comment: married, [place]  Other Topics Concern  . Not on file  Social History Narrative  . Not on file   Social Determinants of Health   Financial Resource Strain:   .  Difficulty of Paying Living Expenses:   Food Insecurity:   . Worried About Charity fundraiser in the Last Year:   . Arboriculturist in the Last Year:   Transportation Needs:   . Film/video editor (Medical):   Marland Kitchen Lack of Transportation (Non-Medical):   Physical Activity:   . Days of Exercise per Week:   . Minutes of Exercise per Session:   Stress:   . Feeling of Stress :   Social Connections:   . Frequency of Communication with Friends and Family:   . Frequency of Social Gatherings with Friends and Family:   . Attends Religious Services:   . Active Member of Clubs or Organizations:   . Attends Archivist Meetings:   Marland Kitchen Marital Status:   Intimate Partner Violence:   . Fear of Current or Ex-Partner:   . Emotionally Abused:   Marland Kitchen Physically Abused:   . Sexually Abused:     Family History:   Family History  Problem Relation Age of Onset  . Hypertension Mother   . Leukemia Father   . Hypertension Father   . Diabetes Brother   . Hypertension Brother   . Hyperlipidemia Maternal Grandmother   . Cancer Maternal Grandfather   . Heart disease Paternal Grandfather   . Stroke Paternal Grandfather   . Breast cancer Maternal Aunt   . Breast cancer Paternal Aunt      ROS:  Please see the history of present illness.  General:no colds or fevers, no weight changes Skin:no rashes or ulcers HEENT:no blurred vision, no congestion CV:see HPI PUL:see HPI GI:no  diarrhea constipation or melena, no indigestion GU:no hematuria, no dysuria MS:no joint pain, no claudication Neuro:no syncope, no lightheadedness Endo:no diabetes, no thyroid disease  All other ROS reviewed and negative.     Physical Exam/Data:   Vitals:   04/20/20 1015 04/20/20 1045 04/20/20 1100 04/20/20 1200  BP:    (!) 122/91  Pulse: 69 78 72 81  Resp: 14 19 14  (!) 22  Temp:      TempSrc:      SpO2:      Weight:      Height:       No intake or output data in the 24 hours ending 04/20/20 1333 Last 3 Weights 04/19/2020 03/21/2020 02/22/2020  Weight (lbs) 102 lb 110 lb 110 lb  Weight (kg) 46.267 kg 49.896 kg 49.896 kg     Body mass index is 16.46 kg/m.  General:  Thin female, in no acute distress HEENT: normal Lymph: no adenopathy Neck: no JVD Endocrine:  No thryomegaly Vascular: No carotid bruits; pedal pulses 2+ bilaterally   Cardiac:  normal S1, S2; RRR; no murmur gallup rub click  Lungs:  clear to auscultation bilaterally, no wheezing, rhonchi or rales  Abd: soft, nontender, no hepatomegaly  Ext: no edema Musculoskeletal:  No deformities, BUE and BLE strength normal and equal Skin: warm and dry  Neuro:  , no focal abnormalities noted Psych:  Normal affect    Relevant CV Studies: Prior echo in 2016 normal.    Laboratory Data:  High Sensitivity Troponin:   Recent Labs  Lab 04/19/20 2126 04/20/20 0001  TROPONINIHS 3 6     Chemistry Recent Labs  Lab 04/19/20 2126 04/20/20 0543  NA 141  --   K 3.8  --   CL 101  --   CO2 30  --   GLUCOSE 121*  --   BUN 18  --  CREATININE 1.15* 0.94  CALCIUM 10.2  --   GFRNONAA 48* >60  GFRAA 56* >60  ANIONGAP 10  --     Recent Labs  Lab 04/20/20 0543  PROT 6.9  ALBUMIN 3.9  AST 25  ALT 21  ALKPHOS 46  BILITOT 1.0   Hematology Recent Labs  Lab 04/19/20 2126 04/20/20 0543  WBC 5.8 4.6  RBC 4.97 4.78  HGB 14.4 13.8  HCT 43.6 41.5  MCV 87.7 86.8  MCH 29.0 28.9  MCHC 33.0 33.3  RDW 13.2 13.2    PLT 210 201   BNPNo results for input(s): BNP, PROBNP in the last 168 hours.  DDimer  Recent Labs  Lab 04/20/20 0543  DDIMER 0.27     Radiology/Studies:  DG Chest 2 View  Result Date: 04/19/2020 CLINICAL DATA:  Left-sided chest pain radiating to the jaw with nausea x1 day. EXAM: CHEST - 2 VIEW COMPARISON:  February 10, 2019 FINDINGS: The lungs are hyperinflated. There is no evidence of acute infiltrate, pleural effusion or pneumothorax. The heart size and mediastinal contours are within normal limits. The visualized skeletal structures are unremarkable. IMPRESSION: No active cardiopulmonary disease. Electronically Signed   By: Virgina Norfolk M.D.   On: 04/19/2020 21:38       HEAR Score (for undifferentiated chest pain):     6  Assessment and Plan:   1. Chest pain with HTN, age, and FH of CAD, plan for cardiac CTA to eval. For stenosis with normal troponin and no acute EKG changes.  Will order echo as well. Will give lopressor 100 mg to control HR  For CTA, radiology thought they would get to today. 2. HTN elevated on amlodipine, will be lower with once time dose of BB 3. HLD with LDL 122, may need statin      For questions or updates, please contact [place] Please consult [email or web address] for contact info under     Signed, Candee Furbish, MD  04/20/2020 1:33 PM  Personally seen and examined. Agree with above.   69 year old with left sided neck pain jaw pain diaphoresis and recent retrosternal pressure with T wave inversion noted in aVL as well as V1 and V2.  Troponins are normal.  Currently pain-free.  GEN: Thin, in no acute distress  HEENT: normal  Neck: no JVD, carotid bruits, or masses Cardiac: RRR; no murmurs, rubs, or gallops,no edema  Respiratory:  clear to auscultation bilaterally, normal work of breathing GI: soft, nontender, nondistended, + BS MS: no deformity or atrophy  Skin: warm and dry, no rash Neuro:  Alert and Oriented x 3, Strength and sensation  are intact Psych: euthymic mood, full affect  Chest x-ray unremarkable personally reviewed normal heart.  Assessment and plan:  Chest pain -T wave inversions noted on ECG as described above, family history of coronary artery disease.  Plan will be to proceed with coronary CTA for further evaluation.  We are currently discussing with CT for availability.  P.o. Lopressor 100 mg will be administered for heart rate control.  Essential hypertension -Currently on amlodipine.  Continue to monitor.  Hyperlipidemia -LDL 122, if there is coronary artery disease-would encourage statin  Candee Furbish, MD

## 2020-04-21 ENCOUNTER — Observation Stay (HOSPITAL_COMMUNITY): Payer: PPO

## 2020-04-21 DIAGNOSIS — R0789 Other chest pain: Secondary | ICD-10-CM

## 2020-04-21 DIAGNOSIS — Z20822 Contact with and (suspected) exposure to covid-19: Secondary | ICD-10-CM | POA: Diagnosis not present

## 2020-04-21 DIAGNOSIS — E785 Hyperlipidemia, unspecified: Secondary | ICD-10-CM | POA: Diagnosis not present

## 2020-04-21 DIAGNOSIS — Z79899 Other long term (current) drug therapy: Secondary | ICD-10-CM | POA: Diagnosis not present

## 2020-04-21 DIAGNOSIS — R110 Nausea: Secondary | ICD-10-CM | POA: Diagnosis not present

## 2020-04-21 DIAGNOSIS — I119 Hypertensive heart disease without heart failure: Secondary | ICD-10-CM | POA: Diagnosis not present

## 2020-04-21 DIAGNOSIS — Z7982 Long term (current) use of aspirin: Secondary | ICD-10-CM | POA: Diagnosis not present

## 2020-04-21 DIAGNOSIS — Z79890 Hormone replacement therapy (postmenopausal): Secondary | ICD-10-CM | POA: Diagnosis not present

## 2020-04-21 DIAGNOSIS — Z888 Allergy status to other drugs, medicaments and biological substances status: Secondary | ICD-10-CM | POA: Diagnosis not present

## 2020-04-21 DIAGNOSIS — R079 Chest pain, unspecified: Secondary | ICD-10-CM | POA: Diagnosis not present

## 2020-04-21 DIAGNOSIS — Z881 Allergy status to other antibiotic agents status: Secondary | ICD-10-CM | POA: Diagnosis not present

## 2020-04-21 MED ORDER — ROSUVASTATIN CALCIUM 20 MG PO TABS
20.0000 mg | ORAL_TABLET | Freq: Every day | ORAL | 11 refills | Status: DC
Start: 2020-04-21 — End: 2020-08-09

## 2020-04-21 NOTE — Discharge Summary (Signed)
Physician Discharge Summary  Tracey [name] [id number] DOB: 05/26/1951 DOA: 04/19/2020  PCP: [name], MD  Admit date: 04/19/2020 Discharge date: 04/21/2020  Admitted From: Home Disposition: Home Recommendations for Outpatient Follow-up:  1. Follow up with PCP in 1-2 weeks 2. Please obtain BMP/CBC in one week 3. Follow-up with Dr. Marlou Porch  Home Health: None  equipment/Devices: None  Discharge Condition stable CODE STATUS: Full code Diet recommendation: Cardiac Brief/Interim Summary: 69 y.o. female with history of hypertension presents to the ER because of chest pain.  Patient states over the last 1 week patient has been experiencing left jaw and neck pain which is present even at rest.  Yesterday patient after going to the bathroom and sitting on the bed patient became diaphoretic started having retrosternal chest pressure.  Also some pain around the right side of the chest.  This lasted for around 25 to 30 minutes and resolved.  But has been recurring the whole day off and on.  Given the symptoms patient presents to the ER.  Has some nausea denies any abdominal pain.  ED Course: In the ER chest x-ray was unremarkable EKG shows nonspecific T wave changes cardiac markers were negative.  Patient admitted for further management of chest pain.  Covid test is negative.  CBC unremarkable creatinine 1.1.  Discharge Diagnoses:  Principal Problem:   Chest pain Active Problems:   Essential hypertension   #1 chest pain with negative cardiac markers and no acute EKG changes.  She was seen by cardiology Dr. Marlou Porch and underwent the CTA of the coronaries and it shows possible flow limitation and small distal diagonal branch other areas are normal.  Recommended medical management with aspirin 81 mg daily with Crestor 20 mg daily.  Her LDL was 120.  #2 hypertension continue amlodipine  #3 hyperlipidemia LDL 120 continue Crestor. Estimated body mass index is 16.46 kg/m as calculated from  the following:   Height as of this encounter: 5\' 6"  (1.676 m).   Weight as of this encounter: 46.3 kg.  Discharge Instructions  Discharge Instructions    Diet - low sodium heart healthy   Complete by: As directed    Increase activity slowly   Complete by: As directed      Allergies as of 04/21/2020      Reactions   Adhesive [tape]    Cefaclor Hives   Fosamax [alendronate Sodium] Other (See Comments)   Gi upset   Neomycin-bacitracin Zn-polymyx Rash      Medication List    STOP taking these medications   cyclobenzaprine 10 MG tablet Commonly known as: FLEXERIL   ibuprofen 200 MG tablet Commonly known as: ADVIL     TAKE these medications   acetaminophen 500 MG tablet Commonly known as: TYLENOL Take 1,000 mg by mouth every 6 (six) hours as needed (for pain.).   amLODipine 5 MG tablet Commonly known as: NORVASC Take 5 mg by mouth every evening.   aspirin EC 81 MG tablet Take 81 mg by mouth every evening.   b complex vitamins tablet Take 1 tablet by mouth daily.   Biotin 1 MG Caps Take 1 capsule by mouth daily.   CAL MAG ZINC +D3 PO Take 2 tablets by mouth 2 (two) times daily.   cholecalciferol 25 MCG (1000 UNIT) tablet Commonly known as: VITAMIN D Take 1,000 Units by mouth daily.   estradiol 0.1 MG/GM vaginal cream Commonly known as: ESTRACE VAGINAL Insert  0.5 Applicatorfuls vaginally 2 (two) times a week. Sundays & Wednesdays  fish oil-omega-3 fatty acids 1000 MG capsule Take 1,000 mg by mouth 2 (two) times daily.   folic acid 1 MG tablet Commonly known as: FOLVITE Take 1 mg by mouth daily.   gabapentin 400 MG capsule Commonly known as: NEURONTIN Take 400 mg by mouth at bedtime.   methocarbamol 500 MG tablet Commonly known as: ROBAXIN Take 1 tablet (500 mg total) by mouth every 8 (eight) hours as needed for muscle spasms.   metroNIDAZOLE 1 % gel Commonly known as: METROGEL Apply 1 application topically 2 (two) times daily. Applied to face    multivitamin with minerals Tabs tablet Take 1 tablet by mouth daily.   nabumetone 500 MG tablet Commonly known as: Relafen Take 1 tablet (500 mg total) by mouth 2 (two) times daily as needed. What changed: reasons to take this   psyllium 58.6 % packet Commonly known as: METAMUCIL Take 1 packet by mouth daily.   RECLAST IV Inject 1 Dose into the vein once. Once a year   rosuvastatin 20 MG tablet Commonly known as: Crestor Take 1 tablet (20 mg total) by mouth daily.   SYSTANE OP Place 1 drop into both eyes 2 (two) times daily as needed (Dry/Irritated eyes).      Follow-up Information    [name], MD Follow up.   Specialty: Internal Medicine Contact information: [address] [place] [address] [phone]        Jerline Pain, MD Follow up.   Specialty: Cardiology Contact information: [id number] [place] [address] [phone]          Allergies  Allergen Reactions  . Adhesive [Tape]   . Cefaclor Hives  . Fosamax [Alendronate Sodium] Other (See Comments)    Gi upset  . Neomycin-Bacitracin Zn-Polymyx Rash    Consultations:  Dr. Marlou Porch   Procedures/Studies: DG Chest 2 View  Result Date: 04/19/2020 CLINICAL DATA:  Left-sided chest pain radiating to the jaw with nausea x1 day. EXAM: CHEST - 2 VIEW COMPARISON:  February 10, 2019 FINDINGS: The lungs are hyperinflated. There is no evidence of acute infiltrate, pleural effusion or pneumothorax. The heart size and mediastinal contours are within normal limits. The visualized skeletal structures are unremarkable. IMPRESSION: No active cardiopulmonary disease. Electronically Signed   By: Virgina Norfolk M.D.   On: 04/19/2020 21:38   CT CORONARY MORPH W/CTA COR W/SCORE W/CA W/CM &/OR WO/CM  Result Date: 04/20/2020 CLINICAL DATA:  69 year old with chest pain, abnormal ECG, family history of CAD EXAM: Cardiac/Coronary  CTA TECHNIQUE: The patient was scanned on a  [employer]. FINDINGS: A 100 kV prospective scan was triggered in the descending thoracic aorta at 111 HU's. Axial non-contrast 3 mm slices were carried out through the heart. The data set was analyzed on a dedicated work station and scored using the [place]. Gantry rotation speed was 250 msecs and collimation was .6 mm. 100mg  beta blockade and 0.8 mg of sl NTG was given. The 3D data set was reconstructed in 5% intervals of the 67-82 % of the R-R cycle. Diastolic phases were analyzed on a dedicated work station using MPR, MIP and VRT modes. The patient received 80 cc of contrast. Aorta:  Normal size.  No calcifications.  No dissection. Aortic Valve:  Trileaflet.  No calcifications. Coronary Arteries:  Normal coronary origin.  Right dominance. RCA is a large dominant artery that gives rise to PDA and PLA. There is scattered calcified and non calcified plaque (  mostly non flow limiting 0-24%, however there is a mid focal lesion of 50-74%). Will send for CT FFR analysis. Left main is a large artery that gives rise to LAD and LCX arteries. LAD is a large vessel that has proximal calcified and non calcified plaque of 0-24%. There is a focal calcified lesion at first diagonal branch 0-24%). LAD then wraps around apex. LCX is a non-dominant artery that gives rise to one large OM1 branch. There is a focal calcified plaque at the bifurcation of OM and small AV groove circumflex that is 25-49%. Other findings: Normal pulmonary vein drainage into the left atrium. Normal left atrial appendage without a thrombus. Normal size of the pulmonary artery. Please see radiology report for non cardiac findings. IMPRESSION: 1. Coronary calcium score of 704. This was 95 percentile for age and sex matched control. 2. Normal coronary origin with right dominance. 3. Non flow limiting LAD calcified plaque (0-24%), Moderate calcified plaque in circumflex distribution at bifurcation of OM and AV groove circumflex (25-49%) and  scattered calcified lesions in proximal and mid RCA up to 50-74% which may be flow limiting. Sending for CT FFR analysis. Candee Furbish, MD [facility] Electronically Signed   By: Candee Furbish MD   On: 04/20/2020 18:31   ECHOCARDIOGRAM COMPLETE  Result Date: 04/20/2020    ECHOCARDIOGRAM REPORT   Patient Name:   EVEREST LOO [name] Date of Exam: 04/20/2020 Medical Rec #:  [id number]       Height:       66.0 in Accession #:    [id number]      Weight:       102.0 lb Date of Birth:  January 02, 1951       BSA:          1.502 m Patient Age:    39 years        BP:           122/91 mmHg Patient Gender: F               HR:           81 bpm. Exam Location:  Inpatient Procedure: 2D Echo, Color Doppler and Cardiac Doppler Indications:    Chest pain  History:        Patient has prior history of Echocardiogram examinations, most                 recent 06/14/2013. Risk Factors:Hypertension. GERD.  Sonographer:    Clayton Lefort RDCS (AE) Referring Phys: Tira Comments: Suboptimal parasternal window and suboptimal subcostal window. Image acquisition challenging due to respiratory motion. IMPRESSIONS  1. Left ventricular ejection fraction, by estimation, is 60 to 65%. The left ventricle has normal function. The left ventricle has no regional wall motion abnormalities. There is moderate left ventricular hypertrophy. Left ventricular diastolic parameters are consistent with Grade II diastolic dysfunction (pseudonormalization).  2. Right ventricular systolic function is normal. The right ventricular size is normal.  3. The mitral valve is normal in structure. No evidence of mitral valve regurgitation. No evidence of mitral stenosis.  4. The aortic valve has an indeterminant number of cusps. Aortic valve regurgitation is not visualized. No aortic stenosis is present. FINDINGS  Left Ventricle: Left ventricular ejection fraction, by estimation, is 60 to 65%. The left ventricle has normal function. The left ventricle has no regional  wall motion abnormalities. The left ventricular internal cavity size was normal in size. There is  moderate left ventricular hypertrophy. Left  ventricular diastolic parameters are consistent with Grade II diastolic dysfunction (pseudonormalization). Right Ventricle: The right ventricular size is normal. No increase in right ventricular wall thickness. Right ventricular systolic function is normal. Left Atrium: Left atrial size was normal in size. Right Atrium: Right atrial size was normal in size. Pericardium: There is no evidence of pericardial effusion. Mitral Valve: The mitral valve is normal in structure. Normal mobility of the mitral valve leaflets. No evidence of mitral valve regurgitation. No evidence of mitral valve stenosis. MV peak gradient, 2.4 mmHg. The mean mitral valve gradient is 1.0 mmHg. Tricuspid Valve: The tricuspid valve is normal in structure. Tricuspid valve regurgitation is trivial. No evidence of tricuspid stenosis. Aortic Valve: The aortic valve has an indeterminant number of cusps. Aortic valve regurgitation is not visualized. No aortic stenosis is present. Aortic valve mean gradient measures 2.0 mmHg. Aortic valve peak gradient measures 3.0 mmHg. Aortic valve area, by VTI measures 2.59 cm. Pulmonic Valve: The pulmonic valve was grossly normal. Pulmonic valve regurgitation is not visualized. No evidence of pulmonic stenosis. Aorta: The aortic root is normal in size and structure. Venous: The inferior vena cava was not well visualized. IAS/Shunts: No atrial level shunt detected by color flow Doppler.  LEFT VENTRICLE PLAX 2D LVIDd:         2.20 cm  Diastology LVIDs:         1.54 cm  LV e' lateral:   7.72 cm/s LV PW:         1.35 cm  LV E/e' lateral: 8.2 LV IVS:        1.32 cm  LV e' medial:    5.55 cm/s LVOT diam:     2.00 cm  LV E/e' medial:  11.4 LV SV:         45 LV SV Index:   30 LVOT Area:     3.14 cm  RIGHT VENTRICLE RV Basal diam:  2.24 cm RV S prime:     8.18 cm/s TAPSE (M-mode):  1.7 cm LEFT ATRIUM             Index      RIGHT ATRIUM          Index LA diam:        2.30 cm 1.53 cm/m RA Area:     6.44 cm LA Vol (A2C):   8.8 ml  5.84 ml/m RA Volume:   9.17 ml  6.10 ml/m LA Vol (A4C):   9.2 ml  6.13 ml/m LA Biplane Vol: 9.3 ml  6.18 ml/m  AORTIC VALVE AV Area (Vmax):    2.60 cm AV Area (Vmean):   2.26 cm AV Area (VTI):     2.59 cm AV Vmax:           86.20 cm/s AV Vmean:          71.100 cm/s AV VTI:            0.175 m AV Peak Grad:      3.0 mmHg AV Mean Grad:      2.0 mmHg LVOT Vmax:         71.30 cm/s LVOT Vmean:        51.200 cm/s LVOT VTI:          0.144 m LVOT/AV VTI ratio: 0.82  AORTA Ao Root diam: 2.90 cm MITRAL VALVE MV Area (PHT): 3.60 cm    SHUNTS MV Peak grad:  2.4 mmHg    Systemic VTI:  0.14 m MV Mean grad:  1.0 mmHg    Systemic Diam: 2.00 cm MV Vmax:       0.78 m/s MV Vmean:      45.4 cm/s MV Decel Time: 211 msec MV E velocity: 63.30 cm/s MV A velocity: 66.40 cm/s MV E/A ratio:  0.95 Cherlynn Kaiser MD Electronically signed by Cherlynn Kaiser MD Signature Date/Time: 04/20/2020/11:44:51 PM    Final     (Echo, Carotid, EGD, Colonoscopy, ERCP)    Subjective: Patient resting in bed in no acute distress denies any further chest discomfort nausea vomiting  Discharge Exam: Vitals:   04/21/20 0811 04/21/20 1144  BP: 113/66 105/64  Pulse: 60 (!) 56  Resp: 19 16  Temp: 98.6 F (37 C) 98 F (36.7 C)  SpO2: 100% 98%   Vitals:   04/20/20 2250 04/21/20 0510 04/21/20 0811 04/21/20 1144  BP: 111/64 105/67 113/66 105/64  Pulse: 63 60 60 (!) 56  Resp: 19 16 19 16   Temp: 98.1 F (36.7 C) 98.2 F (36.8 C) 98.6 F (37 C) 98 F (36.7 C)  TempSrc: Oral Oral Oral Oral  SpO2: 97% 99% 100% 98%  Weight:      Height:        General: Pt is alert, awake, not in acute distress Cardiovascular: RRR, S1/S2 +, no rubs, no gallops Respiratory: CTA bilaterally, no wheezing, no rhonchi Abdominal: Soft, NT, ND, bowel sounds + Extremities: no edema, no cyanosis    The  results of significant diagnostics from this hospitalization (including imaging, microbiology, ancillary and laboratory) are listed below for reference.     Microbiology: Recent Results (from the past 240 hour(s))  SARS Coronavirus 2 by RT PCR (hospital order, performed in [facility] hospital lab) Nasopharyngeal Nasopharyngeal Swab     Status: None   Collection Time: 04/20/20  3:43 AM   Specimen: Nasopharyngeal Swab  Result Value Ref Range Status   SARS Coronavirus 2 NEGATIVE NEGATIVE Final    Comment: (NOTE) SARS-CoV-2 target nucleic acids are NOT DETECTED. The SARS-CoV-2 RNA is generally detectable in upper and lower respiratory specimens during the acute phase of infection. The lowest concentration of SARS-CoV-2 viral copies this assay can detect is 250 copies / mL. A negative result does not preclude SARS-CoV-2 infection and should not be used as the sole basis for treatment or other patient management decisions.  A negative result may occur with improper specimen collection / handling, submission of specimen other than nasopharyngeal swab, presence of viral mutation(s) within the areas targeted by this assay, and inadequate number of viral copies (<250 copies / mL). A negative result must be combined with clinical observations, patient history, and epidemiological information. Fact Sheet for Patients:   StrictlyIdeas.no Fact Sheet for Healthcare Providers: BankingDealers.co.za This test is not yet approved or cleared  by the [place] FDA and has been authorized for detection and/or diagnosis of SARS-CoV-2 by FDA under an Emergency Use Authorization (EUA).  This EUA will remain in effect (meaning this test can be used) for the duration of the COVID-19 declaration under Section 564(b)(1) of the Act, 21 U.S.C. section 360bbb-3(b)(1), unless the authorization is terminated or revoked sooner. Performed at [facility],  [place] [address] [place], [place] [address]      Labs: BNP (last 3 results) No results for input(s): BNP in the last 8760 hours. Basic Metabolic Panel: Recent Labs  Lab 04/19/20 2126 04/20/20 0543  NA 141  --   K 3.8  --   CL 101  --  CO2 30  --   GLUCOSE 121*  --   BUN 18  --   CREATININE 1.15* 0.94  CALCIUM 10.2  --    Liver Function Tests: Recent Labs  Lab 04/20/20 0543  AST 25  ALT 21  ALKPHOS 46  BILITOT 1.0  PROT 6.9  ALBUMIN 3.9   No results for input(s): LIPASE, AMYLASE in the last 168 hours. No results for input(s): AMMONIA in the last 168 hours. CBC: Recent Labs  Lab 04/19/20 2126 04/20/20 0543  WBC 5.8 4.6  HGB 14.4 13.8  HCT 43.6 41.5  MCV 87.7 86.8  PLT 210 201   Cardiac Enzymes: No results for input(s): CKTOTAL, CKMB, CKMBINDEX, TROPONINI in the last 168 hours. BNP: Invalid input(s): POCBNP CBG: No results for input(s): GLUCAP in the last 168 hours. D-Dimer Recent Labs    04/20/20 0543  DDIMER 0.27   Hgb A1c No results for input(s): HGBA1C in the last 72 hours. Lipid Profile Recent Labs    04/20/20 0650  CHOL 228*  HDL 97  LDLCALC 120*  TRIG 55  CHOLHDL 2.4   Thyroid function studies No results for input(s): TSH, T4TOTAL, T3FREE, THYROIDAB in the last 72 hours.  Invalid input(s): FREET3 Anemia work up No results for input(s): VITAMINB12, FOLATE, FERRITIN, TIBC, IRON, RETICCTPCT in the last 72 hours. Urinalysis    Component Value Date/Time   COLORURINE YELLOW 03/20/2016 2203   APPEARANCEUR CLEAR 03/20/2016 2203   LABSPEC 1.008 03/20/2016 2203   PHURINE 8.0 03/20/2016 2203   GLUCOSEU NEGATIVE 03/20/2016 2203   HGBUR NEGATIVE 03/20/2016 2203   BILIRUBINUR NEGATIVE 03/20/2016 2203   KETONESUR 15 (A) 03/20/2016 2203   PROTEINUR NEGATIVE 03/20/2016 2203   NITRITE NEGATIVE 03/20/2016 2203   LEUKOCYTESUR SMALL (A) 03/20/2016 2203   Sepsis Labs Invalid input(s): PROCALCITONIN,  WBC,  LACTICIDVEN Microbiology Recent Results  (from the past 240 hour(s))  SARS Coronavirus 2 by RT PCR (hospital order, performed in [place] hospital lab) Nasopharyngeal Nasopharyngeal Swab     Status: None   Collection Time: 04/20/20  3:43 AM   Specimen: Nasopharyngeal Swab  Result Value Ref Range Status   SARS Coronavirus 2 NEGATIVE NEGATIVE Final    Comment: (NOTE) SARS-CoV-2 target nucleic acids are NOT DETECTED. The SARS-CoV-2 RNA is generally detectable in upper and lower respiratory specimens during the acute phase of infection. The lowest concentration of SARS-CoV-2 viral copies this assay can detect is 250 copies / mL. A negative result does not preclude SARS-CoV-2 infection and should not be used as the sole basis for treatment or other patient management decisions.  A negative result may occur with improper specimen collection / handling, submission of specimen other than nasopharyngeal swab, presence of viral mutation(s) within the areas targeted by this assay, and inadequate number of viral copies (<250 copies / mL). A negative result must be combined with clinical observations, patient history, and epidemiological information. Fact Sheet for Patients:   StrictlyIdeas.no Fact Sheet for Healthcare Providers: BankingDealers.co.za This test is not yet approved or cleared  by the [place] FDA and has been authorized for detection and/or diagnosis of SARS-CoV-2 by FDA under an Emergency Use Authorization (EUA).  This EUA will remain in effect (meaning this test can be used) for the duration of the COVID-19 declaration under Section 564(b)(1) of the Act, 21 U.S.C. section 360bbb-3(b)(1), unless the authorization is terminated or revoked sooner. Performed at [facility], [place] [address], [place], [place] [address]  Time coordinating discharge:  36 minutes  SIGNED:   Georgette Shell, MD  [facility] 04/21/2020, 2:11 PM Pager   If  7PM-7AM, please contact night-coverage [email or web address] Password [email or web address]

## 2020-04-21 NOTE — Progress Notes (Signed)
      CT FFR analysis shows possible flow limitation in small distal diagonal branch.  Other areas (including mid RCA which was suspicious on CT) are normal.  Recommend medical management with ASA 81 and Crestor 20mg  PO QD (LDL 120).   Will see back in clinic for follow up.   Rounding team to see.  Candee Furbish, MD

## 2020-04-21 NOTE — Progress Notes (Signed)
04/21/2020 3:46 PM Discharge AVS meds taken today and those due this evening reviewed.  Follow-up appointments and when to call md reviewed.  D/C IV and TELE.  Questions and concerns addressed.   D/C home per orders. [place]

## 2020-04-23 ENCOUNTER — Telehealth: Payer: Self-pay | Admitting: Cardiology

## 2020-04-23 MED FILL — ROSUVASTATIN CALCIUM 20 MG: 20 | 30 days supply | Qty: 30 | Fill #0

## 2020-04-23 NOTE — Telephone Encounter (Signed)
-----   Message from Jerline Pain, MD sent at 04/21/2020  6:48 AM EDT ----- Regarding: Hospital follow up Please set her up with hospital follow up in 2-4 weeks with me or APP  Thanks  Candee Furbish, MD

## 2020-04-23 NOTE — Telephone Encounter (Signed)
LMTCB to schedule hospital follow up.

## 2020-04-24 ENCOUNTER — Telehealth: Payer: Self-pay

## 2020-04-24 ENCOUNTER — Telehealth: Payer: Self-pay | Admitting: Cardiology

## 2020-04-24 NOTE — Telephone Encounter (Signed)
Patient contacted regarding discharge from [facility]ses [facility]  hospital on 04/21/2020.  Patient understands to follow up with provider Tracey Drown, Tracey [name] on 05/04/2020 at 11:15 at [facility]. [address] in [place], [place].  She is requesting that her husband come with her to this hospital f/u. I advised her that due to Covid restrictions he is not allowed to come with her unless there is a medical reason why she cannot come alone. I did recommend that she can ask at her hosp f/u with Tracey [name] if she can call him on her cell phone so he can be a part of the [place].  Patient understands discharge instructions? YES Patient understands medications and regiment? YES Patient understands to bring all medications to this visit? YES  Ask patient:  Are you enrolled in My Chart: yes   The pt states that she has known Tracey [name] for many years and even worked with him at one time. She is requesting that he be her cardiologist. She wants Tracey [name] to know that she thinks "he is a great cardiologist as well"  And that this request is based on her years of knowing Tracey [name].  She is aware that I am forwarding this message to Tracey [name] and Tracey [name] as requested.

## 2020-04-24 NOTE — Telephone Encounter (Signed)
**Note De-identified Rene Gonsoulin Obfuscation** -----  **Note De-Identified Amedeo Detweiler Obfuscation** Message from Abigail Butts, PA-C sent at 04/21/2020  2:58 PM EDT ----- Regarding: TOC visit Please schedule this patient for a follow-up appointment and call them with that information.  Primary Cardiologist: Dr. Marlou Porch Date of Discharge: 04/21/2020 Appointment Needed Within: 1-2 weeks Appointment Type: TOC visit with Dr. Duke Salvia.   Thank you!  Roby Lofts, PA-C

## 2020-04-24 NOTE — Telephone Encounter (Signed)
Per Staff Message Pt has been scheduled for a TOC Visit with Kathyrn Drown 05/04/20 at 11:15 am     ----- Message from Abigail Butts, PA-C sent at 04/21/2020  2:58 PM EDT ----- Regarding: TOC visit Please schedule this patient for a follow-up appointment and call them with that information.  Primary Cardiologist: Dr. Marlou Porch Date of Discharge: 04/21/2020 Appointment Needed Within: 1-2 weeks Appointment Type: TOC visit with Dr. Duke Salvia.   Thank you!  Roby Lofts, PA-C

## 2020-04-25 NOTE — Telephone Encounter (Signed)
Pt requesting to change providers from Dr Marlou Porch to Dr Tamala Julian.

## 2020-04-25 NOTE — Telephone Encounter (Signed)
[place] with me Candee Furbish, MD

## 2020-04-27 MED FILL — METHOCARBAMOL 500 MG TABS: 500 | 30 days supply | Qty: 90 | Fill #1

## 2020-04-28 DIAGNOSIS — R079 Chest pain, unspecified: Secondary | ICD-10-CM | POA: Diagnosis not present

## 2020-04-30 NOTE — Telephone Encounter (Signed)
ok 

## 2020-04-30 NOTE — Progress Notes (Signed)
Cardiology Office Note   Date:  05/04/2020   ID:  Tracey [name], DOB 11-May-1951, MRN [id number]  PCP:  [name], MD  Cardiologist:  Dr. Marlou Porch, MD   No chief complaint on file.   History of Present Illness: Tracey [name] is a 69 y.o. female who presents for hospital follow-up, seen for Dr. Marlou Porch.  [name] has a history of hypertension and osteoporosis who presented to [facility] with left jaw pain for approximately 1 week prior to presentation.  Pain described as constant even at rest.  She had 1 moderate episode with diaphoresis and retrosternal radiation which lasted approximately 25 to 30 minutes and resolved on its own.  Given this she came to the ED for further evaluation.  EKG showed NSR with T wave inversions in leads V1 through V2 otherwise stable when compared to prior tracing  HST found to be 3>>> 6.  D-dimer negative at 0.27.  CXR with no acute cardiopulmonary abnormalities.  Otherwise lab work stable.  She underwent an echocardiogram in 2014 performed for presumed PVCs.   Plan was  for CCTA which was performed 04/21/2020 which showed a small distal diagonal possible flow-limiting lesion per FFR at 0.79 and otherwise normal coronary arteries with medical management recommended.  She was started on ASA as well as Crestor with close follow-up  Today she is here alone and reports no further chest pain episodes since hospital discharge. We discussed CCTYA at length with explanation of her echocardiogram. She has great questions about next steps and plans if symptoms do occur. She reports that she started the Crestor approximately 1 week ago therefore we will set her up for 8-week lipid panel/LFT follow-up appointment and Dr. Tamala Julian thereafter. She denies chest pain, palpitations, LE edema, fatigue, shortness of breath, dizziness, orthopnea or syncope.  Continues to be very active at baseline.  She states that she lives in a 3 story condo and is up and down the stairs all day  long with no anginal symptoms.  Reassured.  Past Medical History:  Diagnosis Date  . Closed hip fracture ([place]) 03/2016   RT HIP  . Hypertension   . Osteopenia   . Osteoporosis    h/o  . Pneumonia   . Skin cancer    multiple skin cancers    Past Surgical History:  Procedure Laterality Date  . CALDWELL LUC    . COLONOSCOPY WITH PROPOFOL N/A 03/22/2014   Procedure: COLONOSCOPY WITH PROPOFOL;  Surgeon: Arta Silence, MD;  Location: [facility];  Service: Endoscopy;  Laterality: N/A;  . HIP PINNING,CANNULATED Right 03/21/2016   Procedure: CANNULATED HIP PINNING;  Surgeon: Rod Can, MD;  Location: [place];  Service: Orthopedics;  Laterality: Right;  . HIP SURGERY    . LUMBAR LAMINECTOMY/DECOMPRESSION MICRODISCECTOMY Left 02/14/2019   Procedure: Laminectomy and Foraminotomy - L5-S1 - left;  Surgeon: Eustace Moore, MD;  Location: [facility];  Service: Neurosurgery;  Laterality: Left;  Laminectomy and Foraminotomy - L5-S1 - left  . NASAL SEPTUM SURGERY     past hx. deviated septum  . TONSILLECTOMY       Current Outpatient Medications  Medication Sig Dispense Refill  . acetaminophen (TYLENOL) 500 MG tablet Take 1,000 mg by mouth every 6 (six) hours as needed (for pain.).    Marland Kitchen amLODipine (NORVASC) 5 MG tablet Take 5 mg by mouth every evening.    Marland Kitchen aspirin EC 81 MG tablet Take 81 mg by mouth every evening.    Marland Kitchen  b complex vitamins tablet Take 1 tablet by mouth daily.    . Biotin 1 MG CAPS Take 1 capsule by mouth daily.     . cholecalciferol (VITAMIN D) 25 MCG (1000 UT) tablet Take 1,000 Units by mouth daily.    Marland Kitchen estradiol (ESTRACE VAGINAL) 0.1 MG/GM vaginal cream Insert  0.5 Applicatorfuls vaginally 2 (two) times a week. Sundays & Wednesdays 42.5 g 4  . fish oil-omega-3 fatty acids 1000 MG capsule Take 1,000 mg by mouth 2 (two) times daily.     . folic acid (FOLVITE) 1 MG tablet Take 1 mg by mouth daily.      Marland Kitchen gabapentin (NEURONTIN) 400 MG capsule Take 400 mg by mouth at bedtime.    .  methocarbamol (ROBAXIN) 500 MG tablet Take 1 tablet (500 mg total) by mouth every 8 (eight) hours as needed for muscle spasms. 90 tablet 1  . metroNIDAZOLE (METROGEL) 1 % gel Apply 1 application topically 2 (two) times daily. Applied to face    . Multiple Vitamin (MULTIVITAMIN WITH MINERALS) TABS tablet Take 1 tablet by mouth daily.    . nabumetone (RELAFEN) 500 MG tablet Take 1 tablet (500 mg total) by mouth 2 (two) times daily as needed. 40 tablet 1  . Polyethyl Glycol-Propyl Glycol (SYSTANE OP) Place 1 drop into both eyes 2 (two) times daily as needed (Dry/Irritated eyes).    . psyllium (METAMUCIL) 58.6 % packet Take 1 packet by mouth daily.    . rosuvastatin (CRESTOR) 20 MG tablet Take 1 tablet (20 mg total) by mouth daily. 30 tablet 11  . Zoledronic Acid (RECLAST IV) Inject 1 Dose into the vein once. Once a year     No current facility-administered medications for this visit.    Allergies:   Adhesive [tape], Cefaclor, Fosamax [alendronate sodium], and Neomycin-bacitracin zn-polymyx    Social History:  The patient  reports that she has never smoked. She has never used smokeless tobacco. She reports previous alcohol use of about 0.3 standard drinks of alcohol per week. She reports that she does not use drugs.   Family History:  The patient's family history includes Breast cancer in her maternal aunt and paternal aunt; Cancer in her maternal grandfather; Diabetes in her brother; Heart disease in her paternal grandfather; Hyperlipidemia in her maternal grandmother; Hypertension in her brother, father, and mother; Leukemia in her father; Stroke in her paternal grandfather.    ROS:  Please see the history of present illness.   Otherwise, review of systems are positive for none. All other systems are reviewed and negative.    PHYSICAL EXAM: VS:  BP 110/64   Pulse 82   Ht 5\' 6"  (1.676 m)   Wt 103 lb 12.8 oz (47.1 kg)   SpO2 100%   BMI 16.75 kg/m  , BMI Body mass index is 16.75 kg/m.    General: Well developed, well nourished, NAD Neck: Negative for carotid bruits. No JVD Lungs: Breathing is unlabored. Cardiovascular: RRR with S1 S2.  Extremities: No edema.  Neuro: Alert and oriented. No focal deficits. No facial asymmetry. MAE spontaneously. Psych: Responds to questions appropriately with normal affect.     EKG:  EKG is not ordered today.   Recent Labs: 04/19/2020: BUN 18; Potassium 3.8; Sodium 141 04/20/2020: ALT 21; Creatinine, Ser 0.94; Hemoglobin 13.8; Platelets 201    Lipid Panel    Component Value Date/Time   CHOL 228 (H) 04/20/2020 0650   TRIG 55 04/20/2020 0650   HDL 97 04/20/2020 0650  CHOLHDL 2.4 04/20/2020 0650   VLDL 11 04/20/2020 0650   LDLCALC 120 (H) 04/20/2020 0650    Wt Readings from Last 3 Encounters:  05/04/20 103 lb 12.8 oz (47.1 kg)  04/19/20 102 lb (46.3 kg)  03/21/20 110 lb (49.9 kg)    Other studies Reviewed: Additional studies/ records that were reviewed today include:   CCTA 04/21/2020:  IMPRESSION: 1. Distal diagonal FFR 0.79 (mild possible flow limitation) in fairly small caliber vessel. Otherwise, normal FFR in all other coronary territory. Continue with medical management, secondary risk factor modification.   Echocardiogram 04/20/2020:   1. Left ventricular ejection fraction, by estimation, is 60 to 65%. The  left ventricle has normal function. The left ventricle has no regional  wall motion abnormalities. There is moderate left ventricular hypertrophy.  Left ventricular diastolic  parameters are consistent with Grade II diastolic dysfunction  (pseudonormalization).  2. Right ventricular systolic function is normal. The right ventricular  size is normal.  3. The mitral valve is normal in structure. No evidence of mitral valve  regurgitation. No evidence of mitral stenosis.  4. The aortic valve has an indeterminant number of cusps. Aortic valve  regurgitation is not visualized. No aortic stenosis is  present.   ASSESSMENT AND PLAN:  1.  CAD with no anginal symptoms:  -CCTA performed which showed FFR of the distal diagonal vessel at 0.79, possibly flow-limiting however medical management was recommended -Echo with normal EF, no regional wall motion abnormalities and G2 DD -No recurrent symptoms -Echo and CCTA discussed at length with all questions answered -Plan for follow up Lipid and LFTS -Continue ASA, statin   2.  Chronic diastolic dysfunction: -Echocardiogram from 04/20/2020 with normal EF and G2 DD -Appears euvolemic on exam -Continue current regimen  3.  HTN: -Stable, 110/64 -Continue current regimen   4.  HLD: -LDL, 122 -Started on Crestor during hospitalization -Repeat lab work in 6 to 8 weeks   Current medicines are reviewed at length with the patient today.  The patient does not have concerns regarding medicines.  The following changes have been made:  no change  Labs/ tests ordered today include: Lipid/LFTS No orders of the defined types were placed in this encounter.   Disposition:   FU with Dr. Tamala Julian  in 3 months  Signed, Kathyrn Drown, NP  05/04/2020 11:53 AM    [address], [place], [place]  [address] Phone: [phone]; Fax: [phone]

## 2020-05-01 NOTE — Telephone Encounter (Signed)
Pt has appt 6/4 with Kathyrn Drown, NP for TOC.  Will set up with Dr. Tamala Julian for next OV.

## 2020-05-03 DIAGNOSIS — R636 Underweight: Secondary | ICD-10-CM | POA: Diagnosis not present

## 2020-05-03 DIAGNOSIS — M810 Age-related osteoporosis without current pathological fracture: Secondary | ICD-10-CM | POA: Diagnosis not present

## 2020-05-03 DIAGNOSIS — Z0000 Encounter for general adult medical examination without abnormal findings: Secondary | ICD-10-CM | POA: Diagnosis not present

## 2020-05-03 DIAGNOSIS — E7800 Pure hypercholesterolemia, unspecified: Secondary | ICD-10-CM | POA: Diagnosis not present

## 2020-05-03 DIAGNOSIS — I10 Essential (primary) hypertension: Secondary | ICD-10-CM | POA: Diagnosis not present

## 2020-05-03 DIAGNOSIS — Z1389 Encounter for screening for other disorder: Secondary | ICD-10-CM | POA: Diagnosis not present

## 2020-05-04 ENCOUNTER — Ambulatory Visit: Payer: PPO | Admitting: Cardiology

## 2020-05-04 ENCOUNTER — Other Ambulatory Visit: Payer: Self-pay

## 2020-05-04 ENCOUNTER — Encounter: Payer: Self-pay | Admitting: Cardiology

## 2020-05-04 ENCOUNTER — Other Ambulatory Visit: Payer: Self-pay | Admitting: Internal Medicine

## 2020-05-04 VITALS — BP 110/64 | HR 82 | Ht 66.0 in | Wt 103.8 lb

## 2020-05-04 DIAGNOSIS — I10 Essential (primary) hypertension: Secondary | ICD-10-CM | POA: Diagnosis not present

## 2020-05-04 DIAGNOSIS — I2510 Atherosclerotic heart disease of native coronary artery without angina pectoris: Secondary | ICD-10-CM

## 2020-05-04 DIAGNOSIS — M810 Age-related osteoporosis without current pathological fracture: Secondary | ICD-10-CM

## 2020-05-04 NOTE — Patient Instructions (Addendum)
Medication Instructions:   Your physician recommends that you continue on your current medications as directed. Please refer to the Current Medication list given to you today.  *If you need a refill on your cardiac medications before your next appointment, please call your pharmacy*  Lab Work:  Your physician recommends that you return for lab work in 8 weeks  If you have labs (blood work) drawn today and your tests are completely normal, you will receive your results only by: Marland Kitchen MyChart Message (if you have MyChart) OR . A paper copy in the mail If you have any lab test that is abnormal or we need to change your treatment, we will call you to review the results.  Testing/Procedures:  None ordered today  Follow-Up:  On 08/09/20 at 11:20AM with Daneen Schick, MD

## 2020-05-04 NOTE — Addendum Note (Signed)
Addended by: Gwyndolyn Saxon R on: 05/04/2020 12:05 PM   Modules accepted: Orders

## 2020-05-09 ENCOUNTER — Other Ambulatory Visit: Payer: Self-pay

## 2020-05-09 ENCOUNTER — Encounter: Payer: Self-pay | Admitting: Family Medicine

## 2020-05-09 ENCOUNTER — Ambulatory Visit: Payer: PPO | Admitting: Family Medicine

## 2020-05-09 VITALS — BP 140/80 | HR 79 | Ht 66.0 in | Wt 109.0 lb

## 2020-05-09 DIAGNOSIS — M5416 Radiculopathy, lumbar region: Secondary | ICD-10-CM | POA: Diagnosis not present

## 2020-05-09 DIAGNOSIS — M810 Age-related osteoporosis without current pathological fracture: Secondary | ICD-10-CM

## 2020-05-09 DIAGNOSIS — M999 Biomechanical lesion, unspecified: Secondary | ICD-10-CM

## 2020-05-09 NOTE — Patient Instructions (Signed)
CoQ10 200mg  with the new statin See me again in 6-8 weeks

## 2020-05-09 NOTE — Assessment & Plan Note (Signed)
Difficult overall.  Patient has had the infarction, as well as a lumbar radiculopathy, gluteal and hamstring tendinitis that all contributes.  Chronic problem with mild exacerbation.  Has been taking the gabapentin fairly regularly.  We discussed continuing that.  Has responded decently to manipulation in the past and we can continue this as well.  Discussed icing regimen and home exercises.  Increase activity slowly.  Follow-up again in 4 to 8 weeks

## 2020-05-09 NOTE — Assessment & Plan Note (Signed)
Continue same regimen.  We will need to monitor the patient's symptoms.

## 2020-05-09 NOTE — Progress Notes (Signed)
[place] [address] [place] Phone: [phone] Subjective:   I Tracey [name] am serving as a Education administrator for Dr. Hulan Saas.  This visit occurred during the SARS-CoV-2 public health emergency.  Safety protocols were in place, including screening questions prior to the visit, additional usage of staff PPE, and extensive cleaning of exam room while observing appropriate contact time as indicated for disinfecting solutions.   I'm seeing this patient by the request  of:  [name], MD  CC: Low back pain follow-up  [id number]  Tracey [name] is a 69 y.o. female coming in with complaint of back pain. Last seen for OMT on 03/21/2020. Patient states she is doing well today. Since we have seen patient patient did have to go to the emergency room for almost a chest wall discomfort. Work-up including echocardiogram laboratory work-up and CT were unremarkable. Patient does remain fairly active at the moment. Starting to ride her bike again on a more regular basis. Still has some of the buttocks and back pain. Never without some discomfort but nothing that is stopping her from activities at the moment awaken her up. No side effects to the medications     Past Medical History:  Diagnosis Date   Closed hip fracture ([place]) 03/2016   RT HIP   Hypertension    Osteopenia    Osteoporosis    h/o   Pneumonia    Skin cancer    multiple skin cancers   Past Surgical History:  Procedure Laterality Date   CALDWELL LUC     COLONOSCOPY WITH PROPOFOL N/A 03/22/2014   Procedure: COLONOSCOPY WITH PROPOFOL;  Surgeon: Arta Silence, MD;  [facility];  Service: Endoscopy;  Laterality: N/A;   HIP PINNING,CANNULATED Right 03/21/2016   Procedure: CANNULATED HIP PINNING;  Surgeon: Rod Can, MD;  Location: [facility];  Service: Orthopedics;  Laterality: Right;   HIP SURGERY     LUMBAR LAMINECTOMY/DECOMPRESSION MICRODISCECTOMY Left  02/14/2019   Procedure: Laminectomy and Foraminotomy - L5-S1 - left;  Surgeon: Eustace Moore, MD;  Location: [facility];  Service: Neurosurgery;  Laterality: Left;  Laminectomy and Foraminotomy - L5-S1 - left   NASAL SEPTUM SURGERY     past hx. deviated septum   TONSILLECTOMY     Social History   Socioeconomic History   Marital status: Married    Spouse name: [name]   Number of children: 1   Years of education: Not on file   Highest education level: Not on file  Occupational History   Occupation: MED TECH    Employer: [employer]  Tobacco Use   Smoking status: Never Smoker   Smokeless tobacco: Never Used  Substance and Sexual Activity   Alcohol use: Not Currently    Alcohol/week: 0.3 standard drinks   Drug use: No   Sexual activity: Yes    Partners: Male    Comment: married, [place]  Other Topics Concern   Not on file  Social History Narrative   Not on file   Social Determinants of Health   Financial Resource Strain:    Difficulty of Paying Living Expenses:   Food Insecurity:    Worried About Charity fundraiser in the Last Year:    Arboriculturist in the Last Year:   Transportation Needs:    Film/video editor (Medical):    Lack of Transportation (Non-Medical):   Physical Activity:    Days of Exercise per Week:  Minutes of Exercise per Session:   Stress:    Feeling of Stress :   Social Connections:    Frequency of Communication with Friends and Family:    Frequency of Social Gatherings with Friends and Family:    Attends Religious Services:    Active Member of Clubs or Organizations:    Attends Music therapist:    Marital Status:    Allergies  Allergen Reactions   Adhesive [Tape]    Cefaclor Hives   Fosamax [Alendronate Sodium] Other (See Comments)    Gi upset   Neomycin-Bacitracin Zn-Polymyx Rash   Family History  Problem Relation Age of Onset   Hypertension Mother    Leukemia Father     Hypertension Father    Diabetes Brother    Hypertension Brother    Hyperlipidemia Maternal Grandmother    Cancer Maternal Grandfather    Heart disease Paternal Grandfather    Stroke Paternal Grandfather    Breast cancer Maternal Aunt    Breast cancer Paternal Aunt     Current Outpatient Medications (Endocrine & Metabolic):    Zoledronic Acid (RECLAST IV), Inject 1 Dose into the vein once. Once a year  Current Outpatient Medications (Cardiovascular):    amLODipine (NORVASC) 5 MG tablet, Take 5 mg by mouth every evening.   rosuvastatin (CRESTOR) 20 MG tablet, Take 1 tablet (20 mg total) by mouth daily.   Current Outpatient Medications (Analgesics):    acetaminophen (TYLENOL) 500 MG tablet, Take 1,000 mg by mouth every 6 (six) hours as needed (for pain.).   aspirin EC 81 MG tablet, Take 81 mg by mouth every evening.   nabumetone (RELAFEN) 500 MG tablet, Take 1 tablet (500 mg total) by mouth 2 (two) times daily as needed.  Current Outpatient Medications (Hematological):    folic acid (FOLVITE) 1 MG tablet, Take 1 mg by mouth daily.    Current Outpatient Medications (Other):    b complex vitamins tablet, Take 1 tablet by mouth daily.   Biotin 1 MG CAPS, Take 1 capsule by mouth daily.    cholecalciferol (VITAMIN D) 25 MCG (1000 UT) tablet, Take 1,000 Units by mouth daily.   estradiol (ESTRACE VAGINAL) 0.1 MG/GM vaginal cream, Insert  0.5 Applicatorfuls vaginally 2 (two) times a week. Sundays & Wednesdays   fish oil-omega-3 fatty acids 1000 MG capsule, Take 1,000 mg by mouth 2 (two) times daily.    gabapentin (NEURONTIN) 400 MG capsule, Take 400 mg by mouth at bedtime.   methocarbamol (ROBAXIN) 500 MG tablet, Take 1 tablet (500 mg total) by mouth every 8 (eight) hours as needed for muscle spasms.   metroNIDAZOLE (METROGEL) 1 % gel, Apply 1 application topically 2 (two) times daily. Applied to face   Multiple Vitamin (MULTIVITAMIN WITH MINERALS) TABS tablet, Take 1  tablet by mouth daily.   Polyethyl Glycol-Propyl Glycol (SYSTANE OP), Place 1 drop into both eyes 2 (two) times daily as needed (Dry/Irritated eyes).   psyllium (METAMUCIL) 58.6 % packet, Take 1 packet by mouth daily.   Reviewed prior external information including notes and imaging from  primary care provider As well as notes that were available from care everywhere and other healthcare systems.  Past medical history, social, surgical and family history all reviewed in electronic medical record.  No pertanent information unless stated regarding to the chief complaint.   Review of Systems:  No headache, visual changes, nausea, vomiting, diarrhea, constipation, dizziness, abdominal pain, skin rash, fevers, chills, night sweats, weight loss, swollen lymph nodes,  body aches, joint swelling, chest pain, shortness of breath, mood changes. POSITIVE muscle aches  Objective  Blood pressure 140/80, pulse 79, height 5\' 6"  (1.676 m), weight 109 lb (49.4 kg), SpO2 98 %.   General: No apparent distress alert and oriented x3 mood and affect normal, dressed appropriately.  HEENT: Pupils equal, extraocular movements intact  Respiratory: Patient's speak in full sentences and does not appear short of breath  Cardiovascular: No lower extremity edema, non tender, no erythema  Neuro: Cranial nerves II through XII are intact, neurovascularly intact in all extremities with 2+ DTRs and 2+ pulses.  Gait mild antalgic favoring the left hip MSK: Left hip exam still show some tenderness more in the gluteal area but also having tenderness over the paraspinal musculature of the lumbar spine left greater than right. Patient does have some tightness in the parascapular region as well. Negative straight leg test, positive Corky Sox on the left.  Osteopathic findings C2 flexed rotated and side bent right C6 flexed rotated and side bent left T4 extended rotated and side bent right inhaled rib T9 extended rotated and side  bent left L2 flexed rotated and side bent left Sacrum left on left     Impression and Recommendations:     The above documentation has been reviewed and is accurate and complete [name], DO       Note: This dictation was prepared with Dragon dictation along with smaller phrase technology. Any transcriptional errors that result from this process are unintentional.

## 2020-05-18 MED FILL — ROSUVASTATIN CALCIUM 20 MG: 20 | 30 days supply | Qty: 30 | Fill #0

## 2020-06-11 DIAGNOSIS — E7800 Pure hypercholesterolemia, unspecified: Secondary | ICD-10-CM | POA: Diagnosis not present

## 2020-06-25 MED FILL — ROSUVASTATIN CALCIUM 20 MG: 20 | 30 days supply | Qty: 30 | Fill #1

## 2020-06-27 ENCOUNTER — Encounter: Payer: Self-pay | Admitting: Family Medicine

## 2020-06-27 ENCOUNTER — Ambulatory Visit: Payer: PPO | Admitting: Family Medicine

## 2020-06-27 ENCOUNTER — Other Ambulatory Visit: Payer: Self-pay

## 2020-06-27 ENCOUNTER — Other Ambulatory Visit: Payer: PPO | Admitting: *Deleted

## 2020-06-27 VITALS — BP 138/80 | HR 83 | Ht 66.0 in | Wt 102.0 lb

## 2020-06-27 DIAGNOSIS — I2510 Atherosclerotic heart disease of native coronary artery without angina pectoris: Secondary | ICD-10-CM

## 2020-06-27 DIAGNOSIS — M5416 Radiculopathy, lumbar region: Secondary | ICD-10-CM | POA: Diagnosis not present

## 2020-06-27 DIAGNOSIS — M999 Biomechanical lesion, unspecified: Secondary | ICD-10-CM

## 2020-06-27 DIAGNOSIS — I10 Essential (primary) hypertension: Secondary | ICD-10-CM

## 2020-06-27 LAB — HEPATIC FUNCTION PANEL
ALT: 18 IU/L (ref 0–32)
AST: 25 IU/L (ref 0–40)
Albumin: 4.5 g/dL (ref 3.7–4.7)
Alkaline Phosphatase: 62 IU/L (ref 48–121)
Bilirubin Total: 0.6 mg/dL (ref 0.0–1.2)
Bilirubin, Direct: 0.17 mg/dL (ref 0.00–0.40)
Total Protein: 6.9 g/dL (ref 6.0–8.5)

## 2020-06-27 LAB — LIPID PANEL
Chol/HDL Ratio: 1.8 ratio (ref 0.0–4.4)
Cholesterol, Total: 169 mg/dL (ref 100–199)
HDL: 94 mg/dL (ref >39)
LDL Chol Calc (NIH): 59 mg/dL (ref 0–99)
Triglycerides: 92 mg/dL (ref 0–149)
VLDL Cholesterol Cal: 16 mg/dL (ref 5–40)

## 2020-06-27 NOTE — Patient Instructions (Addendum)
See me in 5-6 weeks 

## 2020-06-27 NOTE — Assessment & Plan Note (Signed)

## 2020-06-27 NOTE — Progress Notes (Signed)
Tracey [name] Phone: [phone] Subjective:   Tracey [name], am serving as a scribe for Dr. Hulan Saas. This visit occurred during the SARS-CoV-2 public health emergency.  Safety protocols were in place, including screening questions prior to the visit, additional usage of staff PPE, and extensive cleaning of exam room while observing appropriate contact time as indicated for disinfecting solutions.   I'm seeing this patient by the request  of:  Tracey Carol, MD  CC: Back pain and neck pain follow-up  [id number]  Tracey [name] is a 69 y.o. female coming in with complaint of back and neck pain. OMT 05/09/2020. Patient states that she has been riding her bike and her neck is tight.   Medications patient has been prescribed: Robaxin and gabapentin  Taking: Yes         Reviewed prior external information including notes and imaging from previsou exam, outside providers and external EMR if available.   As well as notes that were available from care everywhere and other healthcare systems.  Past medical history, social, surgical and family history all reviewed in electronic medical record.  [name] pertanent information unless stated regarding to the chief complaint.   Past Medical History:  Diagnosis Date  . Closed hip fracture ([place]) 03/2016   RT HIP  . Hypertension   . Osteopenia   . Osteoporosis    h/o  . Pneumonia   . Skin cancer    multiple skin cancers    Allergies  Allergen Reactions  . Adhesive [Tape]   . Cefaclor Hives  . Fosamax [Alendronate Sodium] Other (See Comments)    Gi upset  . Neomycin-Bacitracin Zn-Polymyx Rash     Review of Systems:  [name] headache, visual changes, nausea, vomiting, diarrhea, constipation, dizziness, abdominal pain, skin rash, fevers, chills, night sweats, weight loss, swollen lymph nodes, body aches, joint swelling, chest pain, shortness of breath, mood  changes. POSITIVE muscle aches  Objective  Blood pressure (!) 138/80, pulse 83, height 5\' 6"  (1.676 m), weight 102 lb (46.3 kg), SpO2 99 %.   General: [name] apparent distress alert and oriented x3 mood and affect normal, dressed appropriately.  HEENT: Pupils equal, extraocular movements intact  Respiratory: Patient's speak in full sentences and does not appear short of breath  Cardiovascular: [name] lower extremity edema, non tender, [name] erythema  Neuro: Cranial nerves II through XII are intact, neurovascularly intact in all extremities with 2+ DTRs and 2+ pulses.  Gait normal with good balance and coordination.  MSK:  Non tender with full range of motion and good stability and symmetric strength and tone of shoulders, elbows, wrist, hip, knee and ankles bilaterally.  Back -mild arthritic changes of multiple joints.  Loss of lordosis.  Tightness with Corky Sox.  Osteopathic findings  C2 flexed rotated and side bent right C7 flexed rotated and side bent left T3 extended rotated and side bent right inhaled rib L2 flexed rotated and side bent right Sacrum right on right     Assessment and Plan:   Lumbar radiculopathy Stable overall.  Patient has been responding fairly well to osteopathic manipulation.  Discussed the gabapentin and the Robaxin.  [name] change in medication.  Follow-up again in 6 weeks  Nonallopathic lesion of cervical region   Decision today to treat with OMT was based on Physical Exam  After verbal consent patient was treated with HVLA, ME, FPR techniques in cervical, thoracic, rib, lumbar and sacral areas, all areas  are chronic   Patient tolerated the procedure well with improvement in symptoms  Patient given exercises, stretches and lifestyle modifications  See medications in patient instructions if given  Patient will follow up in 4-8 weeks        The above documentation has been reviewed and is accurate and complete Tracey Pulley, DO       Note: This  dictation was prepared with Dragon dictation along with smaller phrase technology. Any transcriptional errors that result from this process are unintentional.

## 2020-06-27 NOTE — Assessment & Plan Note (Signed)
Stable overall.  Patient has been responding fairly well to osteopathic manipulation.  Discussed the gabapentin and the Robaxin.  No change in medication.  Follow-up again in 6 weeks

## 2020-06-29 ENCOUNTER — Other Ambulatory Visit: Payer: PPO

## 2020-07-12 MED FILL — FOLIC ACID 1 MG TABS: 1 | 90 days supply | Qty: 90 | Fill #3

## 2020-07-12 MED FILL — AMLODIPINE BESYLATE 5 MG TA: 5 | 90 days supply | Qty: 90 | Fill #3

## 2020-07-23 ENCOUNTER — Ambulatory Visit
Admission: RE | Admit: 2020-07-23 | Discharge: 2020-07-23 | Disposition: A | Discharge status: Home / Self Care | Payer: PPO | Source: Ambulatory Visit | Attending: Internal Medicine | Admitting: Internal Medicine

## 2020-07-23 ENCOUNTER — Other Ambulatory Visit: Payer: Self-pay

## 2020-07-23 DIAGNOSIS — M810 Age-related osteoporosis without current pathological fracture: Secondary | ICD-10-CM | POA: Diagnosis not present

## 2020-07-23 DIAGNOSIS — M85852 Other specified disorders of bone density and structure, left thigh: Secondary | ICD-10-CM | POA: Diagnosis not present

## 2020-07-23 DIAGNOSIS — Z780 Asymptomatic menopausal state: Secondary | ICD-10-CM | POA: Diagnosis not present

## 2020-07-25 MED FILL — ROSUVASTATIN CALCIUM 20 MG: 20 | 30 days supply | Qty: 30 | Fill #2

## 2020-08-06 NOTE — Progress Notes (Signed)
Cardiology Office Note:    Date:  08/09/2020   ID:  Tracey [name], DOB [birth year], MRN [id number]  PCP:  Tracey Carol, MD  Cardiologist:  Tracey Grooms, MD   Referring MD: Tracey Carol, MD   Chief Complaint  Patient presents with  . Coronary Artery Disease    History of Present Illness:    Tracey [name] is a 69 y.o. female with a hx of  CAD, hypertension, hyperlipidemia, and CAD treated with medical therapy.  In May, Tracey [name] started experiencing bilateral jaw discomfort and on one occasion it was associated with diaphoresis. This ultimately led to cardiac evaluation. She ended up hospitalized at [facility] after driving back from [facility]. Cardiac assessment was negative for acute MI. She did ultimately undergo coronary CT angiography and echocardiography. Somewhat surprisingly, she was found to have a very high coronary calcium score and moderate LVH on echocardiography.  Medications were adjusted, she has not had significant recurrence of the combined complex of jaw discomfort and diaphoresis. She occasionally has a jaw discomfort that is random and [name] last for hours. She is here today for further discussion of her abnormal findings and considerations.  The Heart Flow CT FFR images were were pulled up on my iPhone and we discussed the findings. No significant reduction in FFR was noted in any vascular segment. She had clear luminal irregularities to go along with a calcium score greater than 700. We discussed the management of elevated calcium score/nonobstructive coronary artery disease from the standpoint of primary prevention.  Past Medical History:  Diagnosis Date  . Closed hip fracture ([place]) 03/2016   RT HIP  . Hypertension   . Osteopenia   . Osteoporosis    h/o  . Pneumonia   . Skin cancer    multiple skin cancers    Past Surgical History:  Procedure Laterality Date  . CALDWELL LUC    . COLONOSCOPY WITH PROPOFOL N/A 03/22/2014   Procedure:  COLONOSCOPY WITH PROPOFOL;  Surgeon: Tracey Silence, MD;  Location: [facility];  Service: Endoscopy;  Laterality: N/A;  . HIP PINNING,CANNULATED Right 03/21/2016   Procedure: CANNULATED HIP PINNING;  Surgeon: Tracey Can, MD;  Location: [place];  Service: Orthopedics;  Laterality: Right;  . HIP SURGERY    . LUMBAR LAMINECTOMY/DECOMPRESSION MICRODISCECTOMY Left 02/14/2019   Procedure: Laminectomy and Foraminotomy - L5-S1 - left;  Surgeon: Tracey Moore, MD;  Location: [place];  Service: Neurosurgery;  Laterality: Left;  Laminectomy and Foraminotomy - L5-S1 - left  . NASAL SEPTUM SURGERY     past hx. deviated septum  . TONSILLECTOMY      Current Medications: Current Meds  Medication Sig  . acetaminophen (TYLENOL) 500 MG tablet Take 1,000 mg by mouth every 6 (six) hours as needed (for pain.).  Marland Kitchen amLODipine (NORVASC) 5 MG tablet Take 5 mg by mouth every evening.  Marland Kitchen aspirin EC 81 MG tablet Take 81 mg by mouth every evening.  Marland Kitchen b complex vitamins tablet Take 1 tablet by mouth daily.  . Biotin 1 MG CAPS Take 1 capsule by mouth daily.   . cholecalciferol (VITAMIN D) 25 MCG (1000 UT) tablet Take 1,000 Units by mouth daily.  Marland Kitchen estradiol (ESTRACE VAGINAL) 0.1 MG/GM vaginal cream Insert  0.5 Applicatorfuls vaginally 2 (two) times a week. Sundays & Wednesdays  . fish oil-omega-3 fatty acids 1000 MG capsule Take 1,000 mg by mouth 2 (two) times daily.   . folic acid (FOLVITE) 1 MG tablet Take 1  mg by mouth daily.    Marland Kitchen gabapentin (NEURONTIN) 400 MG capsule Take 400 mg by mouth at bedtime.  . methocarbamol (ROBAXIN) 500 MG tablet Take 1 tablet (500 mg total) by mouth every 8 (eight) hours as needed for muscle spasms.  . metroNIDAZOLE (METROGEL) 1 % gel Apply 1 application topically 2 (two) times daily. Applied to face  . Multiple Vitamin (MULTIVITAMIN WITH MINERALS) TABS tablet Take 1 tablet by mouth daily.  . nabumetone (RELAFEN) 500 MG tablet Take 1 tablet (500 mg total) by mouth 2 (two) times daily as  needed.  Vladimir Faster Glycol-Propyl Glycol (SYSTANE OP) Place 1 drop into both eyes 2 (two) times daily as needed (Dry/Irritated eyes).  . psyllium (METAMUCIL) 58.6 % packet Take 1 packet by mouth daily.  . rosuvastatin (CRESTOR) 20 MG tablet Take 1 tablet (20 mg total) by mouth daily.  . Zoledronic Acid (RECLAST IV) Inject 1 Dose into the vein once. Once a year  . [DISCONTINUED] rosuvastatin (CRESTOR) 20 MG tablet Take 1 tablet (20 mg total) by mouth daily.  . [DISCONTINUED] rosuvastatin (CRESTOR) 20 MG tablet Take 1 tablet (20 mg total) by mouth daily.     Allergies:   Adhesive [tape], Cefaclor, Fosamax [alendronate sodium], and Neomycin-bacitracin zn-polymyx   Social History   Socioeconomic History  . Marital status: Married    Spouse name: Tracey [name]  . Number of children: 1  . Years of education: Not on file  . Highest education level: Not on file  Occupational History  . Occupation: MED TECH    Employer: [employer]  Tobacco Use  . Smoking status: Never Smoker  . Smokeless tobacco: Never Used  Vaping Use  . Vaping Use: Never used  Substance and Sexual Activity  . Alcohol use: Not Currently    Alcohol/week: 0.3 standard drinks  . Drug use: No  . Sexual activity: Yes    Partners: Male    Comment: married, [place]  Other Topics Concern  . Not on file  Social History Narrative  . Not on file   Social Determinants of Health   Financial Resource Strain:   . Difficulty of Paying Living Expenses: Not on file  Food Insecurity:   . Worried About Charity fundraiser in the Last Year: Not on file  . Ran Out of Food in the Last Year: Not on file  Transportation Needs:   . Lack of Transportation (Medical): Not on file  . Lack of Transportation (Non-Medical): Not on file  Physical Activity:   . Days of Exercise per Week: Not on file  . Minutes of Exercise per Session: Not on file  Stress:   . Feeling of Stress : Not on file  Social Connections:   . Frequency of  Communication with Friends and Family: Not on file  . Frequency of Social Gatherings with Friends and Family: Not on file  . Attends Religious Services: Not on file  . Active Member of Clubs or Organizations: Not on file  . Attends Archivist Meetings: Not on file  . Marital Status: Not on file     Family History: The patient's family history includes Breast cancer in her maternal aunt and paternal aunt; Cancer in her maternal grandfather; Diabetes in her brother; Heart disease in her paternal grandfather; Hyperlipidemia in her maternal grandmother; Hypertension in her brother, father, and mother; Leukemia in her father; Stroke in her paternal grandfather.  ROS:   Please see the history of present illness.  Troubled by her back and still having paresthesias in her left leg following lumbar laminectomy 1 year ago. Not exercising as much as she did prior to the pandemic. All other systems reviewed and are negative.  EKGs/Labs/Other Studies Reviewed:    The following studies were reviewed today:  COR CTA 03/2020: IMPRESSION: 1. Coronary calcium score of 704. This was 95 percentile for age and sex matched control.  2. Normal coronary origin with right dominance.  3. Non flow limiting LAD calcified plaque (0-24%), Moderate calcified plaque in circumflex distribution at bifurcation of OM and AV groove circumflex (25-49%) and scattered calcified lesions in proximal and mid RCA up to 50-74% which may be flow limiting.  CT FFR analysis. IMPRESSION: 1. Distal diagonal FFR 0.79 (mild possible flow limitation) in fairly small caliber vessel. Otherwise, normal FFR in all other coronary territory. Continue with medical management, secondary risk factor modification.  Candee Furbish, MD [facility]   2D Doppler echocardiogram performed Apr 20, 2020: IMPRESSIONS    1. Left ventricular ejection fraction, by estimation, is 60 to 65%. The  left ventricle has normal function. The left  ventricle has no regional  wall motion abnormalities. There is moderate left ventricular hypertrophy.  Left ventricular diastolic  parameters are consistent with Grade II diastolic dysfunction  (pseudonormalization).  2. Right ventricular systolic function is normal. The right ventricular  size is normal.  3. The mitral valve is normal in structure. No evidence of mitral valve  regurgitation. No evidence of mitral stenosis.  4. The aortic valve has an indeterminant number of cusps. Aortic valve  regurgitation is not visualized. No aortic stenosis is present.   EKG:  EKG the electrocardiogram from Apr 23, 2020  Recent Labs: 04/19/2020: BUN 18; Potassium 3.8; Sodium 141 04/20/2020: Creatinine, Ser 0.94; Hemoglobin 13.8; Platelets 201 06/27/2020: ALT 18  Recent Lipid Panel    Component Value Date/Time   CHOL 169 06/27/2020 1106   TRIG 92 06/27/2020 1106   HDL 94 06/27/2020 1106   CHOLHDL 1.8 06/27/2020 1106   CHOLHDL 2.4 04/20/2020 0650   VLDL 11 04/20/2020 0650   LDLCALC 59 06/27/2020 1106    Physical Exam:    VS:  BP 124/82   Pulse 77   Ht 5\' 6"  (1.676 m)   Wt 103 lb 12.8 oz (47.1 kg)   SpO2 95%   BMI 16.75 kg/m     Wt Readings from Last 3 Encounters:  08/09/20 103 lb 12.8 oz (47.1 kg)  06/27/20 102 lb (46.3 kg)  05/09/20 109 lb (49.4 kg)     GEN: Slender. No acute distress HEENT: Normal NECK: No JVD. LYMPHATICS: No lymphadenopathy CARDIAC:  RRR without murmur, gallop, or edema. VASCULAR:  Normal Pulses. No bruits. RESPIRATORY:  Clear to auscultation without rales, wheezing or rhonchi  ABDOMEN: Soft, non-tender, non-distended, No pulsatile mass, MUSCULOSKELETAL: No deformity  SKIN: Warm and dry NEUROLOGIC:  Alert and oriented x 3 PSYCHIATRIC:  Normal affect   ASSESSMENT:    1. Coronary artery disease involving native coronary artery of native heart without angina pectoris   2. Essential hypertension   3. Hyperlipidemia LDL goal <70   4. Educated about  COVID-19 virus infection    PLAN:    In order of problems listed above:  1. Primary prevention discussed.  Increasing aerobic activity encouraged. 2. With LVH blood pressure control should be tight with target less than 130/80 mmHg.  May need to add low-dose beta-blocker therapy or ARB. 3. Continue rosuvastatin 20 mg/day. 4. She  has been vaccinated and is doing well from standpoint of Covid avoidance.  Overall education and awareness concerning primary risk prevention was discussed in detail: LDL less than 70, hemoglobin A1c less than 7, blood pressure target less than 130/80 mmHg, >150 minutes of moderate aerobic activity per week, avoidance of smoking, weight control (via diet and exercise), and continued surveillance/management of/for obstructive sleep apnea.    Medication Adjustments/Labs and Tests Ordered: Current medicines are reviewed at length with the patient today.  Concerns regarding medicines are outlined above.  No orders of the defined types were placed in this encounter.  Meds ordered this encounter  Medications  . DISCONTD: rosuvastatin (CRESTOR) 20 MG tablet    Sig: Take 1 tablet (20 mg total) by mouth daily.    Dispense:  30 tablet    Refill:  11  . rosuvastatin (CRESTOR) 20 MG tablet    Sig: Take 1 tablet (20 mg total) by mouth daily.    Dispense:  90 tablet    Refill:  3    Patient Instructions  Medication Instructions:  Your physician recommends that you continue on your current medications as directed. Please refer to the Current Medication list given to you today.  *If you need a refill on your cardiac medications before your next appointment, please call your pharmacy*   Lab Work: None If you have labs (blood work) drawn today and your tests are completely normal, you will receive your results only by: Marland Kitchen MyChart Message (if you have MyChart) OR . A paper copy in the mail If you have any lab test that is abnormal or we need to change your treatment,  we will call you to review the results.   Testing/Procedures: None   Follow-Up: At [facility], you and your health needs are our priority.  As part of our continuing mission to provide you with exceptional heart care, we have created designated Provider Care Teams.  These Care Teams include your primary Cardiologist (physician) and Advanced Practice Providers (APPs -  Physician Assistants and Nurse Practitioners) who all work together to provide you with the care you need, when you need it.  We recommend signing up for the patient portal called "MyChart".  Sign up information is provided on this After Visit Summary.  MyChart is used to connect with patients for Virtual Visits (Telemedicine).  Patients are able to view lab/test results, encounter notes, upcoming appointments, etc.  Non-urgent messages [name] be sent to your provider as well.   To learn more about what you can do with MyChart, go to [email or web address].    Your next appointment:   12 month(s)  The format for your next appointment:   In Person  Provider:   You may see Tracey Grooms, MD or one of the following Advanced Practice Providers on your designated Care Team:    Truitt Merle, NP  Cecilie Kicks, NP  Kathyrn Drown, NP    Other Instructions      Signed, Tracey Grooms, MD  08/09/2020 12:49 PM    [place]

## 2020-08-08 DIAGNOSIS — Z85820 Personal history of malignant melanoma of skin: Secondary | ICD-10-CM | POA: Diagnosis not present

## 2020-08-08 DIAGNOSIS — Z85828 Personal history of other malignant neoplasm of skin: Secondary | ICD-10-CM | POA: Diagnosis not present

## 2020-08-08 DIAGNOSIS — C44321 Squamous cell carcinoma of skin of nose: Secondary | ICD-10-CM | POA: Diagnosis not present

## 2020-08-09 ENCOUNTER — Other Ambulatory Visit: Payer: Self-pay | Admitting: Family Medicine

## 2020-08-09 ENCOUNTER — Ambulatory Visit: Payer: PPO | Admitting: Interventional Cardiology

## 2020-08-09 ENCOUNTER — Encounter: Payer: Self-pay | Admitting: Family Medicine

## 2020-08-09 ENCOUNTER — Ambulatory Visit: Payer: PPO | Admitting: Family Medicine

## 2020-08-09 ENCOUNTER — Other Ambulatory Visit: Payer: Self-pay

## 2020-08-09 ENCOUNTER — Ambulatory Visit (INDEPENDENT_AMBULATORY_CARE_PROVIDER_SITE_OTHER): Payer: PPO

## 2020-08-09 ENCOUNTER — Encounter: Payer: Self-pay | Admitting: Interventional Cardiology

## 2020-08-09 VITALS — BP 124/82 | HR 77 | Ht 66.0 in | Wt 103.8 lb

## 2020-08-09 VITALS — HR 78 | Ht 66.0 in | Wt 103.0 lb

## 2020-08-09 DIAGNOSIS — I2510 Atherosclerotic heart disease of native coronary artery without angina pectoris: Secondary | ICD-10-CM

## 2020-08-09 DIAGNOSIS — Z7189 Other specified counseling: Secondary | ICD-10-CM

## 2020-08-09 DIAGNOSIS — M545 Low back pain, unspecified: Secondary | ICD-10-CM

## 2020-08-09 DIAGNOSIS — M48061 Spinal stenosis, lumbar region without neurogenic claudication: Secondary | ICD-10-CM | POA: Diagnosis not present

## 2020-08-09 DIAGNOSIS — E785 Hyperlipidemia, unspecified: Secondary | ICD-10-CM | POA: Diagnosis not present

## 2020-08-09 DIAGNOSIS — M5416 Radiculopathy, lumbar region: Secondary | ICD-10-CM

## 2020-08-09 DIAGNOSIS — I10 Essential (primary) hypertension: Secondary | ICD-10-CM

## 2020-08-09 DIAGNOSIS — I517 Cardiomegaly: Secondary | ICD-10-CM | POA: Diagnosis not present

## 2020-08-09 DIAGNOSIS — M999 Biomechanical lesion, unspecified: Secondary | ICD-10-CM

## 2020-08-09 DIAGNOSIS — M5136 Other intervertebral disc degeneration, lumbar region: Secondary | ICD-10-CM | POA: Diagnosis not present

## 2020-08-09 DIAGNOSIS — M62838 Other muscle spasm: Secondary | ICD-10-CM

## 2020-08-09 DIAGNOSIS — R102 Pelvic and perineal pain: Secondary | ICD-10-CM | POA: Diagnosis not present

## 2020-08-09 MED ORDER — ROSUVASTATIN CALCIUM 20 MG PO TABS: 20.0000 mg | ORAL_TABLET | Freq: Every day | ORAL | 11 refills | Status: DC

## 2020-08-09 MED ORDER — ROSUVASTATIN CALCIUM 20 MG PO TABS: 20.0000 mg | ORAL_TABLET | Freq: Every day | ORAL | 3 refills | Status: DC

## 2020-08-09 MED ORDER — METHOCARBAMOL 500 MG PO TABS: 500.0000 mg | ORAL_TABLET | Freq: Four times a day (QID) | ORAL | 3 refills | Status: DC

## 2020-08-09 MED FILL — METHOCARBAMOL 500 MG TABS: 500 | 9 days supply | Qty: 30 | Fill #0

## 2020-08-09 NOTE — Progress Notes (Signed)
[facility] [place] Phone: [phone] Subjective:   [name], am serving as a scribe for Dr. Hulan Saas. This visit occurred during the SARS-CoV-2 public health emergency.  Safety protocols were in place, including screening questions prior to the visit, additional usage of staff PPE, and extensive cleaning of exam room while observing appropriate contact time as indicated for disinfecting solutions.  I'm seeing this patient by the request  of:  [name], MD  CC:  Low aback pain   [id number]  Tracey [name] is a 69 y.o. female coming in with complaint of back and neck pain. OMT 06/27/2020. Patient states that she has been having left sided lumbar radiculopathy that seems to be increasing since last visit. Pain was worse 3 weeks ago but pain has been improving. Right hip is not catching today.           Reviewed prior external information including notes and imaging from previsou exam, outside providers and external EMR if available.   As well as notes that were available from care everywhere and other healthcare systems.  Past medical history, social, surgical and family history all reviewed in electronic medical record.  No pertanent information unless stated regarding to the chief complaint.   Past Medical History:  Diagnosis Date  . Closed hip fracture ([place]) 03/2016   RT HIP  . Hypertension   . Osteopenia   . Osteoporosis    h/o  . Pneumonia   . Skin cancer    multiple skin cancers    Allergies  Allergen Reactions  . Adhesive [Tape]   . Cefaclor Hives  . Fosamax [Alendronate Sodium] Other (See Comments)    Gi upset  . Neomycin-Bacitracin Zn-Polymyx Rash     Review of Systems:  No headache, visual changes, nausea, vomiting, diarrhea, constipation, dizziness, abdominal pain, skin rash, fevers, chills, night sweats, weight loss, swollen lymph nodes, body aches, joint swelling, chest  pain, shortness of breath, mood changes. POSITIVE muscle aches  Objective  There were no vitals taken for this visit.   General: No apparent distress alert and oriented x3 mood and affect normal, dressed appropriately.  HEENT: Pupils equal, extraocular movements intact  Respiratory: Patient's speak in full sentences and does not appear short of breath  Cardiovascular: No lower extremity edema, non tender, no erythema  Neuro: Cranial nerves II through XII are intact, neurovascularly intact in all extremities with 2+ DTRs and 2+ pulses.  Gait normal with good balance and coordination.  MSK:  Non tender with full range of motion and good stability and symmetric strength and tone of shoulders, elbows, wrist, hip, knee and ankles bilaterally.  Back -low back exam shows the patient does have some limited range of motion in all planes at the moment.  Patient does have some mild tightness noted.  Patient does not have any radicular symptoms but unfortunately does have significant voluntary guarding noted of the lower back.  Neck exam some mild tightness seems to be more in the parascapular region. Osteopathic findings  C2 flexed rotated and side bent right C6 flexed rotated and side bent left T3 extended rotated and side bent right inhaled rib       Assessment and Plan:  Lumbar radiculopathy Patient is having worsening radicular symptoms again at this time.  Patient is having more pain on a daily basis.  Affecting daily activities waking her up at night not responding to the medications patient surprisingly did  need surgical intervention back in 2020 and is having very similar symptoms again at this time.  I do believe we need to further evaluate with this MRI and potentially would be a candidate for possible epidurals.  Patient will have these different test done and we will see her again afterwards.  Neck muscle spasm Still mild spasm, has responded well to manipulation and more of a muscle  energy, avoiding high in amplitude with patient's history of potential osteoporosis.  Do not know if patient should ever have significant amount of osteopathic manipulation again long-term.  Patient did understand the potential risk involved.  Patient will get the lumbar spine more taking care of at this point.    Nonallopathic problems  Decision today to treat with OMT was based on Physical Exam  After verbal consent patient was treated with  ME, FPR techniques in cervical, rib, thoracic,areas due to patient's history of osteoporosis and avoided all high velocity low amplitude at this point.  Patient tolerated the procedure well with improvement in symptoms  Patient given exercises, stretches and lifestyle modifications  See medications in patient instructions if given  Patient will follow up in 4-8 weeks I do believe that this time probably is stopping with the osteopathic manipulation will be the most beneficial for her.      The above documentation has been reviewed and is accurate and complete Jacqualin Combes       Note: This dictation was prepared with Dragon dictation along with smaller phrase technology. Any transcriptional errors that result from this process are unintentional.

## 2020-08-09 NOTE — Assessment & Plan Note (Signed)
Still mild spasm, has responded well to manipulation and more of a muscle energy, avoiding high in amplitude with patient's history of potential osteoporosis.  Do not know if patient should ever have significant amount of osteopenia manipulation again long-term.  Patient did understand the potential risk involved.  Patient will get the lumbar spine more taking care of at this point.

## 2020-08-09 NOTE — Patient Instructions (Addendum)
Xray today MRI lumbar [id number]

## 2020-08-09 NOTE — Assessment & Plan Note (Addendum)
Patient is having worsening radicular symptoms again at this time.  Patient is having more pain on a daily basis.  Affecting daily activities waking her up at night not responding to the medications patient surprisingly did need surgical intervention back in 2020 and is having very similar symptoms again at this time.  I do believe we need to further evaluate with this MRI and potentially would be a candidate for possible epidurals.  Patient will have these different chain test done and we will see her again afterwards.

## 2020-08-09 NOTE — Patient Instructions (Signed)

## 2020-08-10 ENCOUNTER — Other Ambulatory Visit: Payer: Self-pay | Admitting: Obstetrics & Gynecology

## 2020-08-10 MED FILL — ESTRADIOL 0.1 MG/GM CREA: 0.1 | 90 days supply | Qty: 43 | Fill #0

## 2020-08-10 NOTE — Telephone Encounter (Signed)
CE scheduled 10/16/20.

## 2020-08-17 MED FILL — ROSUVASTATIN CALCIUM 20 MG: 20 | 90 days supply | Qty: 90 | Fill #0

## 2020-08-27 MED FILL — METHOCARBAMOL 500 MG TABS: 500 | 7 days supply | Qty: 30 | Fill #1

## 2020-08-29 ENCOUNTER — Ambulatory Visit
Admission: RE | Admit: 2020-08-29 | Discharge: 2020-08-29 | Disposition: A | Discharge status: Home / Self Care | Payer: PPO | Source: Ambulatory Visit | Attending: Family Medicine | Admitting: Family Medicine

## 2020-08-29 DIAGNOSIS — M48061 Spinal stenosis, lumbar region without neurogenic claudication: Secondary | ICD-10-CM | POA: Diagnosis not present

## 2020-08-29 DIAGNOSIS — M545 Low back pain, unspecified: Secondary | ICD-10-CM

## 2020-09-06 ENCOUNTER — Other Ambulatory Visit (HOSPITAL_COMMUNITY): Payer: Self-pay | Admitting: Dermatology

## 2020-09-06 DIAGNOSIS — C44329 Squamous cell carcinoma of skin of other parts of face: Secondary | ICD-10-CM | POA: Diagnosis not present

## 2020-09-06 DIAGNOSIS — Z85828 Personal history of other malignant neoplasm of skin: Secondary | ICD-10-CM | POA: Diagnosis not present

## 2020-09-06 DIAGNOSIS — Z85820 Personal history of malignant melanoma of skin: Secondary | ICD-10-CM | POA: Diagnosis not present

## 2020-09-06 MED FILL — DOXYCYCLINE HYCLATE 100 MG: 100 | 5 days supply | Qty: 10 | Fill #0

## 2020-09-25 MED FILL — METHOCARBAMOL 500 MG TABS: 500 | 7 days supply | Qty: 30 | Fill #2

## 2020-10-01 ENCOUNTER — Other Ambulatory Visit: Payer: Self-pay | Admitting: Obstetrics & Gynecology

## 2020-10-01 DIAGNOSIS — Z1231 Encounter for screening mammogram for malignant neoplasm of breast: Secondary | ICD-10-CM

## 2020-10-02 ENCOUNTER — Other Ambulatory Visit: Payer: Self-pay | Admitting: Family Medicine

## 2020-10-02 ENCOUNTER — Encounter: Payer: Self-pay | Admitting: Family Medicine

## 2020-10-02 ENCOUNTER — Other Ambulatory Visit: Payer: Self-pay

## 2020-10-02 ENCOUNTER — Ambulatory Visit: Payer: PPO | Admitting: Family Medicine

## 2020-10-02 VITALS — BP 106/76 | HR 69 | Ht 66.0 in | Wt 106.0 lb

## 2020-10-02 DIAGNOSIS — M810 Age-related osteoporosis without current pathological fracture: Secondary | ICD-10-CM | POA: Diagnosis not present

## 2020-10-02 DIAGNOSIS — M5416 Radiculopathy, lumbar region: Secondary | ICD-10-CM

## 2020-10-02 MED ORDER — METHOCARBAMOL 500 MG PO TABS
500.0000 mg | ORAL_TABLET | Freq: Three times a day (TID) | ORAL | 1 refills | Status: DC | PRN
Start: 2020-10-02 — End: 2020-10-02

## 2020-10-02 MED FILL — METHOCARBAMOL 500 MG TABS: 500 | 30 days supply | Qty: 90 | Fill #0

## 2020-10-02 NOTE — Assessment & Plan Note (Signed)
Continue the Reclast at this time.  This likely makes patient not a surgical candidate because anything in the back potentially need more of a fusion.

## 2020-10-02 NOTE — Patient Instructions (Signed)
Robaxin refilled L3/L4 epidural Continue exercises Stay active Keep using seat pads Let's avoid the bike for now See me 4-5 weeks after injection

## 2020-10-02 NOTE — Assessment & Plan Note (Signed)
Patient continues to have trouble.  Patient's MRI do show some maybe mild increase in L4-L5.  As well as L3-L4 on the right side causing potentially more radicular symptoms in the gluteal area and the lateral hip.  We will try an epidural at the L3-L4.  Patient feels that the methocarbamol has been the most beneficial and she uses it very sparingly.  Patient had a refill of 500 mg up to 3 times a day but patient will only be taking it at night.  Athletic trainer was in the room at same time we warned patient.  Patient is to increase activity as tolerated.  Discussed posture and ergonomics.  Patient will follow up again in 4 weeks after the epidural.

## 2020-10-02 NOTE — Progress Notes (Signed)
[place] Phone: [phone] Subjective:   Tracey [name], am serving as a scribe for Dr. Hulan Saas. This visit occurred during the SARS-CoV-2 public health emergency.  Safety protocols were in place, including screening questions prior to the visit, additional usage of staff PPE, and extensive cleaning of exam room while observing appropriate contact time as indicated for disinfecting solutions.   I'm seeing this patient by the request  of:  [name], MD  CC: Low back pain follow-up  [id number]   08/09/2020 Still mild spasm, has responded well to manipulation and more of a muscle energy, avoiding high in amplitude with patient's history of potential osteoporosis.  Do not know if patient should ever have significant amount of osteopenia manipulation again long-term.  Patient did understand the potential risk involved.  Patient will get the lumbar spine more taking care of at this point.  Patient is having worsening radicular symptoms again at this time.  Patient is having more pain on a daily basis.  Affecting daily activities waking her up at night not responding to the medications patient surprisingly did need surgical intervention back in 2020 and is having very similar symptoms again at this time.  I do believe we need to further evaluate with this MRI and potentially would be a candidate for possible epidurals.  Patient will have these different chain test done and we will see her again afterwards.  Update 10/02/2020 Tracey [name] is a 69 y.o. female coming in with complaint of neck and lumbar spine pain. Patient states that today her pain is minimal. Pain is intermittent in left hamstring and right greater trochanter.   MRI Lumbar 08/29/2020 IMPRESSION: 1. Similar postsurgical changes at L5-S1 with similar mild left foraminal stenosis and mild left subarticular recess stenosis. 2. [name] significant canal  stenosis. Similar mild l subarticular recess narrowing on the right at L3-L4 and the left at L4-L5. 3. Degenerative disc disease is greatest at L2-L3 and L5-S1 with mild discogenic endplate signal changes.     Past Medical History:  Diagnosis Date  . Closed hip fracture ([place]) 03/2016   RT HIP  . Hypertension   . Osteopenia   . Osteoporosis    h/o  . Pneumonia   . Skin cancer    multiple skin cancers   Past Surgical History:  Procedure Laterality Date  . CALDWELL LUC    . COLONOSCOPY WITH PROPOFOL N/A 03/22/2014   Procedure: COLONOSCOPY WITH PROPOFOL;  Surgeon: Arta Silence, MD;  Location: [facility];  Service: Endoscopy;  Laterality: N/A;  . HIP PINNING,CANNULATED Right 03/21/2016   Procedure: CANNULATED HIP PINNING;  Surgeon: Rod Can, MD;  Location: [facility];  Service: Orthopedics;  Laterality: Right;  . HIP SURGERY    . LUMBAR LAMINECTOMY/DECOMPRESSION MICRODISCECTOMY Left 02/14/2019   Procedure: Laminectomy and Foraminotomy - L5-S1 - left;  Surgeon: Eustace Moore, MD;  Location: [facility];  Service: Neurosurgery;  Laterality: Left;  Laminectomy and Foraminotomy - L5-S1 - left  . NASAL SEPTUM SURGERY     past hx. deviated septum  . TONSILLECTOMY     Social History   Socioeconomic History  . Marital status: Married    Spouse name: [name]  . Number of children: 1  . Years of education: Not on file  . Highest education level: Not on file  Occupational History  . Occupation: MED TECH    Employer: [employer]  Tobacco Use  .  Smoking status: Never Smoker  . Smokeless tobacco: Never Used  Vaping Use  . Vaping Use: Never used  Substance and Sexual Activity  . Alcohol use: Not Currently    Alcohol/week: 0.3 standard drinks  . Drug use: [name]  . Sexual activity: Yes    Partners: Male    Comment: married, [place]  Other Topics Concern  . Not on file  Social History Narrative  . Not on file   Social Determinants of Health   Financial Resource Strain:   .  Difficulty of Paying Living Expenses: Not on file  Food Insecurity:   . Worried About Charity fundraiser in the Last Year: Not on file  . Ran Out of Food in the Last Year: Not on file  Transportation Needs:   . Lack of Transportation (Medical): Not on file  . Lack of Transportation (Non-Medical): Not on file  Physical Activity:   . Days of Exercise per Week: Not on file  . Minutes of Exercise per Session: Not on file  Stress:   . Feeling of Stress : Not on file  Social Connections:   . Frequency of Communication with Friends and Family: Not on file  . Frequency of Social Gatherings with Friends and Family: Not on file  . Attends Religious Services: Not on file  . Active Member of Clubs or Organizations: Not on file  . Attends Archivist Meetings: Not on file  . Marital Status: Not on file   Allergies  Allergen Reactions  . Adhesive [Tape]   . Cefaclor Hives  . Fosamax [Alendronate Sodium] Other (See Comments)    Gi upset  . Neomycin-Bacitracin Zn-Polymyx Rash   Family History  Problem Relation Age of Onset  . Hypertension Mother   . Leukemia Father   . Hypertension Father   . Diabetes Brother   . Hypertension Brother   . Hyperlipidemia Maternal Grandmother   . Cancer Maternal Grandfather   . Heart disease Paternal Grandfather   . Stroke Paternal Grandfather   . Breast cancer Maternal Aunt   . Breast cancer Paternal Aunt     Current Outpatient Medications (Endocrine & Metabolic):  Marland Kitchen  Zoledronic Acid (RECLAST IV), Inject 1 Dose into the vein once. Once a year  Current Outpatient Medications (Cardiovascular):  .  amLODipine (NORVASC) 5 MG tablet, Take 5 mg by mouth every evening. .  rosuvastatin (CRESTOR) 20 MG tablet, Take 1 tablet (20 mg total) by mouth daily.   Current Outpatient Medications (Analgesics):  .  acetaminophen (TYLENOL) 500 MG tablet, Take 1,000 mg by mouth every 6 (six) hours as needed (for pain.). Marland Kitchen  aspirin EC 81 MG tablet, Take 81 mg by  mouth every evening. .  nabumetone (RELAFEN) 500 MG tablet, Take 1 tablet (500 mg total) by mouth 2 (two) times daily as needed.  Current Outpatient Medications (Hematological):  .  folic acid (FOLVITE) 1 MG tablet, Take 1 mg by mouth daily.    Current Outpatient Medications (Other):  .  b complex vitamins tablet, Take 1 tablet by mouth daily. .  Biotin 1 MG CAPS, Take 1 capsule by mouth daily.  .  cholecalciferol (VITAMIN D) 25 MCG (1000 UT) tablet, Take 1,000 Units by mouth daily. Marland Kitchen  estradiol (ESTRACE) 0.1 MG/GM vaginal cream, INSERT 1/2 APPLICATORFULS VAGINALLY 2 (TWO) TIMES A WEEK. [place] .  fish oil-omega-3 fatty acids 1000 MG capsule, Take 1,000 mg by mouth 2 (two) times daily.  Marland Kitchen  gabapentin (NEURONTIN) 400 MG  capsule, Take 400 mg by mouth at bedtime. .  methocarbamol (ROBAXIN) 500 MG tablet, Take 1 tablet (500 mg total) by mouth every 8 (eight) hours as needed for muscle spasms. .  metroNIDAZOLE (METROGEL) 1 % gel, Apply 1 application topically 2 (two) times daily. Applied to face .  Multiple Vitamin (MULTIVITAMIN WITH MINERALS) TABS tablet, Take 1 tablet by mouth daily. Vladimir Faster Glycol-Propyl Glycol (SYSTANE OP), Place 1 drop into both eyes 2 (two) times daily as needed (Dry/Irritated eyes). .  psyllium (METAMUCIL) 58.6 % packet, Take 1 packet by mouth daily.   Reviewed prior external information including notes and imaging from  primary care provider As well as notes that were available from care everywhere and other healthcare systems.  Past medical history, social, surgical and family history all reviewed in electronic medical record.  [name] pertanent information unless stated regarding to the chief complaint.   Review of Systems:  [name] headache, visual changes, nausea, vomiting, diarrhea, constipation, dizziness, abdominal pain, skin rash, fevers, chills, night sweats, weight loss, swollen lymph nodes, body aches, joint swelling, chest pain, shortness of breath,  mood changes. POSITIVE muscle aches  Objective  Blood pressure 106/76, pulse 69, height 5\' 6"  (1.676 m), weight 106 lb (48.1 kg), SpO2 99 %.   General: [name] apparent distress alert and oriented x3 mood and affect normal, dressed appropriately.  HEENT: Pupils equal, extraocular movements intact  Respiratory: Patient's speak in full sentences and does not appear short of breath  Cardiovascular: [name] lower extremity edema, non tender, [name] erythema  MSK:   arthritic changes noted. Low back exam does show some mild scoliosis noted.  Patient has good strength.  He does have tenderness over the left ischial area and the right gluteal area.  Patient does have some tightness noted in the paraspinal musculature.  Mild radicular symptoms down the left leg noted today ago.  Overall the patient does appear to be comfortable.    Impression and Recommendations:     The above documentation has been reviewed and is accurate and complete [name], DO

## 2020-10-09 DIAGNOSIS — D2261 Melanocytic nevi of right upper limb, including shoulder: Secondary | ICD-10-CM | POA: Diagnosis not present

## 2020-10-09 DIAGNOSIS — D225 Melanocytic nevi of trunk: Secondary | ICD-10-CM | POA: Diagnosis not present

## 2020-10-09 DIAGNOSIS — L080 Pyoderma: Secondary | ICD-10-CM | POA: Diagnosis not present

## 2020-10-09 DIAGNOSIS — D1801 Hemangioma of skin and subcutaneous tissue: Secondary | ICD-10-CM | POA: Diagnosis not present

## 2020-10-09 DIAGNOSIS — D2271 Melanocytic nevi of right lower limb, including hip: Secondary | ICD-10-CM | POA: Diagnosis not present

## 2020-10-09 DIAGNOSIS — L821 Other seborrheic keratosis: Secondary | ICD-10-CM | POA: Diagnosis not present

## 2020-10-09 DIAGNOSIS — Z85828 Personal history of other malignant neoplasm of skin: Secondary | ICD-10-CM | POA: Diagnosis not present

## 2020-10-09 DIAGNOSIS — L570 Actinic keratosis: Secondary | ICD-10-CM | POA: Diagnosis not present

## 2020-10-09 DIAGNOSIS — D485 Neoplasm of uncertain behavior of skin: Secondary | ICD-10-CM | POA: Diagnosis not present

## 2020-10-09 DIAGNOSIS — Z85820 Personal history of malignant melanoma of skin: Secondary | ICD-10-CM | POA: Diagnosis not present

## 2020-10-10 ENCOUNTER — Ambulatory Visit
Admission: RE | Admit: 2020-10-10 | Discharge: 2020-10-10 | Disposition: A | Discharge status: Home / Self Care | Payer: PPO | Source: Ambulatory Visit | Attending: Family Medicine | Admitting: Family Medicine

## 2020-10-10 DIAGNOSIS — M5416 Radiculopathy, lumbar region: Secondary | ICD-10-CM

## 2020-10-10 DIAGNOSIS — M47817 Spondylosis without myelopathy or radiculopathy, lumbosacral region: Secondary | ICD-10-CM | POA: Diagnosis not present

## 2020-10-10 MED ORDER — IOPAMIDOL (ISOVUE-M 200) INJECTION 41%
1.0000 mL | Freq: Once | INTRAMUSCULAR | Status: AC
  Administered 2020-10-10: 1 mL via EPIDURAL

## 2020-10-10 MED ORDER — METHYLPREDNISOLONE ACETATE 40 MG/ML INJ SUSP (RADIOLOG
120.0000 mg | Freq: Once | INTRAMUSCULAR | Status: AC
  Administered 2020-10-10: 120 mg via EPIDURAL

## 2020-10-10 NOTE — Discharge Instructions (Signed)

## 2020-10-16 ENCOUNTER — Encounter: Payer: Self-pay | Admitting: Obstetrics & Gynecology

## 2020-10-16 ENCOUNTER — Ambulatory Visit (INDEPENDENT_AMBULATORY_CARE_PROVIDER_SITE_OTHER): Payer: PPO | Admitting: Obstetrics & Gynecology

## 2020-10-16 ENCOUNTER — Other Ambulatory Visit: Payer: Self-pay

## 2020-10-16 VITALS — BP 144/86 | Ht 65.0 in | Wt 103.0 lb

## 2020-10-16 DIAGNOSIS — Z780 Asymptomatic menopausal state: Secondary | ICD-10-CM

## 2020-10-16 DIAGNOSIS — Z01419 Encounter for gynecological examination (general) (routine) without abnormal findings: Secondary | ICD-10-CM

## 2020-10-16 DIAGNOSIS — N952 Postmenopausal atrophic vaginitis: Secondary | ICD-10-CM

## 2020-10-16 DIAGNOSIS — M810 Age-related osteoporosis without current pathological fracture: Secondary | ICD-10-CM

## 2020-10-16 NOTE — Progress Notes (Signed)
Tracey [name] 08-Oct-1951 [id number]   History:    69 y.o.  G1P1L1 Married.  Dauhgter is 69 yo, has 2 boys.  RP:  Established patient presenting for annual gyn exam   HPI: Small hematometra, EBx 10/2018 Benign.  Postmenopause, well on no HRT.  No PMB.  No pelvic pain.  No pain with IC on Estrace cream.  Urine/BMs normal.  Breasts normal.  BMI 17.14. Walking. Health labs with Fam MD.  Past medical history,surgical history, family history and social history were all reviewed and documented in the EPIC chart.  Gynecologic History No LMP recorded. Patient is postmenopausal.  Obstetric History OB History  Gravida Para Term Preterm AB Living  1 1       1   SAB TAB Ectopic Multiple Live Births               # Outcome Date GA Lbr Len/2nd Weight Sex Delivery Anes PTL Lv  1 Para 1978    F Vag-Spont        ROS: A ROS was performed and pertinent positives and negatives are included in the history.  GENERAL: No fevers or chills. HEENT: No change in vision, no earache, sore throat or sinus congestion. NECK: No pain or stiffness. CARDIOVASCULAR: No chest pain or pressure. No palpitations. PULMONARY: No shortness of breath, cough or wheeze. GASTROINTESTINAL: No abdominal pain, nausea, vomiting or diarrhea, melena or bright red blood per rectum. GENITOURINARY: No urinary frequency, urgency, hesitancy or dysuria. MUSCULOSKELETAL: No joint or muscle pain, no back pain, no recent trauma. DERMATOLOGIC: No rash, no itching, no lesions. ENDOCRINE: No polyuria, polydipsia, no heat or cold intolerance. No recent change in weight. HEMATOLOGICAL: No anemia or easy bruising or bleeding. NEUROLOGIC: No headache, seizures, numbness, tingling or weakness. PSYCHIATRIC: No depression, no loss of interest in normal activity or change in sleep pattern.     Exam:   BP (!) 144/86   Ht 5\' 5"  (1.651 m)   Wt 103 lb (46.7 kg)   BMI 17.14 kg/m   Body mass index is 17.14 kg/m.  General appearance : Well  developed well nourished female. No acute distress HEENT: Eyes: no retinal hemorrhage or exudates,  Neck supple, trachea midline, no carotid bruits, no thyroidmegaly Lungs: Clear to auscultation, no rhonchi or wheezes, or rib retractions  Heart: Regular rate and rhythm, no murmurs or gallops Breast:Examined in sitting and supine position were symmetrical in appearance, no palpable masses or tenderness,  no skin retraction, no nipple inversion, no nipple discharge, no skin discoloration, no axillary or supraclavicular lymphadenopathy Abdomen: no palpable masses or tenderness, no rebound or guarding Extremities: no edema or skin discoloration or tenderness  Pelvic: Vulva: Normal             Vagina: No gross lesions or discharge  Cervix: No gross lesions or discharge  Uterus  AV, normal size, shape and consistency, non-tender and mobile  Adnexa  Without masses or tenderness  Anus: Normal   Assessment/Plan:  69 y.o. female for annual exam   1. Well female exam with routine gynecological exam Normal gynecologic exam in menopause.  No indication for a Pap test.  Breasts normal.  Mammo 11/2019 Neg.  [place] 2015.  Health labs with Fam MD.  BMI 17.14.  Recommend increasing Calories.  Good fitness.  2. Postmenopause Well on no HRT.  No PMB.    3. Postmenopausal atrophic vaginitis Estradiol cream represcribed.  No CI to continue.  4. Age-related osteoporosis without current pathological  fracture Osteoporosis T Score -3.7 at Forearm radius on BD 07/2020.  On Reclast.  Continue Vit D supplement, Ca++ 1500 mg daily, regular weight bearing physical activities.  Other orders - estradiol (ESTRACE) 0.1 MG/GM vaginal cream; INSERT 1/2 APPLICATORFULS VAGINALLY 2 (TWO) TIMES A WEEK. [facility]DA[facility] & [facility]  Princess Bruins MD, 4:04 PM 10/16/2020

## 2020-10-18 ENCOUNTER — Other Ambulatory Visit (HOSPITAL_COMMUNITY): Payer: Self-pay | Admitting: Internal Medicine

## 2020-10-18 MED FILL — FOLIC ACID 1 MG TABS: 1 | 90 days supply | Qty: 90 | Fill #0

## 2020-10-18 MED FILL — AMLODIPINE BESYLATE 5 MG TA: 5 | 90 days supply | Qty: 90 | Fill #0

## 2020-10-24 ENCOUNTER — Encounter: Payer: Self-pay | Admitting: Obstetrics & Gynecology

## 2020-10-24 ENCOUNTER — Other Ambulatory Visit: Payer: Self-pay | Admitting: Obstetrics & Gynecology

## 2020-10-24 MED ORDER — ESTRADIOL 0.1 MG/GM VA CREA
TOPICAL_CREAM | VAGINAL | 4 refills | Status: DC
Start: 2020-10-24 — End: 2020-10-24
  Filled 2021-02-11: qty 42.5, 30d supply, fill #0

## 2020-10-24 MED FILL — ESTRADIOL 0.1 MG/GM CREA: 0.1 | 90 days supply | Qty: 43 | Fill #0

## 2020-11-02 ENCOUNTER — Other Ambulatory Visit: Payer: Self-pay | Admitting: Anesthesiology

## 2020-11-02 DIAGNOSIS — N857 Hematometra: Secondary | ICD-10-CM

## 2020-11-06 ENCOUNTER — Other Ambulatory Visit: Payer: Self-pay

## 2020-11-06 ENCOUNTER — Ambulatory Visit
Admission: RE | Admit: 2020-11-06 | Discharge: 2020-11-06 | Disposition: A | Discharge status: Home / Self Care | Payer: PPO | Source: Ambulatory Visit | Attending: Obstetrics & Gynecology | Admitting: Obstetrics & Gynecology

## 2020-11-06 DIAGNOSIS — Z1231 Encounter for screening mammogram for malignant neoplasm of breast: Secondary | ICD-10-CM

## 2020-11-06 NOTE — Progress Notes (Signed)
[name] [place] Phone: [phone] Subjective:   Rito Ehrlich, am serving as a scribe for Dr. Hulan Saas. This visit occurred during the SARS-CoV-2 public health emergency.  Safety protocols were in place, including screening questions prior to the visit, additional usage of staff PPE, and extensive cleaning of exam room while observing appropriate contact time as indicated for disinfecting solutions.   I'm seeing this patient by the request  of:  [name], MD  CC: Low back pain follow-up  [id number]  Tracey [name] is a 69 y.o. female coming in with complaint of low back pain  From 11/2 visit  Patient continues to have trouble.  Patient's MRI do show some maybe mild increase in L4-L5.  As well as L3-L4 on the right side causing potentially more radicular symptoms in the gluteal area and the lateral hip.  We will try an epidural at the L3-L4.  Patient feels that the methocarbamol has been the most beneficial and she uses it very sparingly.  Patient had a refill of 500 mg up to 3 times a day but patient will only be taking it at night.  Athletic trainer was in the room at same time we warned patient.  Patient is to increase activity as tolerated.  Discussed posture and ergonomics.  Patient will follow up again in 4 weeks after the epidural.   Patient had a repeat epidural October 10, 2020 this is at the L3-L4 level  Update 11/07/2020 Patient states that she maybe a little better on the right but feels it could be better.  Past Medical History:  Diagnosis Date  . CAD (coronary artery disease)   . Closed hip fracture ([place]) 03/2016   RT HIP  . Hypertension   . Osteopenia   . Osteoporosis    h/o  . Pneumonia   . Skin cancer    multiple skin cancers   Past Surgical History:  Procedure Laterality Date  . CALDWELL LUC    . COLONOSCOPY WITH PROPOFOL N/A 03/22/2014   Procedure: COLONOSCOPY WITH  PROPOFOL;  Surgeon: Arta Silence, MD;  [facility]: [facility];  Service: Endoscopy;  Laterality: N/A;  . HIP PINNING,CANNULATED Right 03/21/2016   Procedure: CANNULATED HIP PINNING;  Surgeon: Rod Can, MD;  Location: [facility];  Service: Orthopedics;  Laterality: Right;  . HIP SURGERY    . LUMBAR LAMINECTOMY/DECOMPRESSION MICRODISCECTOMY Left 02/14/2019   Procedure: Laminectomy and Foraminotomy - L5-S1 - left;  Surgeon: Eustace Moore, MD;  Location: [facility];  Service: Neurosurgery;  Laterality: Left;  Laminectomy and Foraminotomy - L5-S1 - left  . MOHS SURGERY    . NASAL SEPTUM SURGERY     past hx. deviated septum  . TONSILLECTOMY     Social History   Socioeconomic History  . Marital status: Married    Spouse name: [name]  . Number of children: 1  . Years of education: Not on file  . Highest education level: Not on file  Occupational History  . Occupation: MED TECH    Employer: [employer]  Tobacco Use  . Smoking status: Never Smoker  . Smokeless tobacco: Never Used  Vaping Use  . Vaping Use: Never used  Substance and Sexual Activity  . Alcohol use: Yes    Alcohol/week: 0.3 standard drinks    Comment: Rare  . Drug use: No  . Sexual activity: Yes    Partners: Male    Comment: married, [place]  Other Topics Concern  . Not on file  Social History Narrative  . Not on file   Social Determinants of Health   Financial Resource Strain:   . Difficulty of Paying Living Expenses: Not on file  Food Insecurity:   . Worried About Charity fundraiser in the Last Year: Not on file  . Ran Out of Food in the Last Year: Not on file  Transportation Needs:   . Lack of Transportation (Medical): Not on file  . Lack of Transportation (Non-Medical): Not on file  Physical Activity:   . Days of Exercise per Week: Not on file  . Minutes of Exercise per Session: Not on file  Stress:   . Feeling of Stress : Not on file  Social Connections:   . Frequency of Communication with Friends  and Family: Not on file  . Frequency of Social Gatherings with Friends and Family: Not on file  . Attends Religious Services: Not on file  . Active Member of Clubs or Organizations: Not on file  . Attends Archivist Meetings: Not on file  . Marital Status: Not on file   Allergies  Allergen Reactions  . Cefaclor Hives  . Adhesive [Tape]   . Fosamax [Alendronate Sodium] Nausea Only    Gi upset  . Neomycin-Bacitracin Zn-Polymyx Rash   Family History  Problem Relation Age of Onset  . Hypertension Mother   . Leukemia Father   . Hypertension Father   . Diabetes Brother   . Hypertension Brother   . Hyperlipidemia Maternal Grandmother   . Cancer Maternal Grandfather   . Heart disease Paternal Grandfather   . Stroke Paternal Grandfather   . Breast cancer Maternal Aunt   . Breast cancer Paternal Aunt     Current Outpatient Medications (Endocrine & Metabolic):  Marland Kitchen  Zoledronic Acid (RECLAST IV), Inject 1 Dose into the vein once. Once a year  Current Outpatient Medications (Cardiovascular):  .  amLODipine (NORVASC) 5 MG tablet, Take 5 mg by mouth every evening. .  rosuvastatin (CRESTOR) 20 MG tablet, Take 1 tablet (20 mg total) by mouth daily.   Current Outpatient Medications (Analgesics):  .  acetaminophen (TYLENOL) 500 MG tablet, Take 1,000 mg by mouth every 6 (six) hours as needed (for pain.). Marland Kitchen  aspirin EC 81 MG tablet, Take 81 mg by mouth every evening. .  nabumetone (RELAFEN) 500 MG tablet, Take 1 tablet (500 mg total) by mouth 2 (two) times daily as needed.  Current Outpatient Medications (Hematological):  .  folic acid (FOLVITE) 1 MG tablet, Take 1 mg by mouth daily.    Current Outpatient Medications (Other):  .  b complex vitamins tablet, Take 1 tablet by mouth daily. .  Biotin 1 MG CAPS, Take 1 capsule by mouth daily.  .  cholecalciferol (VITAMIN D) 25 MCG (1000 UT) tablet, Take 1,000 Units by mouth daily. Marland Kitchen  estradiol (ESTRACE) 0.1 MG/GM vaginal cream, INSERT  1/2 APPLICATORFULS VAGINALLY 2 (TWO) TIMES A WEEK. [place] .  fish oil-omega-3 fatty acids 1000 MG capsule, Take 1,000 mg by mouth 2 (two) times daily.  Marland Kitchen  gabapentin (NEURONTIN) 400 MG capsule, Take 400 mg by mouth at bedtime. .  methocarbamol (ROBAXIN) 500 MG tablet, Take 1 tablet (500 mg total) by mouth every 8 (eight) hours as needed for muscle spasms. .  metroNIDAZOLE (METROGEL) 1 % gel, Apply 1 application topically 2 (two) times daily. Applied to face .  Multiple Vitamin (MULTIVITAMIN WITH MINERALS) TABS tablet, Take  1 tablet by mouth daily. Vladimir Faster Glycol-Propyl Glycol (SYSTANE OP), Place 1 drop into both eyes 2 (two) times daily as needed (Dry/Irritated eyes). .  psyllium (METAMUCIL) 58.6 % packet, Take 1 packet by mouth daily. .  DULoxetine (CYMBALTA) 20 MG capsule, Take 1 capsule (20 mg total) by mouth daily.   Reviewed prior external information including notes and imaging from  primary care provider As well as notes that were available from care everywhere and other healthcare systems.  Past medical history, social, surgical and family history all reviewed in electronic medical record.  No pertanent information unless stated regarding to the chief complaint.   Review of Systems:  No headache, visual changes, nausea, vomiting, diarrhea, constipation, dizziness, abdominal pain, skin rash, fevers, chills, night sweats, weight loss, swollen lymph nodes, body aches, joint swelling, chest pain, shortness of breath, mood changes. POSITIVE muscle aches  Objective  Blood pressure 110/72, pulse 63, height 5\' 5"  (1.651 m), weight 105 lb (47.6 kg), SpO2 99 %.   General: No apparent distress alert and oriented x3 mood and affect normal, dressed appropriately.  HEENT: Pupils equal, extraocular movements intact  Respiratory: Patient's speak in full sentences and does not appear short of breath  Cardiovascular: No lower extremity edema, non tender, no erythema  MSK:    Patient's low back does have some loss of lordosis with some mild degenerative scoliosis.  Comfortable sitting in chair.  Patient does have no atrophy of the lower extremities bilaterally.  Knee exam does show some very mild arthritic changes.  Patient does have lateral tracking of the patella noted.  Patient has very mild instability noted with varus and valgus force.  Patient has full range of motion otherwise.   Impression and Recommendations:     The above documentation has been reviewed and is accurate and complete [name], DO

## 2020-11-07 ENCOUNTER — Encounter: Payer: Self-pay | Admitting: Family Medicine

## 2020-11-07 ENCOUNTER — Other Ambulatory Visit: Payer: Self-pay | Admitting: Family Medicine

## 2020-11-07 ENCOUNTER — Other Ambulatory Visit: Payer: Self-pay

## 2020-11-07 ENCOUNTER — Ambulatory Visit: Payer: PPO | Admitting: Family Medicine

## 2020-11-07 DIAGNOSIS — M222X1 Patellofemoral disorders, right knee: Secondary | ICD-10-CM

## 2020-11-07 DIAGNOSIS — M5416 Radiculopathy, lumbar region: Secondary | ICD-10-CM

## 2020-11-07 DIAGNOSIS — M222X2 Patellofemoral disorders, left knee: Secondary | ICD-10-CM | POA: Insufficient documentation

## 2020-11-07 DIAGNOSIS — M810 Age-related osteoporosis without current pathological fracture: Secondary | ICD-10-CM | POA: Diagnosis not present

## 2020-11-07 MED ORDER — DULOXETINE HCL 20 MG PO CPEP: 20.0000 mg | ORAL_CAPSULE | Freq: Every day | ORAL | 3 refills | Status: DC

## 2020-11-07 MED FILL — DULoxetine HCL 20 MG CPEP: 20 | 30 days supply | Qty: 30 | Fill #0

## 2020-11-07 NOTE — Assessment & Plan Note (Signed)
Patient continues to have more of a lumbar radiculopathy.  We discussed with patient in great length.  Patient is on gabapentin but I am concerned with increasing the dose anymore at this time.  Patient is taking 400 mg.  We discussed the potential for daytime dosing.  Patient at this point will be started on a very low dose of Cymbalta.  Patient could potentially respond well hopefully.  We discussed potential side effects.  Encourage patient to continue with the vitamin D supplementation.  If patient continues to have pain we can consider the possibility of an epidural again or we need to discuss again with neurosurgery for surgical intervention.  Follow-up again in 6 weeks

## 2020-11-07 NOTE — Patient Instructions (Addendum)
Good to see you  Cymbalta 20 mg daily  Continue the gabapentin  Voltaren Gel OTC 2 a day Recumbent bike Exercises given Send message in two weeks with an update See me again in 6 weeks

## 2020-11-07 NOTE — Assessment & Plan Note (Signed)
Likely arthritic changes.  Patient does have some atrophy of the legs that could be secondary to more of the back and the nerve aspect.  We discussed with patient and she wants to start with home exercises.  We discussed the VMO strengthening and such things as biking that would be beneficial.  Discussed icing regimen.  Discussed the potential for injections at follow-up in following up again in 6 weeks

## 2020-11-07 NOTE — Assessment & Plan Note (Signed)
Severe overall.  We will need to continue to monitor.  Continue vitamin D supplementation.

## 2020-11-15 ENCOUNTER — Ambulatory Visit (INDEPENDENT_AMBULATORY_CARE_PROVIDER_SITE_OTHER): Payer: PPO

## 2020-11-15 ENCOUNTER — Ambulatory Visit (INDEPENDENT_AMBULATORY_CARE_PROVIDER_SITE_OTHER): Payer: PPO | Admitting: Obstetrics & Gynecology

## 2020-11-15 ENCOUNTER — Encounter: Payer: Self-pay | Admitting: Obstetrics & Gynecology

## 2020-11-15 ENCOUNTER — Other Ambulatory Visit: Payer: Self-pay

## 2020-11-15 DIAGNOSIS — N857 Hematometra: Secondary | ICD-10-CM

## 2020-11-15 NOTE — Progress Notes (Signed)
    Tracey [name] 02-11-1951 [id number]        69 y.o.  G1P1L1 Married  RP: H/O Hematometra for Pelvic US  HPI: No change x last visit.  On 10/16/2020 we noted: Small hematometra,EBx 10/2018 Benign.Postmenopause, well on no HRT. No PMB. No pelvic pain. No pain with IC on Estrace cream. Urine/BMs normal.    OB History  Gravida Para Term Preterm AB Living  1 1       1   SAB IAB Ectopic Multiple Live Births               # Outcome Date GA Lbr Len/2nd Weight Sex Delivery Anes PTL Lv  1 Para 1978    F Vag-Spont       Past medical history,surgical history, problem list, medications, allergies, family history and social history were all reviewed and documented in the EPIC chart.   Directed ROS with pertinent positives and negatives documented in the history of present illness/assessment and plan.  Exam:  There were no vitals filed for this visit. General appearance:  Normal  Pelvic US today: T/V images.  Anteverted uterus atrophic with no myometrial mass measured at 4.86 x 3.19 x 1.92 cm.  The endometrial lining is thin and symmetrical with no mass or thickening measured at 2.24 mm.  Both ovaries are atrophic.  No adnexal mass.  No free fluid in the posterior cul-de-sac.   Assessment/Plan:  69 y.o. G1P1L1  1. Hematometra Pelvic ultrasound findings reviewed thoroughly with patient.  The uterus and ovaries are normal.  The endometrial line is thin at 2.24 mm.  Hematometra resolved.  Patient reassured.  Princess Bruins MD, 2:32 PM 11/15/2020

## 2020-11-19 ENCOUNTER — Other Ambulatory Visit (HOSPITAL_COMMUNITY): Payer: Self-pay | Admitting: Internal Medicine

## 2020-11-19 MED FILL — GABAPENTIN 400 MG CAPSULE: 400 | 90 days supply | Qty: 270 | Fill #0

## 2020-11-19 MED FILL — metroNIDAZOLE 1 % GEL: 1 | 14 days supply | Qty: 60 | Fill #1

## 2020-11-19 MED FILL — ROSUVASTATIN CALCIUM 20 MG: 20 | 90 days supply | Qty: 90 | Fill #1

## 2020-12-05 ENCOUNTER — Encounter: Payer: Self-pay | Admitting: Family Medicine

## 2020-12-06 MED FILL — DULoxetine HCL 20 MG CPEP: 20 | 30 days supply | Qty: 30 | Fill #1

## 2020-12-19 ENCOUNTER — Encounter: Payer: Self-pay | Admitting: Family Medicine

## 2020-12-19 ENCOUNTER — Other Ambulatory Visit: Payer: Self-pay

## 2020-12-19 ENCOUNTER — Other Ambulatory Visit: Payer: Self-pay | Admitting: Family Medicine

## 2020-12-19 ENCOUNTER — Ambulatory Visit: Payer: PPO | Admitting: Family Medicine

## 2020-12-19 DIAGNOSIS — M5416 Radiculopathy, lumbar region: Secondary | ICD-10-CM | POA: Diagnosis not present

## 2020-12-19 MED ORDER — DULOXETINE HCL 20 MG PO CPEP: 20.0000 mg | ORAL_CAPSULE | Freq: Every day | ORAL | 0 refills | Status: DC

## 2020-12-19 NOTE — Progress Notes (Signed)
[place] Phone: [phone] Subjective:   [name], am serving as a scribe for Dr. Hulan Saas. This visit occurred during the SARS-CoV-2 public health emergency.  Safety protocols were in place, including screening questions prior to the visit, additional usage of staff PPE, and extensive cleaning of exam room while observing appropriate contact time as indicated for disinfecting solutions.   I'm seeing this patient by the request  of:  [name], MD  CC: Low back pain and knee pain follow-up  [id number]   11/07/2020 Severe overall.  We will need to continue to monitor.  Continue vitamin D supplementation.  Likely arthritic changes.  Patient does have some atrophy of the legs that could be secondary to more of the back and the nerve aspect.  We discussed with patient and she wants to start with home exercises.  We discussed the VMO strengthening and such things as biking that would be beneficial.  Discussed icing regimen.  Discussed the potential for injections at follow-up in following up again in 6 weeks  Patient continues to have more of a lumbar radiculopathy.  We discussed with patient in great length.  Patient is on gabapentin but I am concerned with increasing the dose anymore at this time.  Patient is taking 400 mg.  We discussed the potential for daytime dosing.  Patient at this point will be started on a very low dose of Cymbalta.  Patient could potentially respond well hopefully.  We discussed potential side effects.  Encourage patient to continue with the vitamin D supplementation.  If patient continues to have pain we can consider the possibility of an epidural again or we need to discuss again with neurosurgery for surgical intervention.  Follow-up again in 6 weeks  Update 12/19/2020 Debbra A Martinique is a 70 y.o. female coming in with complaint of back and knee pain. Patient states that she was  doing well until yesterday. Patient shoveled yesterday and aggravated her back. Knee pain has improved.  Patient feels that the topical Voltaren over-the-counter as well as the new Cymbalta is doing very well.  Patient states that this is the best she has felt in quite some time.  Noticing that most of her aches and pains are significantly decreased.  No radicular symptoms at this point with the back until yesterday.  Patient is very happy with the results.  No side effects to the Cymbalta at all.     Past Medical History:  Diagnosis Date  . CAD (coronary artery disease)   . Closed hip fracture ([place]) 03/2016   RT HIP  . Hypertension   . Osteopenia   . Osteoporosis    h/o  . Pneumonia   . Skin cancer    multiple skin cancers   Past Surgical History:  Procedure Laterality Date  . CALDWELL LUC    . COLONOSCOPY WITH PROPOFOL N/A 03/22/2014   Procedure: COLONOSCOPY WITH PROPOFOL;  Surgeon: Arta Silence, MD;  Location: [facility];  Service: Endoscopy;  Laterality: N/A;  . HIP PINNING,CANNULATED Right 03/21/2016   Procedure: CANNULATED HIP PINNING;  Surgeon: Rod Can, MD;  Location: [facility];  Service: Orthopedics;  Laterality: Right;  . HIP SURGERY    . LUMBAR LAMINECTOMY/DECOMPRESSION MICRODISCECTOMY Left 02/14/2019   Procedure: Laminectomy and Foraminotomy - L5-S1 - left;  Surgeon: Eustace Moore, MD;  Location: [facility];  Service: Neurosurgery;  Laterality: Left;  Laminectomy and Foraminotomy - L5-S1 - left  .  MOHS SURGERY    . NASAL SEPTUM SURGERY     past hx. deviated septum  . TONSILLECTOMY     Social History   Socioeconomic History  . Marital status: Married    Spouse name: [name]  . Number of children: 1  . Years of education: Not on file  . Highest education level: Not on file  Occupational History  . Occupation: MED TECH    Employer: [employer]  Tobacco Use  . Smoking status: Never Smoker  . Smokeless tobacco: Never Used  Vaping Use  . Vaping Use: Never used   Substance and Sexual Activity  . Alcohol use: Yes    Alcohol/week: 0.3 standard drinks    Comment: Rare  . Drug use: No  . Sexual activity: Yes    Partners: Male    Comment: married, [place]  Other Topics Concern  . Not on file  Social History Narrative  . Not on file   Social Determinants of Health   Financial Resource Strain: Not on file  Food Insecurity: Not on file  Transportation Needs: Not on file  Physical Activity: Not on file  Stress: Not on file  Social Connections: Not on file   Allergies  Allergen Reactions  . Cefaclor Hives  . Adhesive [Tape]   . Fosamax [Alendronate Sodium] Nausea Only    Gi upset  . Neomycin-Bacitracin Zn-Polymyx Rash   Family History  Problem Relation Age of Onset  . Hypertension Mother   . Leukemia Father   . Hypertension Father   . Diabetes Brother   . Hypertension Brother   . Hyperlipidemia Maternal Grandmother   . Cancer Maternal Grandfather   . Heart disease Paternal Grandfather   . Stroke Paternal Grandfather   . Breast cancer Maternal Aunt   . Breast cancer Paternal Aunt     Current Outpatient Medications (Endocrine & Metabolic):  Marland Kitchen  Zoledronic Acid (RECLAST IV), Inject 1 Dose into the vein once. Once a year  Current Outpatient Medications (Cardiovascular):  .  amLODipine (NORVASC) 5 MG tablet, Take 5 mg by mouth every evening. .  rosuvastatin (CRESTOR) 20 MG tablet, Take 1 tablet (20 mg total) by mouth daily.   Current Outpatient Medications (Analgesics):  .  acetaminophen (TYLENOL) 500 MG tablet, Take 1,000 mg by mouth every 6 (six) hours as needed (for pain.). Marland Kitchen  aspirin EC 81 MG tablet, Take 81 mg by mouth every evening.  Current Outpatient Medications (Hematological):  .  folic acid (FOLVITE) 1 MG tablet, Take 1 mg by mouth daily.  Current Outpatient Medications (Other):  .  b complex vitamins tablet, Take 1 tablet by mouth daily. .  Biotin 1 MG CAPS, Take 1 capsule by mouth daily.  .  cholecalciferol  (VITAMIN D) 25 MCG (1000 UT) tablet, Take 1,000 Units by mouth daily. .  DULoxetine (CYMBALTA) 20 MG capsule, Take 1 capsule (20 mg total) by mouth daily. .  DULoxetine (CYMBALTA) 20 MG capsule, Take 1 capsule (20 mg total) by mouth daily. Marland Kitchen  estradiol (ESTRACE) 0.1 MG/GM vaginal cream, INSERT 1/2 APPLICATORFULS VAGINALLY 2 (TWO) TIMES A WEEK. [place] .  fish oil-omega-3 fatty acids 1000 MG capsule, Take 1,000 mg by mouth 2 (two) times daily.  Marland Kitchen  gabapentin (NEURONTIN) 400 MG capsule, Take 400 mg by mouth at bedtime. .  methocarbamol (ROBAXIN) 500 MG tablet, Take 1 tablet (500 mg total) by mouth every 8 (eight) hours as needed for muscle spasms. .  metroNIDAZOLE (METROGEL) 1 %  gel, Apply 1 application topically 2 (two) times daily. Applied to face .  Multiple Vitamin (MULTIVITAMIN WITH MINERALS) TABS tablet, Take 1 tablet by mouth daily. Vladimir Faster Glycol-Propyl Glycol (SYSTANE OP), Place 1 drop into both eyes 2 (two) times daily as needed (Dry/Irritated eyes). .  psyllium (METAMUCIL) 58.6 % packet, Take 1 packet by mouth daily.   Reviewed prior external information including notes and imaging from  primary care provider As well as notes that were available from care everywhere and other healthcare systems.  Past medical history, social, surgical and family history all reviewed in electronic medical record.  No pertanent information unless stated regarding to the chief complaint.   Review of Systems:  No headache, visual changes, nausea, vomiting, diarrhea, constipation, dizziness, abdominal pain, skin rash, fevers, chills, night sweats, weight loss, swollen lymph nodes, body aches, joint swelling, chest pain, shortness of breath, mood changes. POSITIVE muscle aches but improved from previous exam  Objective  Blood pressure 140/80, pulse 82, height 5\' 5"  (1.651 m), weight 108 lb (49 kg), SpO2 98 %.   General: No apparent distress alert and oriented x3 mood and affect normal,  dressed appropriately.  HEENT: Pupils equal, extraocular movements intact  Respiratory: Patient's speak in full sentences and does not appear short of breath  Patient does have arthritic changes of multiple joints  Low back exam does have loss of lordosis.  Still lacks last 5 degrees of flexion and extension of the back.  Patient does have negative Corky Sox.  Does have some tightness with straight leg test left greater than right.  Neurovascularly intact distally.    Impression and Recommendations:    The above documentation has been reviewed and is accurate and complete [name], DO

## 2020-12-19 NOTE — Assessment & Plan Note (Signed)
Patient is doing significantly better with patient is on the low dose of his Cymbalta.  Discussed with patient about the possibility of other medications decreasing.  We discussed potentially decreasing the gabapentin as well as the methocarbamol.  Patient is only using those at night as needed.  Patient states that she does have a follow-up with her primary care and will have labs done soon.  Patient will increase activity and follow-up with me again in 3 months

## 2020-12-19 NOTE — Patient Instructions (Signed)
Great to see you Find someone in neighborhood to do shoveling Cymbalta refilled See me in 3 months

## 2021-01-07 MED FILL — FOLIC ACID 1 MG TABS: 1 | 90 days supply | Qty: 90 | Fill #1

## 2021-01-07 MED FILL — METHOCARBAMOL 500 MG TABS: 500 | 30 days supply | Qty: 90 | Fill #1

## 2021-01-07 MED FILL — AMLODIPINE BESYLATE 5 MG TA: 5 | 90 days supply | Qty: 90 | Fill #1

## 2021-01-07 MED FILL — DULoxetine HCL 20 MG CPEP: 20 | 90 days supply | Qty: 90 | Fill #0

## 2021-01-15 DIAGNOSIS — L821 Other seborrheic keratosis: Secondary | ICD-10-CM | POA: Diagnosis not present

## 2021-01-15 DIAGNOSIS — D1801 Hemangioma of skin and subcutaneous tissue: Secondary | ICD-10-CM | POA: Diagnosis not present

## 2021-01-15 DIAGNOSIS — D485 Neoplasm of uncertain behavior of skin: Secondary | ICD-10-CM | POA: Diagnosis not present

## 2021-01-15 DIAGNOSIS — D225 Melanocytic nevi of trunk: Secondary | ICD-10-CM | POA: Diagnosis not present

## 2021-01-15 DIAGNOSIS — Z85820 Personal history of malignant melanoma of skin: Secondary | ICD-10-CM | POA: Diagnosis not present

## 2021-01-15 DIAGNOSIS — C44719 Basal cell carcinoma of skin of left lower limb, including hip: Secondary | ICD-10-CM | POA: Diagnosis not present

## 2021-01-15 DIAGNOSIS — D2272 Melanocytic nevi of left lower limb, including hip: Secondary | ICD-10-CM | POA: Diagnosis not present

## 2021-01-15 DIAGNOSIS — D2262 Melanocytic nevi of left upper limb, including shoulder: Secondary | ICD-10-CM | POA: Diagnosis not present

## 2021-01-15 DIAGNOSIS — Z85828 Personal history of other malignant neoplasm of skin: Secondary | ICD-10-CM | POA: Diagnosis not present

## 2021-01-23 DIAGNOSIS — H26491 Other secondary cataract, right eye: Secondary | ICD-10-CM | POA: Diagnosis not present

## 2021-01-23 DIAGNOSIS — H52203 Unspecified astigmatism, bilateral: Secondary | ICD-10-CM | POA: Diagnosis not present

## 2021-01-23 DIAGNOSIS — Z961 Presence of intraocular lens: Secondary | ICD-10-CM | POA: Diagnosis not present

## 2021-01-29 DIAGNOSIS — Z8781 Personal history of (healed) traumatic fracture: Secondary | ICD-10-CM | POA: Diagnosis not present

## 2021-01-29 DIAGNOSIS — M810 Age-related osteoporosis without current pathological fracture: Secondary | ICD-10-CM | POA: Diagnosis not present

## 2021-01-29 DIAGNOSIS — R636 Underweight: Secondary | ICD-10-CM | POA: Diagnosis not present

## 2021-02-11 ENCOUNTER — Other Ambulatory Visit (HOSPITAL_BASED_OUTPATIENT_CLINIC_OR_DEPARTMENT_OTHER): Payer: Self-pay

## 2021-02-12 ENCOUNTER — Other Ambulatory Visit (HOSPITAL_BASED_OUTPATIENT_CLINIC_OR_DEPARTMENT_OTHER): Payer: Self-pay

## 2021-02-14 ENCOUNTER — Other Ambulatory Visit (HOSPITAL_BASED_OUTPATIENT_CLINIC_OR_DEPARTMENT_OTHER): Payer: Self-pay

## 2021-02-19 ENCOUNTER — Other Ambulatory Visit (HOSPITAL_BASED_OUTPATIENT_CLINIC_OR_DEPARTMENT_OTHER): Payer: Self-pay

## 2021-02-25 ENCOUNTER — Other Ambulatory Visit (HOSPITAL_COMMUNITY): Payer: Self-pay

## 2021-02-26 ENCOUNTER — Other Ambulatory Visit: Payer: Self-pay

## 2021-02-26 ENCOUNTER — Ambulatory Visit (HOSPITAL_COMMUNITY)
Admission: RE | Admit: 2021-02-26 | Discharge: 2021-02-26 | Disposition: A | Discharge status: Home / Self Care | Payer: PPO | Source: Ambulatory Visit | Attending: Internal Medicine | Admitting: Internal Medicine

## 2021-02-26 DIAGNOSIS — M810 Age-related osteoporosis without current pathological fracture: Secondary | ICD-10-CM | POA: Diagnosis not present

## 2021-02-26 MED ORDER — ZOLEDRONIC ACID 5 MG/100ML IV SOLN
INTRAVENOUS | Status: AC
  Administered 2021-02-26: 5 mg via INTRAVENOUS
  Filled 2021-02-26: qty 100

## 2021-02-26 MED ORDER — ZOLEDRONIC ACID 5 MG/100ML IV SOLN: 5.0000 mg | Freq: Once | INTRAVENOUS | Status: AC

## 2021-03-14 ENCOUNTER — Other Ambulatory Visit (HOSPITAL_BASED_OUTPATIENT_CLINIC_OR_DEPARTMENT_OTHER): Payer: Self-pay

## 2021-03-14 MED FILL — Estradiol Vaginal Cream 0.1 MG/GM: VAGINAL | 30 days supply | Qty: 42.5 | Fill #0 | Status: CN

## 2021-03-22 NOTE — Progress Notes (Signed)
[facility] [address] [place] Phone: [phone] Subjective:   I Tracey [name] am serving as a Education administrator for Tracey [name].  This visit occurred during the SARS-CoV-2 public health emergency.  Safety protocols were in place, including screening questions prior to the visit, additional usage of staff PPE, and extensive cleaning of exam room while observing appropriate contact time as indicated for disinfecting solutions.   I'm seeing this patient by the request  of:  Tracey Carol, MD  CC: Low back pain follow-up  [id number]   12/19/2020 Patient is doing significantly better with patient is on the low dose of his Cymbalta.  Discussed with patient about the possibility of other medications decreasing.  We discussed potentially decreasing the gabapentin as well as the methocarbamol.  Patient is only using those at night as needed.  Patient states that she does have a follow-up with her primary care and will have labs done soon.  Patient will increase activity and follow-up with me again in 3 months  Update 03/25/2021 Tracey [name] is a 70 y.o. female coming in with complaint of back pain.  Patient at last exam was doing better with the low-dose of Cymbalta.  Patient was to consider the possibility of decreasing the gabapentin and the methocarbamol. Patient states her back has been doing well. Past 2 weeks have been difficult.      Past Medical History:  Diagnosis Date  . CAD (coronary artery disease)   . Closed hip fracture ([place]) 03/2016   RT HIP  . Hypertension   . Osteopenia   . Osteoporosis    h/o  . Pneumonia   . Skin cancer    multiple skin cancers   Past Surgical History:  Procedure Laterality Date  . CALDWELL LUC    . COLONOSCOPY WITH PROPOFOL N/A 03/22/2014   Procedure: COLONOSCOPY WITH PROPOFOL;  Surgeon: Arta Silence, MD;  [facility];  Service: Endoscopy;  Laterality: N/A;  . HIP PINNING,CANNULATED  Right 03/21/2016   Procedure: CANNULATED HIP PINNING;  Surgeon: Rod Can, MD;  Location: [facility];  Service: Orthopedics;  Laterality: Right;  . HIP SURGERY    . LUMBAR LAMINECTOMY/DECOMPRESSION MICRODISCECTOMY Left 02/14/2019   Procedure: Laminectomy and Foraminotomy - L5-S1 - left;  Surgeon: Eustace Moore, MD;  Location: [facility];  Service: Neurosurgery;  Laterality: Left;  Laminectomy and Foraminotomy - L5-S1 - left  . MOHS SURGERY    . NASAL SEPTUM SURGERY     past hx. deviated septum  . TONSILLECTOMY     Social History   Socioeconomic History  . Marital status: Married    Spouse name: Tracey [name]  . Number of children: 1  . Years of education: Not on file  . Highest education level: Not on file  Occupational History  . Occupation: MED TECH    Employer: [employer]  Tobacco Use  . Smoking status: Never Smoker  . Smokeless tobacco: Never Used  Vaping Use  . Vaping Use: Never used  Substance and Sexual Activity  . Alcohol use: Yes    Alcohol/week: 0.3 standard drinks    Comment: Rare  . Drug use: No  . Sexual activity: Yes    Partners: Male    Comment: married, [place]  Other Topics Concern  . Not on file  Social History Narrative  . Not on file   Social Determinants of Health   Financial Resource Strain: Not on file  Food Insecurity: Not on  file  Transportation Needs: Not on file  Physical Activity: Not on file  Stress: Not on file  Social Connections: Not on file   Allergies  Allergen Reactions  . Cefaclor Hives  . Adhesive [Tape]   . Fosamax [Alendronate Sodium] Nausea Only    Gi upset  . Neomycin-Bacitracin Zn-Polymyx Rash   Family History  Problem Relation Age of Onset  . Hypertension Mother   . Leukemia Father   . Hypertension Father   . Diabetes Brother   . Hypertension Brother   . Hyperlipidemia Maternal Grandmother   . Cancer Maternal Grandfather   . Heart disease Paternal Grandfather   . Stroke Paternal Grandfather   . Breast cancer  Maternal Aunt   . Breast cancer Paternal Aunt     Current Outpatient Medications (Endocrine & Metabolic):  Marland Kitchen  Zoledronic Acid (RECLAST IV), Inject 1 Dose into the vein once. Once a year  Current Outpatient Medications (Cardiovascular):  .  amLODipine (NORVASC) 5 MG tablet, Take 5 mg by mouth every evening. Marland Kitchen  amLODipine (NORVASC) 5 MG tablet, TAKE 1 TABLET BY MOUTH ONCE A DAY .  rosuvastatin (CRESTOR) 20 MG tablet, TAKE 1 TABLET (20 MG TOTAL) BY MOUTH DAILY.   Current Outpatient Medications (Analgesics):  .  acetaminophen (TYLENOL) 500 MG tablet, Take 1,000 mg by mouth every 6 (six) hours as needed (for pain.). Marland Kitchen  aspirin EC 81 MG tablet, Take 81 mg by mouth every evening.  Current Outpatient Medications (Hematological):  .  folic acid (FOLVITE) 1 MG tablet, Take 1 mg by mouth daily. .  folic acid (FOLVITE) 1 MG tablet, TAKE 1 TABLET BY MOUTH ONCE A DAY  Current Outpatient Medications (Other):  .  b complex vitamins tablet, Take 1 tablet by mouth daily. .  Biotin 1 MG CAPS, Take 1 capsule by mouth daily.  .  cholecalciferol (VITAMIN D) 25 MCG (1000 UT) tablet, Take 1,000 Units by mouth daily. Marland Kitchen  doxycycline (VIBRAMYCIN) 100 MG capsule, TAKE 1 CAPSULE BY MOUTH TWICE A DAY; WITH FOOD .  DULoxetine (CYMBALTA) 20 MG capsule, TAKE 1 CAPSULE (20 MG TOTAL) BY MOUTH DAILY. .  DULoxetine (CYMBALTA) 20 MG capsule, TAKE 1 CAPSULE (20 MG TOTAL) BY MOUTH DAILY. Marland Kitchen  estradiol (ESTRACE) 0.1 MG/GM vaginal cream, INSERT 1/2 APPLICATORFUL VAGINALLY 2 (TWO) TIMES A WEEK. [place] .  fish oil-omega-3 fatty acids 1000 MG capsule, Take 1,000 mg by mouth 2 (two) times daily.  Marland Kitchen  gabapentin (NEURONTIN) 400 MG capsule, Take 400 mg by mouth at bedtime. .  gabapentin (NEURONTIN) 400 MG capsule, TAKE 1 CAPSULE BY MOUTH 3 TIMES DAILY AS DIRECTED .  methocarbamol (ROBAXIN) 500 MG tablet, TAKE 1 TABLET (500 MG TOTAL) BY MOUTH EVERY 8 (EIGHT) HOURS AS NEEDED FOR MUSCLE SPASMS. Marland Kitchen  metroNIDAZOLE (METROGEL)  1 % gel, Apply 1 application topically 2 (two) times daily. Applied to face .  Multiple Vitamin (MULTIVITAMIN WITH MINERALS) TABS tablet, Take 1 tablet by mouth daily. Vladimir Faster Glycol-Propyl Glycol (SYSTANE OP), Place 1 drop into both eyes 2 (two) times daily as needed (Dry/Irritated eyes). .  psyllium (METAMUCIL) 58.6 % packet, Take 1 packet by mouth daily.   Reviewed prior external information including notes and imaging from  primary care provider As well as notes that were available from care everywhere and other healthcare systems.  Past medical history, social, surgical and family history all reviewed in electronic medical record.  No pertanent information unless stated regarding to the chief complaint.  Review of Systems:  No headache, visual changes, nausea, vomiting, diarrhea, constipation, dizziness, abdominal pain, skin rash, fevers, chills, night sweats, weight loss, swollen lymph nodes, body aches, joint swelling, chest pain, shortness of breath, mood changes. POSITIVE muscle aches  Objective  Blood pressure 140/80, pulse 78, height 5\' 5"  (1.651 m), weight 105 lb (47.6 kg), SpO2 100 %.   General: No apparent distress alert and oriented x3 mood and affect normal, dressed appropriately.  HEENT: Pupils equal, extraocular movements intact  Respiratory: Patient's speak in full sentences and does not appear short of breath  Cardiovascular: No lower extremity edema, non tender, no erythema  Gait normal with good balance and coordination.  MSK: Patient is having increased discomfort in the area.  Positive weakness.  5 degrees of extension less than 10 degrees of flexion.  Doing significantly better.  Mild tightness with FABER test on the right.  Osteopathic findings T7 extended that did not present. L3 flexed rotated and side bent left Sacrum     Impression and Recommendations:     The above documentation has been reviewed and is accurate and complete [name],  DO

## 2021-03-25 ENCOUNTER — Other Ambulatory Visit: Payer: Self-pay

## 2021-03-25 ENCOUNTER — Encounter: Payer: Self-pay | Admitting: Family Medicine

## 2021-03-25 ENCOUNTER — Ambulatory Visit: Payer: PPO | Admitting: Family Medicine

## 2021-03-25 VITALS — BP 140/80 | HR 78 | Ht 65.0 in | Wt 105.0 lb

## 2021-03-25 DIAGNOSIS — M9904 Segmental and somatic dysfunction of sacral region: Secondary | ICD-10-CM | POA: Diagnosis not present

## 2021-03-25 DIAGNOSIS — M9903 Segmental and somatic dysfunction of lumbar region: Secondary | ICD-10-CM | POA: Diagnosis not present

## 2021-03-25 DIAGNOSIS — M5416 Radiculopathy, lumbar region: Secondary | ICD-10-CM

## 2021-03-25 DIAGNOSIS — M9902 Segmental and somatic dysfunction of thoracic region: Secondary | ICD-10-CM | POA: Diagnosis not present

## 2021-03-25 NOTE — Assessment & Plan Note (Signed)
Patient likely is not having any true radicular symptoms.  Discussed with patient again at great length.  Patient has had difficulty with her back previously and we have been concerned with patient's osteoporosis and doing too much manipulation.  Discussed with patient who did not feel like she wanted to take a chance with more of the muscle energy.  Responded relatively well.  Discussed posture and ergonomics.  Continue the Cymbalta.  Follow-up with me again in  2 to 3 months

## 2021-03-25 NOTE — Patient Instructions (Signed)
Good to see you Tried light manipulation hopefully it helps Continue the exercises Stay hydrated See me again in 2-3 months

## 2021-04-08 DIAGNOSIS — C44612 Basal cell carcinoma of skin of right upper limb, including shoulder: Secondary | ICD-10-CM | POA: Diagnosis not present

## 2021-04-08 DIAGNOSIS — D2261 Melanocytic nevi of right upper limb, including shoulder: Secondary | ICD-10-CM | POA: Diagnosis not present

## 2021-04-08 DIAGNOSIS — Z85828 Personal history of other malignant neoplasm of skin: Secondary | ICD-10-CM | POA: Diagnosis not present

## 2021-04-08 DIAGNOSIS — D0472 Carcinoma in situ of skin of left lower limb, including hip: Secondary | ICD-10-CM | POA: Diagnosis not present

## 2021-04-08 DIAGNOSIS — C44311 Basal cell carcinoma of skin of nose: Secondary | ICD-10-CM | POA: Diagnosis not present

## 2021-04-08 DIAGNOSIS — L821 Other seborrheic keratosis: Secondary | ICD-10-CM | POA: Diagnosis not present

## 2021-04-08 DIAGNOSIS — L570 Actinic keratosis: Secondary | ICD-10-CM | POA: Diagnosis not present

## 2021-04-08 DIAGNOSIS — Z85820 Personal history of malignant melanoma of skin: Secondary | ICD-10-CM | POA: Diagnosis not present

## 2021-04-08 DIAGNOSIS — D225 Melanocytic nevi of trunk: Secondary | ICD-10-CM | POA: Diagnosis not present

## 2021-04-09 ENCOUNTER — Other Ambulatory Visit: Payer: Self-pay | Admitting: Family Medicine

## 2021-04-09 ENCOUNTER — Other Ambulatory Visit (HOSPITAL_COMMUNITY): Payer: Self-pay

## 2021-04-09 MED ORDER — METHOCARBAMOL 500 MG PO TABS
500.0000 mg | ORAL_TABLET | Freq: Three times a day (TID) | ORAL | 1 refills | Status: DC | PRN
  Filled 2021-04-09 – 2021-07-23 (×2): qty 90, 30d supply, fill #0

## 2021-04-09 MED ORDER — DULOXETINE HCL 20 MG PO CPEP
20.0000 mg | ORAL_CAPSULE | Freq: Every day | ORAL | 0 refills | Status: DC
Start: 2021-04-09 — End: 2021-07-06
  Filled 2021-04-09: qty 90, 90d supply, fill #0

## 2021-04-09 MED FILL — Folic Acid Tab 1 MG: ORAL | 90 days supply | Qty: 90 | Fill #0 | Status: AC

## 2021-04-09 MED FILL — Amlodipine Besylate Tab 5 MG (Base Equivalent): ORAL | 90 days supply | Qty: 90 | Fill #0 | Status: AC

## 2021-04-16 ENCOUNTER — Telehealth: Payer: Self-pay | Admitting: Interventional Cardiology

## 2021-04-16 DIAGNOSIS — I10 Essential (primary) hypertension: Secondary | ICD-10-CM

## 2021-04-16 DIAGNOSIS — E785 Hyperlipidemia, unspecified: Secondary | ICD-10-CM

## 2021-04-16 DIAGNOSIS — I493 Ventricular premature depolarization: Secondary | ICD-10-CM

## 2021-04-16 NOTE — Telephone Encounter (Signed)
Patient c/o Palpitations:  High priority if patient c/o lightheadedness, shortness of breath, or chest pain  1) How long have you had palpitations/irregular HR/ Afib? Are you having the symptoms now?  Patient states she has been having palpitations on and off with exertion for the past 2-3 months. No symptoms currently. (patient denies it being palpitations, but she states she can feel her heart pounding)   2) Are you currently experiencing lightheadedness, SOB or CP?  No   3) Do you have a history of afib (atrial fibrillation) or irregular heart rhythm?  No    4) Have you checked your BP or HR? (document readings if available):  No   5) Are you experiencing any other symptoms?  No

## 2021-04-16 NOTE — Telephone Encounter (Signed)
Pt states she wouldn't describe what she is feeling as palpitations, more of a pounding.  [place] and when she has to climb the 33-45 steps, she feels her heart pounding and becomes SOB.  States this has worsened since last year.  Denies CP.  Occasionally gets dizzy with these episodes.  Takes 5-10 mins for symptoms to resolve once she stops to rest.  Doesn't check HR at home but was 76 at PCPs office at the end of April.  Advised I will send message to Dr. Tamala Julian for review.  I did mention possibility of a heart monitor.

## 2021-04-17 ENCOUNTER — Encounter: Payer: Self-pay | Admitting: *Deleted

## 2021-04-17 NOTE — Telephone Encounter (Signed)
Spoke with pt and made her aware of recommendations.  Pt agreeable to plan.  Advised I will send a MyChart message with the instructions for the test.  Pt appreciative for call.

## 2021-04-17 NOTE — Telephone Encounter (Signed)
ETT to replicate symptoms.

## 2021-04-25 ENCOUNTER — Ambulatory Visit (INDEPENDENT_AMBULATORY_CARE_PROVIDER_SITE_OTHER): Payer: PPO

## 2021-04-25 ENCOUNTER — Other Ambulatory Visit: Payer: Self-pay

## 2021-04-25 DIAGNOSIS — I10 Essential (primary) hypertension: Secondary | ICD-10-CM | POA: Diagnosis not present

## 2021-04-25 DIAGNOSIS — E785 Hyperlipidemia, unspecified: Secondary | ICD-10-CM

## 2021-04-25 DIAGNOSIS — I493 Ventricular premature depolarization: Secondary | ICD-10-CM | POA: Diagnosis not present

## 2021-04-25 LAB — EXERCISE TOLERANCE TEST
Estimated workload: 7 METS
Exercise duration (min): 6 min
Exercise duration (sec): 0 s
MPHR: 149 {beats}/min
Peak HR: 136 {beats}/min
Percent HR: 91 %
RPE: 15
Rest HR: 67 {beats}/min

## 2021-04-26 NOTE — Progress Notes (Signed)
Pt has been made aware of normal result and verbalized understanding.  jw  Pt did state that she did think it may be conditioning.  She said it didn't feel the same on the treadmill. She stated that while she was on the treadmill, she had no problems at all.  She is aware that we will observe for now.

## 2021-05-13 ENCOUNTER — Other Ambulatory Visit (HOSPITAL_BASED_OUTPATIENT_CLINIC_OR_DEPARTMENT_OTHER): Payer: Self-pay

## 2021-05-13 ENCOUNTER — Other Ambulatory Visit (HOSPITAL_COMMUNITY): Payer: Self-pay

## 2021-05-13 DIAGNOSIS — Z85820 Personal history of malignant melanoma of skin: Secondary | ICD-10-CM | POA: Diagnosis not present

## 2021-05-13 DIAGNOSIS — Z85828 Personal history of other malignant neoplasm of skin: Secondary | ICD-10-CM | POA: Diagnosis not present

## 2021-05-13 DIAGNOSIS — C44311 Basal cell carcinoma of skin of nose: Secondary | ICD-10-CM | POA: Diagnosis not present

## 2021-05-13 MED ORDER — DOXYCYCLINE HYCLATE 100 MG PO CAPS
ORAL_CAPSULE | ORAL | 0 refills | Status: DC
Start: 2021-05-13 — End: 2022-01-14
  Filled 2021-05-13: qty 10, 5d supply, fill #0

## 2021-05-13 MED ORDER — DOXYCYCLINE HYCLATE 100 MG PO CAPS
100.0000 mg | ORAL_CAPSULE | Freq: Two times a day (BID) | ORAL | 0 refills | Status: DC
Start: 2021-05-13 — End: 2021-05-13
  Filled 2021-05-13: qty 10, 5d supply, fill #0

## 2021-05-13 MED FILL — Rosuvastatin Calcium Tab 20 MG: ORAL | 90 days supply | Qty: 90 | Fill #0 | Status: AC

## 2021-05-13 MED FILL — Estradiol Vaginal Cream 0.1 MG/GM: VAGINAL | 90 days supply | Qty: 42.5 | Fill #0 | Status: AC

## 2021-05-13 MED FILL — Rosuvastatin Calcium Tab 20 MG: ORAL | 90 days supply | Qty: 90 | Fill #0 | Status: CN

## 2021-05-24 NOTE — Progress Notes (Signed)
[facility] [address] [place] Phone: [phone] Subjective:   I Tracey [name] am serving as a Education administrator for Dr. Hulan Saas.  This visit occurred during the SARS-CoV-2 public health emergency.  Safety protocols were in place, including screening questions prior to the visit, additional usage of staff PPE, and extensive cleaning of exam room while observing appropriate contact time as indicated for disinfecting solutions.   I'm seeing this patient by the request  of:  [name], MD  CC: Low back pain follow-up  [id number]  Doneta A Martinique is a 70 y.o. female coming in with complaint of back and neck pain. OMT 03/25/2021. Patient states she is doing well.   Medications patient has been prescribed: Cymbalta, Robaxin  Taking:         Reviewed prior external information including notes and imaging from previsou exam, outside providers and external EMR if available.   As well as notes that were available from care everywhere and other healthcare systems.  Past medical history, social, surgical and family history all reviewed in electronic medical record.  No pertanent information unless stated regarding to the chief complaint.   Past Medical History:  Diagnosis Date   CAD (coronary artery disease)    Closed hip fracture ([place]) 03/2016   RT HIP   Hypertension    Osteopenia    Osteoporosis    h/o   Pneumonia    Skin cancer    multiple skin cancers    Allergies  Allergen Reactions   Cefaclor Hives   Adhesive [Tape]    Fosamax [Alendronate Sodium] Nausea Only    Gi upset   Neomycin-Bacitracin Zn-Polymyx Rash     Review of Systems:  No headache, visual changes, nausea, vomiting, diarrhea, constipation, dizziness, abdominal pain, skin rash, fevers, chills, night sweats, weight loss, swollen lymph nodes, body aches, joint swelling, chest pain, shortness of breath, mood changes. POSITIVE muscle aches, dry  mouth  Objective  Blood pressure 120/82, pulse 91, height 5\' 5"  (1.651 m), weight 101 lb (45.8 kg), SpO2 99 %.   General: No apparent distress alert and oriented x3 mood and affect normal, dressed appropriately.  HEENT: Pupils equal, extraocular movements intact  Respiratory: Patient's speak in full sentences and does not appear short of breath patient does have some mild hoarseness noted. Cardiovascular: No lower extremity edema, non tender, no erythema   Gait normal with good balance and coordination.  MSK:  Non tender with full range of motion and good stability and symmetric strength and tone of shoulders, elbows, wrist, hip, knee and ankles bilaterally.  Back - Normal skin, Spine with normal alignment and no deformity.  No tenderness to vertebral proces low back exam does have some mild loss of lordosis.  This is mild tightness noted with flexion and extension.  Patient does have 5 out of 5 strength of the lower extremities.  Neurovascular intact distally.  Mild discomfort in the paraspinal musculature of the lumbar spine.  Osteopathic findings  T6 extended rotated and side bent right L2 flexed rotated and side bent right Sacrum right on right       Assessment and Plan:  Lumbar radiculopathy Likely no radiculopathy at this moment again.  Patient is doing relatively well.  We did do some mild muscle energy but still avoid any of the HVLA.  Continuing the Cymbalta.  Patient has had some dry mouth and does not know if it is from the medication or something else so  we will refer to ENT for further evaluation.  Follow-up with me again in 2 to 3 months  Dry mouth Referred to ENT for further evaluation.   Nonallopathic problems  Decision today to treat with OMT was based on Physical Exam  After verbal consent patient was treated with  ME, techniques in thoracic, lumbar, and sacral  areas  Patient tolerated the procedure well with improvement in symptoms  Patient given exercises,  stretches and lifestyle modifications  See medications in patient instructions if given  Patient will follow up in 8-12 weeks      The above documentation has been reviewed and is accurate and complete [name], DO        Note: This dictation was prepared with Dragon dictation along with smaller phrase technology. Any transcriptional errors that result from this process are unintentional.

## 2021-05-27 ENCOUNTER — Ambulatory Visit: Payer: PPO | Admitting: Family Medicine

## 2021-05-27 ENCOUNTER — Other Ambulatory Visit: Payer: Self-pay

## 2021-05-27 ENCOUNTER — Encounter: Payer: Self-pay | Admitting: Family Medicine

## 2021-05-27 VITALS — BP 120/82 | HR 91 | Ht 65.0 in | Wt 101.0 lb

## 2021-05-27 DIAGNOSIS — R682 Dry mouth, unspecified: Secondary | ICD-10-CM | POA: Diagnosis not present

## 2021-05-27 DIAGNOSIS — M9903 Segmental and somatic dysfunction of lumbar region: Secondary | ICD-10-CM

## 2021-05-27 DIAGNOSIS — M9904 Segmental and somatic dysfunction of sacral region: Secondary | ICD-10-CM

## 2021-05-27 DIAGNOSIS — M5416 Radiculopathy, lumbar region: Secondary | ICD-10-CM

## 2021-05-27 DIAGNOSIS — R070 Pain in throat: Secondary | ICD-10-CM

## 2021-05-27 DIAGNOSIS — M9902 Segmental and somatic dysfunction of thoracic region: Secondary | ICD-10-CM | POA: Diagnosis not present

## 2021-05-27 NOTE — Assessment & Plan Note (Signed)
Referred to ENT for further evaluation.

## 2021-05-27 NOTE — Assessment & Plan Note (Signed)
Likely no radiculopathy at this moment again.  Patient is doing relatively well.  We did do some mild muscle energy but still avoid any of the HVLA.  Continuing the Cymbalta.  Patient has had some dry mouth and does not know if it is from the medication or something else so we will refer to ENT for further evaluation.  Follow-up with me again in 2 to 3 months

## 2021-05-27 NOTE — Patient Instructions (Addendum)
Good to see you Did a little muscle energy ENT Dr. Redmond Baseman for throat See me again in 2-3 months

## 2021-06-16 ENCOUNTER — Other Ambulatory Visit (HOSPITAL_COMMUNITY): Payer: Self-pay

## 2021-06-17 ENCOUNTER — Other Ambulatory Visit (HOSPITAL_COMMUNITY): Payer: Self-pay

## 2021-06-17 MED ORDER — METRONIDAZOLE 1 % EX GEL
CUTANEOUS | 2 refills | Status: AC
  Filled 2021-06-17 – 2021-06-19 (×3): qty 60, 30d supply, fill #0
  Filled 2021-10-29 – 2021-11-22 (×2): qty 60, 30d supply, fill #1
  Filled 2022-02-26: qty 60, 30d supply, fill #2

## 2021-06-19 ENCOUNTER — Other Ambulatory Visit (HOSPITAL_BASED_OUTPATIENT_CLINIC_OR_DEPARTMENT_OTHER): Payer: Self-pay

## 2021-06-19 ENCOUNTER — Other Ambulatory Visit (HOSPITAL_COMMUNITY): Payer: Self-pay

## 2021-06-20 ENCOUNTER — Other Ambulatory Visit (HOSPITAL_BASED_OUTPATIENT_CLINIC_OR_DEPARTMENT_OTHER): Payer: Self-pay

## 2021-07-06 ENCOUNTER — Other Ambulatory Visit: Payer: Self-pay | Admitting: Family Medicine

## 2021-07-06 MED FILL — Amlodipine Besylate Tab 5 MG (Base Equivalent): ORAL | 90 days supply | Qty: 90 | Fill #0 | Status: AC

## 2021-07-06 MED FILL — Folic Acid Tab 1 MG: ORAL | 90 days supply | Qty: 90 | Fill #0 | Status: AC

## 2021-07-08 ENCOUNTER — Other Ambulatory Visit (HOSPITAL_COMMUNITY): Payer: Self-pay

## 2021-07-08 ENCOUNTER — Other Ambulatory Visit (HOSPITAL_BASED_OUTPATIENT_CLINIC_OR_DEPARTMENT_OTHER): Payer: Self-pay

## 2021-07-08 MED ORDER — DULOXETINE HCL 20 MG PO CPEP
20.0000 mg | ORAL_CAPSULE | Freq: Every day | ORAL | 0 refills | Status: DC
  Filled 2021-07-08 – 2021-07-16 (×2): qty 90, 90d supply, fill #0

## 2021-07-15 ENCOUNTER — Other Ambulatory Visit (HOSPITAL_BASED_OUTPATIENT_CLINIC_OR_DEPARTMENT_OTHER): Payer: Self-pay

## 2021-07-16 ENCOUNTER — Other Ambulatory Visit (HOSPITAL_COMMUNITY): Payer: Self-pay

## 2021-07-16 DIAGNOSIS — I5189 Other ill-defined heart diseases: Secondary | ICD-10-CM | POA: Diagnosis not present

## 2021-07-16 DIAGNOSIS — M810 Age-related osteoporosis without current pathological fracture: Secondary | ICD-10-CM | POA: Diagnosis not present

## 2021-07-16 DIAGNOSIS — E7800 Pure hypercholesterolemia, unspecified: Secondary | ICD-10-CM | POA: Diagnosis not present

## 2021-07-16 DIAGNOSIS — Z0000 Encounter for general adult medical examination without abnormal findings: Secondary | ICD-10-CM | POA: Diagnosis not present

## 2021-07-16 DIAGNOSIS — I10 Essential (primary) hypertension: Secondary | ICD-10-CM | POA: Diagnosis not present

## 2021-07-16 DIAGNOSIS — I2510 Atherosclerotic heart disease of native coronary artery without angina pectoris: Secondary | ICD-10-CM | POA: Diagnosis not present

## 2021-07-16 DIAGNOSIS — N1831 Chronic kidney disease, stage 3a: Secondary | ICD-10-CM | POA: Diagnosis not present

## 2021-07-23 ENCOUNTER — Other Ambulatory Visit (HOSPITAL_BASED_OUTPATIENT_CLINIC_OR_DEPARTMENT_OTHER): Payer: Self-pay

## 2021-07-23 DIAGNOSIS — J3489 Other specified disorders of nose and nasal sinuses: Secondary | ICD-10-CM | POA: Diagnosis not present

## 2021-07-23 DIAGNOSIS — K219 Gastro-esophageal reflux disease without esophagitis: Secondary | ICD-10-CM | POA: Diagnosis not present

## 2021-07-23 MED ORDER — OMEPRAZOLE 40 MG PO CPDR
DELAYED_RELEASE_CAPSULE | ORAL | 5 refills | Status: DC
Start: 2021-07-23 — End: 2022-01-14
  Filled 2021-07-23: qty 60, 30d supply, fill #0
  Filled 2021-08-26: qty 60, 30d supply, fill #1
  Filled 2021-11-11: qty 60, 30d supply, fill #2

## 2021-08-09 ENCOUNTER — Other Ambulatory Visit (HOSPITAL_BASED_OUTPATIENT_CLINIC_OR_DEPARTMENT_OTHER): Payer: Self-pay

## 2021-08-12 ENCOUNTER — Other Ambulatory Visit: Payer: Self-pay | Admitting: Interventional Cardiology

## 2021-08-12 ENCOUNTER — Other Ambulatory Visit (HOSPITAL_BASED_OUTPATIENT_CLINIC_OR_DEPARTMENT_OTHER): Payer: Self-pay

## 2021-08-12 MED FILL — Estradiol Vaginal Cream 0.1 MG/GM: VAGINAL | 90 days supply | Qty: 42.5 | Fill #1 | Status: AC

## 2021-08-13 ENCOUNTER — Other Ambulatory Visit (HOSPITAL_BASED_OUTPATIENT_CLINIC_OR_DEPARTMENT_OTHER): Payer: Self-pay

## 2021-08-13 MED ORDER — ROSUVASTATIN CALCIUM 20 MG PO TABS
20.0000 mg | ORAL_TABLET | Freq: Every day | ORAL | 0 refills | Status: DC
Start: 2021-08-13 — End: 2021-11-11
  Filled 2021-08-13: qty 90, 90d supply, fill #0

## 2021-08-23 NOTE — Progress Notes (Signed)
[place] Phone: [phone] Subjective:   [name], am serving as a scribe for Dr. Hulan Saas. This visit occurred during the SARS-CoV-2 public health emergency.  Safety protocols were in place, including screening questions prior to the visit, additional usage of staff PPE, and extensive cleaning of exam room while observing appropriate contact time as indicated for disinfecting solutions.   I'm seeing this patient by the request  of:  [name], MD  CC: Low back pain  [id number]  Tracey [name] is a 70 y.o. female coming in with complaint of back and neck pain. OMT on 05/27/2021. Patient states that she has been doing ok since last visit. Pain L hamstring.  Patient states that it seems to be very similar to when patient was having radicular symptoms noted.  No weakness noted.  Medications patient has been prescribed: Cymbalta          Past Medical History:  Diagnosis Date   CAD (coronary artery disease)    Closed hip fracture ([place]) 03/2016   RT HIP   Hypertension    Osteopenia    Osteoporosis    h/o   Pneumonia    Skin cancer    multiple skin cancers    Allergies  Allergen Reactions   Cefaclor Hives   Adhesive [Tape]    Fosamax [Alendronate Sodium] Nausea Only    Gi upset   Neomycin-Bacitracin Zn-Polymyx Rash     Review of Systems:  No headache, visual changes, nausea, vomiting, diarrhea, constipation, dizziness, abdominal pain, skin rash, fevers, chills, night sweats, weight loss, swollen lymph nodes, body aches, joint swelling, chest pain, shortness of breath, mood changes. POSITIVE muscle aches  Objective  Blood pressure 128/82, pulse 83, height 5\' 5"  (1.651 m), weight 104 lb (47.2 kg), SpO2 98 %.   General: No apparent distress alert and oriented x3 mood and affect normal, dressed appropriately.  HEENT: Pupils equal, extraocular movements intact  Respiratory:  Patient's speak in full sentences and does not appear short of breath  Cardiovascular: No lower extremity edema, non tender, no erythema  Low back exam does have some loss of lordosis.  Some tenderness to palpation of the paraspinal musculature.  Osteopathic findings   T9 extended rotated and side bent left L2 flexed rotated and side bent right Sacrum right on right       Assessment and Plan:  Lumbar radiculopathy Chronic problem, with radicular symptoms now on the left side, hx of surgery discussed with patient continue home exercises as well as the medications.  Warned patient of different possible side effects to the epidural but I do think at this point we will order another 1.  Patient likely will do okay.  Does have osteoporosis.  Follow-up with me again in 4 to 6 weeks.   Nonallopathic problems  Decision today to treat with OMT was based on Physical Exam  After verbal consent patient was treated with  ME, techniques in  thoracic, lumbar, and sacral  areas  Patient tolerated the procedure well with improvement in symptoms  Patient given exercises, stretches and lifestyle modifications  See medications in patient instructions if given  Patient will follow up in 4-8 weeks      The above documentation has been reviewed and is accurate and complete [name], DO        Note: This dictation was prepared with Dragon dictation along with smaller phrase technology. Any transcriptional errors  that result from this process are unintentional.

## 2021-08-26 ENCOUNTER — Other Ambulatory Visit (HOSPITAL_BASED_OUTPATIENT_CLINIC_OR_DEPARTMENT_OTHER): Payer: Self-pay

## 2021-08-26 ENCOUNTER — Other Ambulatory Visit: Payer: Self-pay

## 2021-08-26 ENCOUNTER — Ambulatory Visit: Payer: PPO | Admitting: Family Medicine

## 2021-08-26 VITALS — BP 128/82 | HR 83 | Ht 65.0 in | Wt 104.0 lb

## 2021-08-26 DIAGNOSIS — M9902 Segmental and somatic dysfunction of thoracic region: Secondary | ICD-10-CM | POA: Diagnosis not present

## 2021-08-26 DIAGNOSIS — M9903 Segmental and somatic dysfunction of lumbar region: Secondary | ICD-10-CM | POA: Diagnosis not present

## 2021-08-26 DIAGNOSIS — M9904 Segmental and somatic dysfunction of sacral region: Secondary | ICD-10-CM | POA: Diagnosis not present

## 2021-08-26 DIAGNOSIS — M5416 Radiculopathy, lumbar region: Secondary | ICD-10-CM

## 2021-08-26 NOTE — Patient Instructions (Signed)
Thanks for the real estate  information Epidural order placed, try not to use it See you again in 2 months

## 2021-08-26 NOTE — Assessment & Plan Note (Signed)
Chronic problem, with radicular symptoms now on the left side, hx of surgery discussed with patient continue home exercises as well as the medications.  Warned patient of different possible side effects to the epidural but I do think at this point we will order another 1.  Patient likely will do okay.  Does have osteoporosis.  Follow-up with me again in 4 to 6 weeks.

## 2021-10-09 DIAGNOSIS — L820 Inflamed seborrheic keratosis: Secondary | ICD-10-CM | POA: Diagnosis not present

## 2021-10-09 DIAGNOSIS — L718 Other rosacea: Secondary | ICD-10-CM | POA: Diagnosis not present

## 2021-10-09 DIAGNOSIS — Z85820 Personal history of malignant melanoma of skin: Secondary | ICD-10-CM | POA: Diagnosis not present

## 2021-10-09 DIAGNOSIS — D225 Melanocytic nevi of trunk: Secondary | ICD-10-CM | POA: Diagnosis not present

## 2021-10-09 DIAGNOSIS — L570 Actinic keratosis: Secondary | ICD-10-CM | POA: Diagnosis not present

## 2021-10-09 DIAGNOSIS — L821 Other seborrheic keratosis: Secondary | ICD-10-CM | POA: Diagnosis not present

## 2021-10-09 DIAGNOSIS — C44519 Basal cell carcinoma of skin of other part of trunk: Secondary | ICD-10-CM | POA: Diagnosis not present

## 2021-10-09 DIAGNOSIS — Z85828 Personal history of other malignant neoplasm of skin: Secondary | ICD-10-CM | POA: Diagnosis not present

## 2021-10-09 DIAGNOSIS — C44729 Squamous cell carcinoma of skin of left lower limb, including hip: Secondary | ICD-10-CM | POA: Diagnosis not present

## 2021-10-09 DIAGNOSIS — D210 Benign neoplasm of connective and other soft tissue of head, face and neck: Secondary | ICD-10-CM | POA: Diagnosis not present

## 2021-10-09 DIAGNOSIS — D2271 Melanocytic nevi of right lower limb, including hip: Secondary | ICD-10-CM | POA: Diagnosis not present

## 2021-10-09 DIAGNOSIS — D485 Neoplasm of uncertain behavior of skin: Secondary | ICD-10-CM | POA: Diagnosis not present

## 2021-10-10 ENCOUNTER — Other Ambulatory Visit: Payer: Self-pay | Admitting: Family Medicine

## 2021-10-10 ENCOUNTER — Other Ambulatory Visit (HOSPITAL_COMMUNITY): Payer: Self-pay

## 2021-10-10 ENCOUNTER — Other Ambulatory Visit (HOSPITAL_BASED_OUTPATIENT_CLINIC_OR_DEPARTMENT_OTHER): Payer: Self-pay

## 2021-10-10 MED ORDER — FOLIC ACID 1 MG PO TABS
1.0000 mg | ORAL_TABLET | Freq: Every day | ORAL | 3 refills | Status: DC
  Filled 2021-10-10: qty 90, 90d supply, fill #0
  Filled 2022-01-08: qty 90, 90d supply, fill #1
  Filled 2022-04-10: qty 90, 90d supply, fill #2
  Filled 2022-07-03: qty 90, 90d supply, fill #3

## 2021-10-10 MED ORDER — AMLODIPINE BESYLATE 5 MG PO TABS
5.0000 mg | ORAL_TABLET | Freq: Every day | ORAL | 3 refills | Status: DC
  Filled 2021-10-10: qty 90, 90d supply, fill #0
  Filled 2022-01-08: qty 90, 90d supply, fill #1
  Filled 2022-04-10: qty 90, 90d supply, fill #2
  Filled 2022-07-03: qty 90, 90d supply, fill #3

## 2021-10-10 MED FILL — Duloxetine HCl Enteric Coated Pellets Cap 20 MG (Base Eq): ORAL | 30 days supply | Qty: 30 | Fill #0 | Status: AC

## 2021-10-11 ENCOUNTER — Ambulatory Visit: Payer: PPO | Attending: Internal Medicine

## 2021-10-11 ENCOUNTER — Other Ambulatory Visit (HOSPITAL_BASED_OUTPATIENT_CLINIC_OR_DEPARTMENT_OTHER): Payer: Self-pay

## 2021-10-11 ENCOUNTER — Other Ambulatory Visit: Payer: Self-pay

## 2021-10-11 DIAGNOSIS — Z23 Encounter for immunization: Secondary | ICD-10-CM

## 2021-10-11 MED ORDER — PFIZER COVID-19 VAC BIVALENT 30 MCG/0.3ML IM SUSP
INTRAMUSCULAR | 0 refills | Status: DC
  Filled 2021-10-11: qty 0.3, 1d supply, fill #0

## 2021-10-11 MED ORDER — FLUAD QUADRIVALENT 0.5 ML IM PRSY
PREFILLED_SYRINGE | INTRAMUSCULAR | 0 refills | Status: DC
Start: 2021-10-11 — End: 2022-01-14
  Filled 2021-10-11: qty 0.5, 1d supply, fill #0

## 2021-10-11 NOTE — Progress Notes (Signed)
   Covid-19 Vaccination Clinic  Name:  Tracey [name]    MRN: [id number] DOB: 10-Aug-1951  10/11/2021  Tracey [name] was observed post Covid-19 immunization for 15 minutes without incident. She was provided with Vaccine Information Sheet and instruction to access the V-Safe system.   Tracey [name] was instructed to call 911 with any severe reactions post vaccine: Difficulty breathing  Swelling of face and throat  A fast heartbeat  A bad rash all over body  Dizziness and weakness   Immunizations Administered     Name Date Dose VIS Date Route   Pfizer Covid-19 Vaccine Bivalent Booster 10/11/2021  2:14 PM 0.3 mL 07/31/2021 Intramuscular   Manufacturer: [place]   Lot: [id number]   [place]: [phone]

## 2021-10-13 NOTE — Progress Notes (Signed)
Cardiology Office Note:    Date:  10/14/2021   ID:  Tracey [name], DOB 1951-11-24, MRN [id number]  PCP:  Tracey Carol, MD  Cardiologist:  Tracey Grooms, MD   Referring MD: Tracey Carol, MD   Chief Complaint  Patient presents with   Coronary Artery Disease     History of Present Illness:    Tracey [name] is a 70 y.o. female with a hx of  CAD, hypertension, hyperlipidemia, and CAD treated with medical therapy.  Increased activity has led to decreased dyspnea on exertion.  No episodes of chest pain.  Now getting exercise using an electric bicycle.  [place] and not causing as much dyspnea.  Past Medical History:  Diagnosis Date   CAD (coronary artery disease)    Closed hip fracture ([place]) 03/2016   RT HIP   Hypertension    Osteopenia    Osteoporosis    h/o   Pneumonia    Skin cancer    multiple skin cancers    Past Surgical History:  Procedure Laterality Date   CALDWELL LUC     COLONOSCOPY WITH PROPOFOL N/A 03/22/2014   Procedure: COLONOSCOPY WITH PROPOFOL;  Surgeon: Arta Silence, MD;  Location: [facility];  Service: Endoscopy;  Laterality: N/A;   HIP PINNING,CANNULATED Right 03/21/2016   Procedure: CANNULATED HIP PINNING;  Surgeon: Rod Can, MD;  Location: [facility];  Service: Orthopedics;  Laterality: Right;   HIP SURGERY     LUMBAR LAMINECTOMY/DECOMPRESSION MICRODISCECTOMY Left 02/14/2019   Procedure: Laminectomy and Foraminotomy - L5-S1 - left;  Surgeon: Eustace Moore, MD;  Location: [facility];  Service: Neurosurgery;  Laterality: Left;  Laminectomy and Foraminotomy - L5-S1 - left   MOHS SURGERY     NASAL SEPTUM SURGERY     past hx. deviated septum   TONSILLECTOMY      Current Medications: Current Meds  Medication Sig   acetaminophen (TYLENOL) 500 MG tablet Take 1,000 mg by mouth every 6 (six) hours as needed (for pain.).   amLODipine (NORVASC) 5 MG tablet TAKE 1 TABLET BY MOUTH ONCE A DAY   aspirin EC 81  MG tablet Take 81 mg by mouth every evening.   cholecalciferol (VITAMIN D) 25 MCG (1000 UT) tablet Take 1,000 Units by mouth daily.   COVID-19 mRNA bivalent vaccine, Pfizer, (PFIZER COVID-19 VAC BIVALENT) injection Inject into the muscle.   DULoxetine (CYMBALTA) 20 MG capsule TAKE 1 CAPSULE (20 MG TOTAL) BY MOUTH DAILY.   DULoxetine (CYMBALTA) 20 MG capsule Take 1 capsule (20 mg total) by mouth daily.   estradiol (ESTRACE) 0.1 MG/GM vaginal cream INSERT 1/2 APPLICATORFUL VAGINALLY 2 (TWO) TIMES A WEEK. SUNDAYS & WEDNESDAYS   fish oil-omega-3 fatty acids 1000 MG capsule Take 1,000 mg by mouth 2 (two) times daily.    folic acid (FOLVITE) 1 MG tablet TAKE 1 TABLET BY MOUTH ONCE A DAY   gabapentin (NEURONTIN) 400 MG capsule Take 400 mg by mouth at bedtime.   influenza vaccine adjuvanted (FLUAD QUADRIVALENT) 0.5 ML injection Inject into the muscle.   methocarbamol (ROBAXIN) 500 MG tablet TAKE 1 TABLET (500 MG TOTAL) BY MOUTH EVERY 8 (EIGHT) HOURS AS NEEDED FOR MUSCLE SPASMS.   metroNIDAZOLE (METROGEL) 1 % gel APPLY TOPICALLY TO THE AFFECTED AREA ONCE OR TWICE DAILY AS NEEDED   Multiple Vitamin (MULTIVITAMIN WITH MINERALS) TABS tablet Take 1 tablet by mouth daily.   Polyethyl Glycol-Propyl Glycol (SYSTANE OP) Place 1 drop  into both eyes 2 (two) times daily as needed (Dry/Irritated eyes).   psyllium (METAMUCIL) 58.6 % packet Take 1 packet by mouth daily.   rosuvastatin (CRESTOR) 20 MG tablet Take 1 tablet (20 mg total) by mouth daily. Please keep upcoming appt in November 2022 with Dr. Tamala [name] before anymore refills. Thank you   Zoledronic Acid (RECLAST IV) Inject 1 Dose into the vein once. Once a year     Allergies:   Cefaclor, Adhesive [tape], Fosamax [alendronate sodium], and Neomycin-bacitracin zn-polymyx   Social History   Socioeconomic History   Marital status: Married    Spouse name: Tracey [name]   Number of children: 1   Years of education: Not on file   Highest education level: Not on file   Occupational History   Occupation: MED TECH    Employer: [employer]  Tobacco Use   Smoking status: Never   Smokeless tobacco: Never  Vaping Use   Vaping Use: Never used  Substance and Sexual Activity   Alcohol use: Yes    Alcohol/week: 0.3 standard drinks    Comment: Rare   Drug use: No   Sexual activity: Yes    Partners: Male    Comment: married, [place]  Other Topics Concern   Not on file  Social History Narrative   Not on file   Social Determinants of Health   Financial Resource Strain: Not on file  Food Insecurity: Not on file  Transportation Needs: Not on file  Physical Activity: Not on file  Stress: Not on file  Social Connections: Not on file     Family History: The patient's family history includes Breast cancer in her maternal aunt and paternal aunt; Cancer in her maternal grandfather; Diabetes in her brother; Heart disease in her paternal grandfather; Hyperlipidemia in her maternal grandmother; Hypertension in her brother, father, and mother; Leukemia in her father; Stroke in her paternal grandfather.  ROS:   Please see the history of present illness.    No new complaints.  Doing better physically than on the last office visit.  All other systems reviewed and are negative.  EKGs/Labs/Other Studies Reviewed:    The following studies were reviewed today:  Coronary calcium score 2021:   IMPRESSION: 1. Coronary calcium score of 704. This was 95 percentile for age and sex matched control.   2. Normal coronary origin with right dominance.   3. Non flow limiting LAD calcified plaque (0-24%), Moderate calcified plaque in circumflex distribution at bifurcation of OM and AV groove circumflex (25-49%) and scattered calcified lesions in proximal and mid RCA up to 50-74% which may be flow limiting. Sending for CT FFR analysis.      ETT 05/2021: Study Highlights    Blood pressure demonstrated a normal response to exercise. There was no ST segment  deviation noted during stress.   ETT with fair exercise tolerance (6:00); no chest pain; normal blood pressure response; no ST changes; negative adequate exercise tolerance test; Duke treadmill score 6.  EKG:  EKG not repeated  Recent Labs: No results found for requested labs within last 8760 hours.  Recent Lipid Panel    Component Value Date/Time   CHOL 169 06/27/2020 1106   TRIG 92 06/27/2020 1106   HDL 94 06/27/2020 1106   CHOLHDL 1.8 06/27/2020 1106   CHOLHDL 2.4 04/20/2020 0650   VLDL 11 04/20/2020 0650   LDLCALC 59 06/27/2020 1106    Physical Exam:    VS:  BP 128/80   Pulse 78  Ht 5\' 5"  (1.651 m)   Wt 105 lb 6.4 oz (47.8 kg)   SpO2 99%   BMI 17.54 kg/m     Wt Readings from Last 3 Encounters:  10/14/21 105 lb 6.4 oz (47.8 kg)  08/26/21 104 lb (47.2 kg)  05/27/21 101 lb (45.8 kg)     GEN: Slender. No acute distress HEENT: Normal NECK: No JVD. LYMPHATICS: No lymphadenopathy CARDIAC: No murmur. RRR no gallop, or edema. VASCULAR:  Normal Pulses. No bruits. RESPIRATORY:  Clear to auscultation without rales, wheezing or rhonchi  ABDOMEN: Soft, non-tender, non-distended, No pulsatile mass, MUSCULOSKELETAL: No deformity  SKIN: Warm and dry NEUROLOGIC:  Alert and oriented x 3 PSYCHIATRIC:  Normal affect   ASSESSMENT:    1. Coronary artery disease involving native coronary artery of native heart without angina pectoris   2. Essential hypertension   3. Hyperlipidemia LDL goal <70   4. Left ventricular hypertrophy    PLAN:    In order of problems listed above:  Asymptomatic.  Coronary calcium score 700.  Continue primary prevention. Blood pressures well controlled on current therapy.  Continue amlodipine.  Continue rosuvastatin.  Most recent LDL was 56. Continue excellent blood pressure control.   Medication Adjustments/Labs and Tests Ordered: Current medicines are reviewed at length with the patient today.  Concerns regarding medicines are outlined above.   No orders of the defined types were placed in this encounter.  No orders of the defined types were placed in this encounter.   Patient Instructions  Medication Instructions:  Your physician recommends that you continue on your current medications as directed. Please refer to the Current Medication list given to you today.  *If you need a refill on your cardiac medications before your next appointment, please call your pharmacy*   Lab Work: None  If you have labs (blood work) drawn today and your tests are completely normal, you will receive your results only by: [place] (if you have MyChart) OR A paper copy in the mail If you have any lab test that is abnormal or we need to change your treatment, we will call you to review the results.   Testing/Procedures: None   Follow-Up: At [facility], you and your health needs are our priority.  As part of our continuing mission to provide you with exceptional heart care, we have created designated Provider Care Teams.  These Care Teams include your primary Cardiologist (physician) and Advanced Practice Providers (APPs -  Physician Assistants and Nurse Practitioners) who all work together to provide you with the care you need, when you need it.  Your next appointment:   1 year(s)  The format for your next appointment:   In Person  Provider:   Sinclair Grooms, MD          Signed, Tracey Grooms, MD  10/14/2021 1:44 PM    [place]

## 2021-10-14 ENCOUNTER — Ambulatory Visit: Payer: PPO | Admitting: Interventional Cardiology

## 2021-10-14 ENCOUNTER — Encounter: Payer: Self-pay | Admitting: Interventional Cardiology

## 2021-10-14 ENCOUNTER — Other Ambulatory Visit: Payer: Self-pay

## 2021-10-14 VITALS — BP 128/80 | HR 78 | Ht 65.0 in | Wt 105.4 lb

## 2021-10-14 DIAGNOSIS — E785 Hyperlipidemia, unspecified: Secondary | ICD-10-CM | POA: Diagnosis not present

## 2021-10-14 DIAGNOSIS — I10 Essential (primary) hypertension: Secondary | ICD-10-CM | POA: Diagnosis not present

## 2021-10-14 DIAGNOSIS — I493 Ventricular premature depolarization: Secondary | ICD-10-CM

## 2021-10-14 DIAGNOSIS — I517 Cardiomegaly: Secondary | ICD-10-CM | POA: Diagnosis not present

## 2021-10-14 DIAGNOSIS — I2510 Atherosclerotic heart disease of native coronary artery without angina pectoris: Secondary | ICD-10-CM

## 2021-10-14 NOTE — Patient Instructions (Signed)
Medication Instructions:  Your physician recommends that you continue on your current medications as directed. Please refer to the Current Medication list given to you today.  *If you need a refill on your cardiac medications before your next appointment, please call your pharmacy*   Lab Work: None  If you have labs (blood work) drawn today and your tests are completely normal, you will receive your results only by: [place] (if you have MyChart) OR A paper copy in the mail If you have any lab test that is abnormal or we need to change your treatment, we will call you to review the results.   Testing/Procedures: None   Follow-Up: At [facility], you and your health needs are our priority.  As part of our continuing mission to provide you with exceptional heart care, we have created designated Provider Care Teams.  These Care Teams include your primary Cardiologist (physician) and Advanced Practice Providers (APPs -  Physician Assistants and Nurse Practitioners) who all work together to provide you with the care you need, when you need it.  Your next appointment:   1 year(s)  The format for your next appointment:   In Person  Provider:   Sinclair Grooms, MD

## 2021-10-15 ENCOUNTER — Other Ambulatory Visit: Payer: Self-pay | Admitting: Obstetrics & Gynecology

## 2021-10-15 DIAGNOSIS — Z1231 Encounter for screening mammogram for malignant neoplasm of breast: Secondary | ICD-10-CM

## 2021-10-28 ENCOUNTER — Other Ambulatory Visit (HOSPITAL_BASED_OUTPATIENT_CLINIC_OR_DEPARTMENT_OTHER): Payer: Self-pay

## 2021-10-28 ENCOUNTER — Other Ambulatory Visit: Payer: Self-pay | Admitting: Family Medicine

## 2021-10-28 MED ORDER — METHOCARBAMOL 500 MG PO TABS
500.0000 mg | ORAL_TABLET | Freq: Three times a day (TID) | ORAL | 1 refills | Status: DC | PRN
  Filled 2021-10-28: qty 90, 30d supply, fill #0
  Filled 2022-01-28: qty 90, 30d supply, fill #1

## 2021-10-28 NOTE — Progress Notes (Signed)
[place] [address] [place] Phone: [phone] Subjective:   IVilma Meckel, am serving as a scribe for Dr. Hulan Saas. This visit occurred during the SARS-CoV-2 public health emergency.  Safety protocols were in place, including screening questions prior to the visit, additional usage of staff PPE, and extensive cleaning of exam room while observing appropriate contact time as indicated for disinfecting solutions.   I'm seeing this patient by the request  of:  [name], MD  CC: Back and neck pain follow-up  [id number]  Moon A [place] is a 70 y.o. female coming in with complaint of back and neck pain. OMT on 08/29/2021.  Patient states is doing well. No new complaints.  Patient has been feeling good.  Feels like her new bicycle has been helpful.  Medications patient has been prescribed: None         Reviewed prior external information including notes and imaging from previsou exam, outside providers and external EMR if available.   As well as notes that were available from care everywhere and other healthcare systems.  Past medical history, social, surgical and family history all reviewed in electronic medical record.  No pertanent information unless stated regarding to the chief complaint.   Past Medical History:  Diagnosis Date   CAD (coronary artery disease)    Closed hip fracture ([place]) 03/2016   RT HIP   Hypertension    Osteopenia    Osteoporosis    h/o   Pneumonia    Skin cancer    multiple skin cancers    Allergies  Allergen Reactions   Cefaclor Hives   Adhesive [Tape]    Fosamax [Alendronate Sodium] Nausea Only    Gi upset   Neomycin-Bacitracin Zn-Polymyx Rash     Review of Systems:  No headache, visual changes, nausea, vomiting, diarrhea, constipation, dizziness, abdominal pain, skin rash, fevers, chills, night sweats, weight loss, swollen lymph nodes, body aches, joint swelling, chest  pain, shortness of breath, mood changes. POSITIVE muscle aches  Objective  Blood pressure 122/80, pulse 75, height 5\' 5"  (1.651 m), weight 106 lb (48.1 kg), SpO2 99 %.   General: No apparent distress alert and oriented x3 mood and affect normal, dressed appropriately.  HEENT: Pupils equal, extraocular movements intact  Respiratory: Patient's speak in full sentences and does not appear short of breath  Cardiovascular: No lower extremity edema, non tender, no erythema  Low back exam does have some mild loss of lordosis.  The patient does have some tightness noted in the parascapular region right greater than left.  Osteopathic findings  C2 flexed rotated and side bent right C7 flexed rotated and side bent left T3 extended rotated and side bent right inhaled rib T9 extended rotated and side bent left L2 flexed rotated and side bent right Sacrum right on right       Assessment and Plan: Lumbar radiculopathy Chronic problem but seems to be stable.  Patient is to continue to do the vitamin D.  Cymbalta has been helpful with some of the chronic pain.  No significant changes at the moment.  Follow-up with me again in 2 months    Nonallopathic problems  Decision today to treat with OMT was based on Physical Exam  After verbal consent patient was treated with  ME, FPR techniques in cervical, rib, thoracic, lumbar, and sacral  areas avoided HVLA secondary to patient's osteoporosis today.  Patient tolerated the procedure well with improvement in symptoms  Patient given exercises, stretches and lifestyle modifications  See medications in patient instructions if given  Patient will follow up in 4-8 weeks      The above documentation has been reviewed and is accurate and complete [name], DO        Note: This dictation was prepared with Dragon dictation along with smaller phrase technology. Any transcriptional errors that result from this process are unintentional.

## 2021-10-29 ENCOUNTER — Other Ambulatory Visit (HOSPITAL_BASED_OUTPATIENT_CLINIC_OR_DEPARTMENT_OTHER): Payer: Self-pay

## 2021-10-29 ENCOUNTER — Ambulatory Visit: Payer: PPO | Admitting: Family Medicine

## 2021-10-29 ENCOUNTER — Other Ambulatory Visit: Payer: Self-pay | Admitting: Obstetrics & Gynecology

## 2021-10-29 ENCOUNTER — Other Ambulatory Visit: Payer: Self-pay

## 2021-10-29 VITALS — BP 122/80 | HR 75 | Ht 65.0 in | Wt 106.0 lb

## 2021-10-29 DIAGNOSIS — M9903 Segmental and somatic dysfunction of lumbar region: Secondary | ICD-10-CM

## 2021-10-29 DIAGNOSIS — M9901 Segmental and somatic dysfunction of cervical region: Secondary | ICD-10-CM

## 2021-10-29 DIAGNOSIS — M9904 Segmental and somatic dysfunction of sacral region: Secondary | ICD-10-CM | POA: Diagnosis not present

## 2021-10-29 DIAGNOSIS — M9902 Segmental and somatic dysfunction of thoracic region: Secondary | ICD-10-CM

## 2021-10-29 DIAGNOSIS — M5416 Radiculopathy, lumbar region: Secondary | ICD-10-CM | POA: Diagnosis not present

## 2021-10-29 NOTE — Patient Instructions (Signed)
Good to see you! Happy Holidays Thanks for the info on the bike See you again in 2 months

## 2021-10-29 NOTE — Telephone Encounter (Signed)
Annul exam scheduled on 01/14/22

## 2021-10-29 NOTE — Assessment & Plan Note (Signed)
Chronic problem but seems to be stable.  Patient is to continue to do the vitamin D.  Cymbalta has been helpful with some of the chronic pain.  No significant changes at the moment.  Follow-up with me again in 2 months

## 2021-10-30 ENCOUNTER — Other Ambulatory Visit (HOSPITAL_BASED_OUTPATIENT_CLINIC_OR_DEPARTMENT_OTHER): Payer: Self-pay

## 2021-10-30 MED ORDER — ESTRADIOL 0.1 MG/GM VA CREA
TOPICAL_CREAM | VAGINAL | 0 refills | Status: DC
  Filled 2021-10-30: qty 42.5, 90d supply, fill #0

## 2021-11-11 ENCOUNTER — Other Ambulatory Visit: Payer: Self-pay | Admitting: Family Medicine

## 2021-11-11 ENCOUNTER — Other Ambulatory Visit: Payer: Self-pay | Admitting: Interventional Cardiology

## 2021-11-11 ENCOUNTER — Other Ambulatory Visit (HOSPITAL_BASED_OUTPATIENT_CLINIC_OR_DEPARTMENT_OTHER): Payer: Self-pay

## 2021-11-11 DIAGNOSIS — Z85820 Personal history of malignant melanoma of skin: Secondary | ICD-10-CM | POA: Diagnosis not present

## 2021-11-11 DIAGNOSIS — D485 Neoplasm of uncertain behavior of skin: Secondary | ICD-10-CM | POA: Diagnosis not present

## 2021-11-11 DIAGNOSIS — Z85828 Personal history of other malignant neoplasm of skin: Secondary | ICD-10-CM | POA: Diagnosis not present

## 2021-11-11 MED ORDER — ROSUVASTATIN CALCIUM 20 MG PO TABS
20.0000 mg | ORAL_TABLET | Freq: Every day | ORAL | 3 refills | Status: DC
  Filled 2021-11-11: qty 90, 90d supply, fill #0
  Filled 2022-02-11: qty 90, 90d supply, fill #1
  Filled 2022-05-08: qty 90, 90d supply, fill #2
  Filled 2022-08-06: qty 90, 90d supply, fill #3

## 2021-11-12 ENCOUNTER — Other Ambulatory Visit (HOSPITAL_BASED_OUTPATIENT_CLINIC_OR_DEPARTMENT_OTHER): Payer: Self-pay

## 2021-11-12 MED ORDER — DULOXETINE HCL 20 MG PO CPEP
20.0000 mg | ORAL_CAPSULE | Freq: Every day | ORAL | 0 refills | Status: DC
  Filled 2021-11-12: qty 60, 60d supply, fill #0
  Filled 2021-11-12: qty 30, 30d supply, fill #0

## 2021-11-15 ENCOUNTER — Ambulatory Visit
Admission: RE | Admit: 2021-11-15 | Discharge: 2021-11-15 | Disposition: A | Discharge status: Home / Self Care | Payer: PPO | Source: Ambulatory Visit | Attending: Obstetrics & Gynecology | Admitting: Obstetrics & Gynecology

## 2021-11-15 DIAGNOSIS — Z1231 Encounter for screening mammogram for malignant neoplasm of breast: Secondary | ICD-10-CM | POA: Diagnosis not present

## 2021-11-22 ENCOUNTER — Other Ambulatory Visit (HOSPITAL_BASED_OUTPATIENT_CLINIC_OR_DEPARTMENT_OTHER): Payer: Self-pay

## 2021-12-30 NOTE — Progress Notes (Signed)
°  [place] Phone: [phone] Subjective:   [name], am serving as a scribe for Dr. Hulan Saas.This visit occurred during the SARS-CoV-2 public health emergency.  Safety protocols were in place, including screening questions prior to the visit, additional usage of staff PPE, and extensive cleaning of exam room while observing appropriate contact time as indicated for disinfecting solutions.  I'm seeing this patient by the request  of:  [name], MD  CC: Back and neck pain follow-up  [id number]  Tracey [name] is a 71 y.o. female coming in with complaint of back and neck pain. OMT on 10/29/2021. Patient states that her L knee pain still continues. Otherwise her back is doing much better.  Patient is doing well overall.  Not having any significant pain.  Patient is staying fairly active.  Medications patient has been prescribed: Crestor, Cymbalta, Robaxin  Taking:         Reviewed prior external information including notes and imaging from previsou exam, outside providers and external EMR if available.   As well as notes that were available from care everywhere and other healthcare systems.  Past medical history, social, surgical and family history all reviewed in electronic medical record.  No pertanent information unless stated regarding to the chief complaint.   Past Medical History:  Diagnosis Date   CAD (coronary artery disease)    Closed hip fracture ([place]) 03/2016   RT HIP   Hypertension    Osteopenia    Osteoporosis    h/o   Pneumonia    Skin cancer    multiple skin cancers    Allergies  Allergen Reactions   Cefaclor Hives   Adhesive [Tape]    Fosamax [Alendronate Sodium] Nausea Only    Gi upset   Neomycin-Bacitracin Zn-Polymyx Rash     Review of Systems:  No headache, visual changes, nausea, vomiting, diarrhea, constipation, dizziness, abdominal pain, skin rash,  fevers, chills, night sweats, weight loss, swollen lymph nodes, body aches, joint swelling, chest pain, shortness of breath, mood changes.  Objective  Blood pressure 122/82, pulse 87, height 5\' 5"  (1.651 m), weight 110 lb (49.9 kg), SpO2 99 %.   General: No apparent distress alert and oriented x3 mood and affect normal, dressed appropriately.  HEENT: Pupils equal, extraocular movements intact  Respiratory: Patient's speak in full sentences and does not appear short of breath  Cardiovascular: No lower extremity edema, non tender, no erythema  Arthritic changes but doing relatively.  Some very mild loss lordosis but nothing severe.  Patient otherwise seems to be sitting comfortably.      Assessment and Plan:        The above documentation has been reviewed and is accurate and complete [name], DO         Note: This dictation was prepared with Dragon dictation along with smaller phrase technology. Any transcriptional errors that result from this process are unintentional.

## 2021-12-31 ENCOUNTER — Other Ambulatory Visit: Payer: Self-pay

## 2021-12-31 ENCOUNTER — Ambulatory Visit: Payer: PPO | Admitting: Family Medicine

## 2021-12-31 ENCOUNTER — Encounter: Payer: Self-pay | Admitting: Family Medicine

## 2021-12-31 DIAGNOSIS — M533 Sacrococcygeal disorders, not elsewhere classified: Secondary | ICD-10-CM | POA: Diagnosis not present

## 2021-12-31 NOTE — Patient Instructions (Signed)
Doing well! Knee is just the knee cap Have fun on biking trip in [facility] See me in 6-8 weeks

## 2021-12-31 NOTE — Assessment & Plan Note (Signed)
Doing well. Discussed HEP  Ice 20 minutes 2 times daily. Usually after activity and before bed. Stay active See me again in 6 weeks to 8 weeks

## 2022-01-09 ENCOUNTER — Other Ambulatory Visit (HOSPITAL_BASED_OUTPATIENT_CLINIC_OR_DEPARTMENT_OTHER): Payer: Self-pay

## 2022-01-10 DIAGNOSIS — Z85820 Personal history of malignant melanoma of skin: Secondary | ICD-10-CM | POA: Diagnosis not present

## 2022-01-10 DIAGNOSIS — D485 Neoplasm of uncertain behavior of skin: Secondary | ICD-10-CM | POA: Diagnosis not present

## 2022-01-10 DIAGNOSIS — Z85828 Personal history of other malignant neoplasm of skin: Secondary | ICD-10-CM | POA: Diagnosis not present

## 2022-01-10 DIAGNOSIS — C4441 Basal cell carcinoma of skin of scalp and neck: Secondary | ICD-10-CM | POA: Diagnosis not present

## 2022-01-10 DIAGNOSIS — D2272 Melanocytic nevi of left lower limb, including hip: Secondary | ICD-10-CM | POA: Diagnosis not present

## 2022-01-10 DIAGNOSIS — D2271 Melanocytic nevi of right lower limb, including hip: Secondary | ICD-10-CM | POA: Diagnosis not present

## 2022-01-10 DIAGNOSIS — C44519 Basal cell carcinoma of skin of other part of trunk: Secondary | ICD-10-CM | POA: Diagnosis not present

## 2022-01-10 DIAGNOSIS — D225 Melanocytic nevi of trunk: Secondary | ICD-10-CM | POA: Diagnosis not present

## 2022-01-10 DIAGNOSIS — D1801 Hemangioma of skin and subcutaneous tissue: Secondary | ICD-10-CM | POA: Diagnosis not present

## 2022-01-10 DIAGNOSIS — D045 Carcinoma in situ of skin of trunk: Secondary | ICD-10-CM | POA: Diagnosis not present

## 2022-01-10 DIAGNOSIS — L570 Actinic keratosis: Secondary | ICD-10-CM | POA: Diagnosis not present

## 2022-01-14 ENCOUNTER — Other Ambulatory Visit (HOSPITAL_BASED_OUTPATIENT_CLINIC_OR_DEPARTMENT_OTHER): Payer: Self-pay

## 2022-01-14 ENCOUNTER — Other Ambulatory Visit: Payer: Self-pay

## 2022-01-14 ENCOUNTER — Ambulatory Visit (INDEPENDENT_AMBULATORY_CARE_PROVIDER_SITE_OTHER): Payer: PPO | Admitting: Obstetrics & Gynecology

## 2022-01-14 ENCOUNTER — Encounter: Payer: Self-pay | Admitting: Obstetrics & Gynecology

## 2022-01-14 VITALS — BP 110/74 | HR 83 | Resp 16 | Ht 65.0 in | Wt 108.0 lb

## 2022-01-14 DIAGNOSIS — N952 Postmenopausal atrophic vaginitis: Secondary | ICD-10-CM

## 2022-01-14 DIAGNOSIS — Z01419 Encounter for gynecological examination (general) (routine) without abnormal findings: Secondary | ICD-10-CM | POA: Diagnosis not present

## 2022-01-14 DIAGNOSIS — M810 Age-related osteoporosis without current pathological fracture: Secondary | ICD-10-CM

## 2022-01-14 DIAGNOSIS — Z780 Asymptomatic menopausal state: Secondary | ICD-10-CM | POA: Diagnosis not present

## 2022-01-14 DIAGNOSIS — R3915 Urgency of urination: Secondary | ICD-10-CM

## 2022-01-14 MED ORDER — ESTRADIOL 0.1 MG/GM VA CREA
TOPICAL_CREAM | VAGINAL | 4 refills | Status: DC
  Filled 2022-01-14: qty 42.5, 90d supply, fill #0
  Filled 2022-04-10: qty 42.5, 90d supply, fill #1
  Filled 2022-07-21: qty 42.5, 90d supply, fill #2
  Filled 2022-10-25: qty 42.5, 90d supply, fill #3
  Filled 2023-01-14: qty 42.5, 90d supply, fill #4

## 2022-01-14 NOTE — Progress Notes (Signed)
Tracey [name] 1951/01/24 [id number]   History:    71 y.o. G1P1L1 Married.  Dauhgter is 71 yo, has 2 boys.   RP:  Established patient presenting for annual gyn exam    HPI:  Postmenopause, well on no HRT.  No PMB. Had a small hematometra with EBx Benign in 10/2018.  No pelvic pain.  No pain with IC on Estrace cream. Pap Neg in 08/2019.  Urine/BMs normal.  Breasts normal. Mammo 10/2021 Neg. BMI 17.97. Walking.  BD Osteoporosis in 07/2020 on Reclast.  Health labs with Fam MD.  Harriet Masson 2015.    Past medical history,surgical history, family history and social history were all reviewed and documented in the EPIC chart.  Gynecologic History No LMP recorded. Patient is postmenopausal.  Obstetric History OB History  Gravida Para Term Preterm AB Living  1 1       1   SAB IAB Ectopic Multiple Live Births               # Outcome Date GA Lbr Len/2nd Weight Sex Delivery Anes PTL Lv  1 Para 1978    F Vag-Spont        ROS: A ROS was performed and pertinent positives and negatives are included in the history.  GENERAL: No fevers or chills. HEENT: No change in vision, no earache, sore throat or sinus congestion. NECK: No pain or stiffness. CARDIOVASCULAR: No chest pain or pressure. No palpitations. PULMONARY: No shortness of breath, cough or wheeze. GASTROINTESTINAL: No abdominal pain, nausea, vomiting or diarrhea, melena or bright red blood per rectum. GENITOURINARY: No urinary frequency, urgency, hesitancy or dysuria. MUSCULOSKELETAL: No joint or muscle pain, no back pain, no recent trauma. DERMATOLOGIC: No rash, no itching, no lesions. ENDOCRINE: No polyuria, polydipsia, no heat or cold intolerance. No recent change in weight. HEMATOLOGICAL: No anemia or easy bruising or bleeding. NEUROLOGIC: No headache, seizures, numbness, tingling or weakness. PSYCHIATRIC: No depression, no loss of interest in normal activity or change in sleep pattern.     Exam:   BP 110/74    Pulse 83    Resp 16    Ht 5\' 5"   (1.651 m)    Wt 108 lb (49 kg)    BMI 17.97 kg/m   Body mass index is 17.97 kg/m.  General appearance : Well developed well nourished female. No acute distress HEENT: Eyes: no retinal hemorrhage or exudates,  Neck supple, trachea midline, no carotid bruits, no thyroidmegaly Lungs: Clear to auscultation, no rhonchi or wheezes, or rib retractions  Heart: Regular rate and rhythm, no murmurs or gallops Breast:Examined in sitting and supine position were symmetrical in appearance, no palpable masses or tenderness,  no skin retraction, no nipple inversion, no nipple discharge, no skin discoloration, no axillary or supraclavicular lymphadenopathy Abdomen: no palpable masses or tenderness, no rebound or guarding Extremities: no edema or skin discoloration or tenderness  Pelvic: Vulva: Normal             Vagina: No gross lesions or discharge  Cervix: No gross lesions or discharge  Uterus  AV, normal size, shape and consistency, non-tender and mobile  Adnexa  Without masses or tenderness  Anus: Normal   Assessment/Plan:  71 y.o. female for annual exam   1. Well female exam with routine gynecological exam Postmenopause, well on no HRT.  No PMB. Had a small hematometra with EBx Benign in 10/2018.  No pelvic pain.  No pain with IC on Estrace cream. Pap Neg in 08/2019.  Urine/BMs normal. Breasts normal. Mammo 10/2021 Neg. BMI 17.97. Walking.  BD Osteoporosis in 07/2020 on Reclast.  Health labs with Fam MD.  Harriet Masson 2015.   2. Postmenopause Postmenopause, well on no HRT.  No PMB. Had a small hematometra with EBx Benign in 10/2018.  No pelvic pain.  No pain with IC on Estrace cream  3. Postmenopausal atrophic vaginitis Well on Estrace cream 1/2 an applicator twice a week.  Prescription sent to pharmacy.  4. Age-related osteoporosis without current pathological fracture Continue on Reclast.  Ca++/Vit D.  Regular weight bearing physical activities. Repeat BD 07/2022.  5. Urinary urgency  Counseling  done on urinary urgency.  Will reduce bladder irritants and empty her bladder before it overfills.  Declines Urology referral at this time.  Declines Anticholinergics as they caused her to have difficulty initiating urination in the past.  Other orders - estradiol (ESTRACE) 0.1 MG/GM vaginal cream; INSERT 1/2 APPLICATORFUL VAGINALLY 2 (TWO) TIMES A WEEK. [facility]   Princess Bruins MD, 1:54 PM 01/14/2022

## 2022-01-28 DIAGNOSIS — H26491 Other secondary cataract, right eye: Secondary | ICD-10-CM | POA: Diagnosis not present

## 2022-01-28 DIAGNOSIS — Z961 Presence of intraocular lens: Secondary | ICD-10-CM | POA: Diagnosis not present

## 2022-01-29 ENCOUNTER — Other Ambulatory Visit (HOSPITAL_BASED_OUTPATIENT_CLINIC_OR_DEPARTMENT_OTHER): Payer: Self-pay

## 2022-02-10 DIAGNOSIS — M810 Age-related osteoporosis without current pathological fracture: Secondary | ICD-10-CM | POA: Diagnosis not present

## 2022-02-10 DIAGNOSIS — Z8781 Personal history of (healed) traumatic fracture: Secondary | ICD-10-CM | POA: Diagnosis not present

## 2022-02-10 NOTE — Progress Notes (Unsigned)
[place] Phone: [phone] Subjective:    I'm seeing this patient by the request  of:  [name], MD  CC:   [id number]  Tracey [name] is a 71 y.o. female coming in with complaint of back and neck pain. OMT on 10/29/2021.  Seen 12/30/2021  Doing well. Discussed HEP  Ice 20 minutes 2 times daily. Usually after activity and before bed. Stay active See me again in 6 weeks to 8 weeks    Patient states   Medications patient has been prescribed:   Taking:         Reviewed prior external information including notes and imaging from previsou exam, outside providers and external EMR if available.   As well as notes that were available from care everywhere and other healthcare systems.  Past medical history, social, surgical and family history all reviewed in electronic medical record.  No pertanent information unless stated regarding to the chief complaint.   Past Medical History:  Diagnosis Date   CAD (coronary artery disease)    Closed hip fracture ([place]) 03/2016   RT HIP   HSV-1 (herpes simplex virus 1) infection    Hypertension    Osteopenia    Osteoporosis    h/o   Pneumonia    Skin cancer    multiple skin cancers    Allergies  Allergen Reactions   Cefaclor Hives   Abaloparatide     Other reaction(s): headaches, lethargy, fatigue   Adhesive [Tape]    Fosamax [Alendronate Sodium] Nausea Only    Gi upset   Neomycin-Bacitracin Zn-Polymyx Rash     Review of Systems:  No headache, visual changes, nausea, vomiting, diarrhea, constipation, dizziness, abdominal pain, skin rash, fevers, chills, night sweats, weight loss, swollen lymph nodes, body aches, joint swelling, chest pain, shortness of breath, mood changes. POSITIVE muscle aches  Objective  There were no vitals taken for this visit.   General: No apparent distress alert and oriented x3 mood and affect normal, dressed  appropriately.  HEENT: Pupils equal, extraocular movements intact  Respiratory: Patient's speak in full sentences and does not appear short of breath  Cardiovascular: No lower extremity edema, non tender, no erythema  Neuro: Cranial nerves II through XII are intact, neurovascularly intact in all extremities with 2+ DTRs and 2+ pulses.  Gait normal with good balance and coordination.  MSK:  Non tender with full range of motion and good stability and symmetric strength and tone of shoulders, elbows, wrist, hip, knee and ankles bilaterally.  Back - Normal skin, Spine with normal alignment and no deformity.  No tenderness to vertebral process palpation.  Paraspinous muscles are not tender and without spasm.   Range of motion is full at neck and lumbar sacral regions  Osteopathic findings  C2 flexed rotated and side bent right C6 flexed rotated and side bent left T3 extended rotated and side bent right inhaled rib T9 extended rotated and side bent left L2 flexed rotated and side bent right Sacrum right on right       Assessment and Plan:    Nonallopathic problems  Decision today to treat with OMT was based on Physical Exam  After verbal consent patient was treated with HVLA, ME, FPR techniques in cervical, rib, thoracic, lumbar, and sacral  areas  Patient tolerated the procedure well with improvement in symptoms  Patient given exercises, stretches and lifestyle modifications  See medications in patient instructions if given  Patient will follow up in 4-8 weeks      The above documentation has been reviewed and is accurate and complete Delsa Sale       Note: This dictation was prepared with Dragon dictation along with smaller phrase technology. Any transcriptional errors that result from this process are unintentional.

## 2022-02-11 ENCOUNTER — Ambulatory Visit: Payer: PPO | Admitting: Family Medicine

## 2022-02-11 ENCOUNTER — Encounter: Payer: Self-pay | Admitting: Family Medicine

## 2022-02-11 ENCOUNTER — Other Ambulatory Visit (HOSPITAL_BASED_OUTPATIENT_CLINIC_OR_DEPARTMENT_OTHER): Payer: Self-pay

## 2022-02-11 ENCOUNTER — Other Ambulatory Visit: Payer: Self-pay

## 2022-02-11 VITALS — BP 110/84 | HR 84 | Ht 65.0 in | Wt 108.0 lb

## 2022-02-11 DIAGNOSIS — M9901 Segmental and somatic dysfunction of cervical region: Secondary | ICD-10-CM | POA: Diagnosis not present

## 2022-02-11 DIAGNOSIS — M9908 Segmental and somatic dysfunction of rib cage: Secondary | ICD-10-CM

## 2022-02-11 DIAGNOSIS — M9904 Segmental and somatic dysfunction of sacral region: Secondary | ICD-10-CM

## 2022-02-11 DIAGNOSIS — M9902 Segmental and somatic dysfunction of thoracic region: Secondary | ICD-10-CM | POA: Diagnosis not present

## 2022-02-11 DIAGNOSIS — M9903 Segmental and somatic dysfunction of lumbar region: Secondary | ICD-10-CM | POA: Diagnosis not present

## 2022-02-11 DIAGNOSIS — M533 Sacrococcygeal disorders, not elsewhere classified: Secondary | ICD-10-CM

## 2022-02-11 MED ORDER — DULOXETINE HCL 20 MG PO CPEP
20.0000 mg | ORAL_CAPSULE | Freq: Every day | ORAL | 0 refills | Status: DC
  Filled 2022-02-11: qty 90, 90d supply, fill #0

## 2022-02-11 NOTE — Assessment & Plan Note (Signed)
Patient responded again to muscle energy today.  Patient has been increasing activity where appropriate.  Patient has been biking more and I think that this could potentially have been caused some more discomfort as well.  Patient will follow-up with me again in 6 weeks. ?

## 2022-02-11 NOTE — Patient Instructions (Signed)
Good to see you ?Let other people work on your kitchen ?Keep biking  ?See me again in 6-8 weeks ?

## 2022-02-12 ENCOUNTER — Encounter (HOSPITAL_BASED_OUTPATIENT_CLINIC_OR_DEPARTMENT_OTHER): Payer: Self-pay | Admitting: Pharmacist

## 2022-02-12 ENCOUNTER — Other Ambulatory Visit (HOSPITAL_BASED_OUTPATIENT_CLINIC_OR_DEPARTMENT_OTHER): Payer: Self-pay

## 2022-02-12 MED ORDER — GABAPENTIN 400 MG PO CAPS
ORAL_CAPSULE | ORAL | 3 refills | Status: DC
  Filled 2022-02-12: qty 270, 90d supply, fill #0
  Filled 2022-09-30: qty 270, 90d supply, fill #1

## 2022-02-19 DIAGNOSIS — H26491 Other secondary cataract, right eye: Secondary | ICD-10-CM | POA: Diagnosis not present

## 2022-02-24 ENCOUNTER — Other Ambulatory Visit (HOSPITAL_BASED_OUTPATIENT_CLINIC_OR_DEPARTMENT_OTHER): Payer: Self-pay

## 2022-02-27 ENCOUNTER — Other Ambulatory Visit (HOSPITAL_BASED_OUTPATIENT_CLINIC_OR_DEPARTMENT_OTHER): Payer: Self-pay

## 2022-03-11 DIAGNOSIS — M810 Age-related osteoporosis without current pathological fracture: Secondary | ICD-10-CM | POA: Diagnosis not present

## 2022-04-03 NOTE — Progress Notes (Signed)
?Charlann Boxer D.O. ?[facility] ?[place] ?Phone: [phone] ?Subjective:   ?I, Tracey [name], am serving as a Education administrator for Dr. Hulan Saas. ?This visit occurred during the SARS-CoV-2 public health emergency.  Safety protocols were in place, including screening questions prior to the visit, additional usage of staff PPE, and extensive cleaning of exam room while observing appropriate contact time as indicated for disinfecting solutions.  ? ?I'm seeing this patient by the request  of:  [name], MD ? ?CC: Neck and back pain follow-up ? ?[id number]  ?Tracey [name] is a 71 y.o. female coming in with complaint of back and neck pain. OMT on 02/11/2022. Patient states everything the same. No new complaints. ? ?Medications patient has been prescribed: Cymbalta ? ?Taking: ? ? ?  ? ? ? ? ?Reviewed prior external information including notes and imaging from previsou exam, outside providers and external EMR if available.  ? ?As well as notes that were available from care everywhere and other healthcare systems. ? ?Past medical history, social, surgical and family history all reviewed in electronic medical record.  No pertanent information unless stated regarding to the chief complaint.  ? ?Past Medical History:  ?Diagnosis Date  ? CAD (coronary artery disease)   ? Closed hip fracture ([place]) 03/2016  ? RT HIP  ? HSV-1 (herpes simplex virus 1) infection   ? Hypertension   ? Osteopenia   ? Osteoporosis   ? h/o  ? Pneumonia   ? Skin cancer   ? multiple skin cancers  ?  ?Allergies  ?Allergen Reactions  ? Cefaclor Hives  ? Abaloparatide   ?  Other reaction(s): headaches, lethargy, fatigue  ? Adhesive [Tape]   ? Fosamax [Alendronate Sodium] Nausea Only  ?  Gi upset  ? Neomycin-Bacitracin Zn-Polymyx Rash  ? ? ? ?Review of Systems: ? No headache, visual changes, nausea, vomiting, diarrhea, constipation, dizziness, abdominal pain, skin rash, fevers, chills, night sweats, weight loss,  swollen lymph nodes, body aches, joint swelling, chest pain, shortness of breath, mood changes. POSITIVE muscle aches ? ?Objective  ?Blood pressure 108/72, pulse 81, height '5\' 5"'$  (1.651 m), weight 109 lb (49.4 kg), SpO2 97 %. ?  ?General: No apparent distress alert and oriented x3 mood and affect normal, dressed appropriately.  ?HEENT: Pupils equal, extraocular movements intact  ?Respiratory: Patient's speak in full sentences and does not appear short of breath  ?Cardiovascular: No lower extremity edema, non tender, no erythema  ?Neck exam does have some mild loss of lordosis, tightness noted.  C2-C3 area with some difficulty with sidebending to the right and rotation to the left. ? ?Osteopathic findings ? ?C5 flexed rotated and side bent left ?T3 extended rotated and side bent right inhaled rib ?T9 extended rotated and side bent left ?L2 flexed rotated and side bent right ?Sacrum right on right ? ? ? ? ?  ?Assessment and Plan: ? ?Lumbar radiculopathy ?Stable at the moment, responding extremely well to the Cymbalta, can increase dose still if necessary.  We discussed with patient we will continue to monitor.  Continue to have patient be active.  Follow-up again in 6 to 8 weeks ?  ? ?Nonallopathic problems ? ?Decision today to treat with OMT was based on Physical Exam ? ?After verbal consent patient was treated with  ME, FPR techniques in cervical, rib, thoracic, lumbar, and sacral  areas avoided HVLA with patient's chronic comorbidities ? ?Patient tolerated the procedure well with improvement in symptoms ? ?Patient  given exercises, stretches and lifestyle modifications ? ?See medications in patient instructions if given ? ?Patient will follow up in 4-8 weeks ? ?  ? ?The above documentation has been reviewed and is accurate and complete [name], DO ? ? ? ?  ? ? Note: This dictation was prepared with Dragon dictation along with smaller phrase technology. Any transcriptional errors that result from this process  are unintentional.    ?  ?  ? ?

## 2022-04-07 ENCOUNTER — Ambulatory Visit: Payer: PPO | Admitting: Family Medicine

## 2022-04-07 VITALS — BP 108/72 | HR 81 | Ht 65.0 in | Wt 109.0 lb

## 2022-04-07 DIAGNOSIS — M9903 Segmental and somatic dysfunction of lumbar region: Secondary | ICD-10-CM

## 2022-04-07 DIAGNOSIS — M9908 Segmental and somatic dysfunction of rib cage: Secondary | ICD-10-CM | POA: Diagnosis not present

## 2022-04-07 DIAGNOSIS — M9902 Segmental and somatic dysfunction of thoracic region: Secondary | ICD-10-CM

## 2022-04-07 DIAGNOSIS — M9904 Segmental and somatic dysfunction of sacral region: Secondary | ICD-10-CM

## 2022-04-07 DIAGNOSIS — M9901 Segmental and somatic dysfunction of cervical region: Secondary | ICD-10-CM | POA: Diagnosis not present

## 2022-04-07 DIAGNOSIS — M5416 Radiculopathy, lumbar region: Secondary | ICD-10-CM | POA: Diagnosis not present

## 2022-04-07 NOTE — Assessment & Plan Note (Signed)
Stable at the moment, responding extremely well to the Cymbalta, can increase dose still if necessary.  We discussed with patient we will continue to monitor.  Continue to have patient be active.  Follow-up again in 6 to 8 weeks ?

## 2022-04-07 NOTE — Patient Instructions (Signed)
Good to see you! ?Good luck with the rest of the house remodeling ?See you again in 2 months ?

## 2022-04-10 ENCOUNTER — Other Ambulatory Visit (HOSPITAL_BASED_OUTPATIENT_CLINIC_OR_DEPARTMENT_OTHER): Payer: Self-pay

## 2022-04-17 DIAGNOSIS — D485 Neoplasm of uncertain behavior of skin: Secondary | ICD-10-CM | POA: Diagnosis not present

## 2022-04-17 DIAGNOSIS — D2271 Melanocytic nevi of right lower limb, including hip: Secondary | ICD-10-CM | POA: Diagnosis not present

## 2022-04-17 DIAGNOSIS — Z85820 Personal history of malignant melanoma of skin: Secondary | ICD-10-CM | POA: Diagnosis not present

## 2022-04-17 DIAGNOSIS — L820 Inflamed seborrheic keratosis: Secondary | ICD-10-CM | POA: Diagnosis not present

## 2022-04-17 DIAGNOSIS — D224 Melanocytic nevi of scalp and neck: Secondary | ICD-10-CM | POA: Diagnosis not present

## 2022-04-17 DIAGNOSIS — D1801 Hemangioma of skin and subcutaneous tissue: Secondary | ICD-10-CM | POA: Diagnosis not present

## 2022-04-17 DIAGNOSIS — D2272 Melanocytic nevi of left lower limb, including hip: Secondary | ICD-10-CM | POA: Diagnosis not present

## 2022-04-17 DIAGNOSIS — Z85828 Personal history of other malignant neoplasm of skin: Secondary | ICD-10-CM | POA: Diagnosis not present

## 2022-04-17 DIAGNOSIS — C44519 Basal cell carcinoma of skin of other part of trunk: Secondary | ICD-10-CM | POA: Diagnosis not present

## 2022-04-17 DIAGNOSIS — L918 Other hypertrophic disorders of the skin: Secondary | ICD-10-CM | POA: Diagnosis not present

## 2022-04-17 DIAGNOSIS — D225 Melanocytic nevi of trunk: Secondary | ICD-10-CM | POA: Diagnosis not present

## 2022-05-08 ENCOUNTER — Other Ambulatory Visit: Payer: Self-pay | Admitting: Family Medicine

## 2022-05-08 ENCOUNTER — Other Ambulatory Visit (HOSPITAL_BASED_OUTPATIENT_CLINIC_OR_DEPARTMENT_OTHER): Payer: Self-pay

## 2022-05-08 MED ORDER — DULOXETINE HCL 20 MG PO CPEP
20.0000 mg | ORAL_CAPSULE | Freq: Every day | ORAL | 0 refills | Status: DC
  Filled 2022-05-08: qty 90, 90d supply, fill #0

## 2022-05-08 MED ORDER — METHOCARBAMOL 500 MG PO TABS
500.0000 mg | ORAL_TABLET | Freq: Three times a day (TID) | ORAL | 1 refills | Status: DC | PRN
  Filled 2022-05-08: qty 90, 30d supply, fill #0
  Filled 2022-08-06: qty 90, 30d supply, fill #1

## 2022-06-05 NOTE — Progress Notes (Signed)
Tracey [name] [address] [place] Phone: [phone] Subjective:   Tracey [name], am serving as a scribe for Dr. Hulan Saas.  I'm seeing this patient by the request  of:  [name], MD  CC: back and neck pain   [id number]  Tracey [name] is a 71 y.o. female coming in with complaint of back and neck pain. OMT on 04/07/2022. Patient states same per usual. No new complaints.  Has had a headache recently on the left side of the temporal area.  Seems to be worse with certain movements.  Medications patient has been prescribed: Cymbalta Robaxin  Taking:         Reviewed prior external information including notes and imaging from previsou exam, outside providers and external EMR if available.   As well as notes that were available from care everywhere and other healthcare systems.  Past medical history, social, surgical and family history all reviewed in electronic medical record.  No pertanent information unless stated regarding to the chief complaint.   Past Medical History:  Diagnosis Date   CAD (coronary artery disease)    Closed hip fracture ([place]) 03/2016   RT HIP   HSV-1 (herpes simplex virus 1) infection    Hypertension    Osteopenia    Osteoporosis    h/o   Pneumonia    Skin cancer    multiple skin cancers    Allergies  Allergen Reactions   Cefaclor Hives   Abaloparatide     Other reaction(s): headaches, lethargy, fatigue   Adhesive [Tape]    Fosamax [Alendronate Sodium] Nausea Only    Gi upset   Neomycin-Bacitracin Zn-Polymyx Rash     Review of Systems:  No headache, visual changes, nausea, vomiting, diarrhea, constipation, dizziness, abdominal pain, skin rash, fevers, chills, night sweats, weight loss, swollen lymph nodes, body aches, joint swelling, chest pain, shortness of breath, mood changes. POSITIVE muscle aches  Objective  Blood pressure 124/68, pulse 78, height $RemoveBe'5\' 5"'katEehdMU$  (1.651 m), weight  107 lb (48.5 kg), SpO2 99 %.   General: No apparent distress alert and oriented x3 mood and affect normal, dressed appropriately.  HEENT: Pupils equal, extraocular movements intact  Respiratory: Patient's speak in full sentences and does not appear short of breath  Cardiovascular: No lower extremity edema, non tender, no erythema  Gait normal MSK:  Back back does have some loss of lordosis still.  Tightness with FABER test bilaterally.  Negative straight leg test at the moment though.  Worsening pain with extension of the back Hand is minorly tender to palpation over the temporal area on the left side.  Osteopathic findings  C2 flexed rotated and side bent right C6 flexed rotated and side bent left T3 extended rotated and side bent right inhaled rib T6 extended rotated and side bent left L1 flexed rotated and side bent right L4 flexed rotated and side bent left Sacrum right on right     Assessment and Plan:  Lumbar radiculopathy Patient does have some lumbar radiculopathy previously but seems to be doing relatively well at the moment. Still needing to work on her osteoporosis.  Continue with pain and muscle energy for the osteopathic manipulation.  Follow-up again in 6 to 8 weeks  Left temporal headache We will get ESR, discussed with the potential for grinding teeth and would get a mouthguard, any visual changes to seek medical attention.  Over-the-counter medications otherwise.    Nonallopathic problems  Decision today to  treat with OMT was based on Physical Exam  After verbal consent patient was treated with , ME, FPR techniques in cervical, rib, thoracic, lumbar, and sacral  areas  Patient tolerated the procedure well with improvement in symptoms  Patient given exercises, stretches and lifestyle modifications  See medications in patient instructions if given  Patient will follow up in 4-8 weeks     The above documentation has been reviewed and is accurate and  complete [name], DO         Note: This dictation was prepared with Dragon dictation along with smaller phrase technology. Any transcriptional errors that result from this process are unintentional.

## 2022-06-11 ENCOUNTER — Encounter: Payer: Self-pay | Admitting: Family Medicine

## 2022-06-11 ENCOUNTER — Ambulatory Visit: Payer: PPO | Admitting: Family Medicine

## 2022-06-11 VITALS — BP 124/68 | HR 78 | Ht 65.0 in | Wt 107.0 lb

## 2022-06-11 DIAGNOSIS — M5416 Radiculopathy, lumbar region: Secondary | ICD-10-CM

## 2022-06-11 DIAGNOSIS — M222X1 Patellofemoral disorders, right knee: Secondary | ICD-10-CM

## 2022-06-11 DIAGNOSIS — M2550 Pain in unspecified joint: Secondary | ICD-10-CM

## 2022-06-11 DIAGNOSIS — M9908 Segmental and somatic dysfunction of rib cage: Secondary | ICD-10-CM | POA: Diagnosis not present

## 2022-06-11 DIAGNOSIS — M9904 Segmental and somatic dysfunction of sacral region: Secondary | ICD-10-CM

## 2022-06-11 DIAGNOSIS — M9902 Segmental and somatic dysfunction of thoracic region: Secondary | ICD-10-CM

## 2022-06-11 DIAGNOSIS — M9901 Segmental and somatic dysfunction of cervical region: Secondary | ICD-10-CM

## 2022-06-11 DIAGNOSIS — M9903 Segmental and somatic dysfunction of lumbar region: Secondary | ICD-10-CM

## 2022-06-11 DIAGNOSIS — R519 Headache, unspecified: Secondary | ICD-10-CM

## 2022-06-11 DIAGNOSIS — M222X2 Patellofemoral disorders, left knee: Secondary | ICD-10-CM

## 2022-06-11 LAB — CBC WITH DIFFERENTIAL/PLATELET
Basophils Absolute: 0.1 10*3/uL (ref 0.0–0.1)
Basophils Relative: 1.3 % (ref 0.0–3.0)
Eosinophils Absolute: 0.2 10*3/uL (ref 0.0–0.7)
Eosinophils Relative: 4.8 % (ref 0.0–5.0)
HCT: 42 % (ref 36.0–46.0)
Hemoglobin: 13.9 g/dL (ref 12.0–15.0)
Lymphocytes Relative: 26.8 % (ref 12.0–46.0)
Lymphs Abs: 1.3 10*3/uL (ref 0.7–4.0)
MCHC: 33.1 g/dL (ref 30.0–36.0)
MCV: 86.5 fl (ref 78.0–100.0)
Monocytes Absolute: 0.6 10*3/uL (ref 0.1–1.0)
Monocytes Relative: 12.1 % — ABNORMAL HIGH (ref 3.0–12.0)
Neutro Abs: 2.8 10*3/uL (ref 1.4–7.7)
Neutrophils Relative %: 55 % (ref 43.0–77.0)
Platelets: 201 10*3/uL (ref 150.0–400.0)
RBC: 4.85 Mil/uL (ref 3.87–5.11)
RDW: 14.1 % (ref 11.5–15.5)
WBC: 5 10*3/uL (ref 4.0–10.5)

## 2022-06-11 LAB — SEDIMENTATION RATE: Sed Rate: 7 mm/hr (ref 0–30)

## 2022-06-11 NOTE — Assessment & Plan Note (Signed)
We will get ESR, discussed with the potential for grinding teeth and would get a mouthguard, any visual changes to seek medical attention.  Over-the-counter medications otherwise.

## 2022-06-11 NOTE — Patient Instructions (Addendum)
Left knee brace Do prescribed exercises at least 3x a week Labs today See me in 6-8 weeks

## 2022-06-11 NOTE — Assessment & Plan Note (Signed)
Patient does have some lumbar radiculopathy previously but seems to be doing relatively well at the moment. Still needing to work on her osteoporosis.  Continue with pain and muscle energy for the osteopathic manipulation.  Follow-up again in 6 to 8 weeks

## 2022-06-12 NOTE — Assessment & Plan Note (Signed)
Exacerbation on the left side.  Discussed which activities to do which ones to avoid.  Tru pull lite given that I think will be helpful for certain activities.  Follow-up again in 6 to 8 weeks

## 2022-07-03 ENCOUNTER — Other Ambulatory Visit (HOSPITAL_BASED_OUTPATIENT_CLINIC_OR_DEPARTMENT_OTHER): Payer: Self-pay

## 2022-07-17 NOTE — Progress Notes (Signed)
[place] Phone: [phone] Subjective:   Tracey [name], am serving as a scribe for Dr. Hulan Saas.  I'm seeing this patient by the request  of:  [name], MD  CC: back and neck pain follow up   [id number]  Tracey [name] is a 71 y.o. female coming in with complaint of back and neck pain. OMT on 06/11/2022. Patient states that she is doing well.   Medications patient has been prescribed: Robaxin Cymbalta  Taking:         Reviewed prior external information including notes and imaging from previsou exam, outside providers and external EMR if available.   As well as notes that were available from care everywhere and other healthcare systems.  Past medical history, social, surgical and family history all reviewed in electronic medical record.  [name] pertanent information unless stated regarding to the chief complaint.   Past Medical History:  Diagnosis Date   CAD (coronary artery disease)    Closed hip fracture ([place]) 03/2016   RT HIP   HSV-1 (herpes simplex virus 1) infection    Hypertension    Osteopenia    Osteoporosis    h/o   Pneumonia    Skin cancer    multiple skin cancers    Allergies  Allergen Reactions   Cefaclor Hives   Abaloparatide     Other reaction(s): headaches, lethargy, fatigue   Adhesive [Tape]    Fosamax [Alendronate Sodium] Nausea Only    Gi upset   Neomycin-Bacitracin Zn-Polymyx Rash     Review of Systems:  [name] headache, visual changes, nausea, vomiting, diarrhea, constipation, dizziness, abdominal pain, skin rash, fevers, chills, night sweats, weight loss, swollen lymph nodes, body aches, joint swelling, chest pain, shortness of breath, mood changes. POSITIVE muscle aches  Objective  Blood pressure 112/80, pulse 72, height '5\' 5"'$  (1.651 m), weight 107 lb (48.5 kg), SpO2 98 %.   General: [name] apparent distress alert and oriented x3 mood and affect normal,  dressed appropriately.  HEENT: Pupils equal, extraocular movements intact  Respiratory: Patient's speak in full sentences and does not appear short of breath  Cardiovascular: [name] lower extremity edema, non tender, [name] erythema  Gait normal  MSK:  Back low back exam does have loss of lordosis tightness noted in the paraspinal musculature. Patient does have some mild tightness with FABER test as well.  Osteopathic findings  C2 flexed rotated and side bent right C5 flexed rotated and side bent left T3 extended rotated and side bent right inhaled rib T8 extended rotated and side bent left T11 extended rotated side bent right L2 flexed rotated and side bent right Sacrum right on right     Assessment and Plan:  Lumbar radiculopathy We discussed which activities to do which ones to avoid.  Discussed posture and ergonomics.  Increase activity slowly otherwise.  Follow-up with me again in 6 to 8 weeks.    Nonallopathic problems  Decision today to treat with OMT was based on Physical Exam  After verbal consent patient was treated with HVLA, ME, FPR techniques in cervical, rib, thoracic, lumbar, and sacral  areas  Patient tolerated the procedure well with improvement in symptoms  Patient given exercises, stretches and lifestyle modifications  See medications in patient instructions if given  Patient will follow up in 4-8 weeks    The above documentation has been reviewed and is accurate and complete [name], DO  Note: This dictation was prepared with Dragon dictation along with smaller phrase technology. Any transcriptional errors that result from this process are unintentional.

## 2022-07-21 ENCOUNTER — Other Ambulatory Visit (HOSPITAL_BASED_OUTPATIENT_CLINIC_OR_DEPARTMENT_OTHER): Payer: Self-pay

## 2022-07-21 DIAGNOSIS — B078 Other viral warts: Secondary | ICD-10-CM | POA: Diagnosis not present

## 2022-07-21 DIAGNOSIS — Z85820 Personal history of malignant melanoma of skin: Secondary | ICD-10-CM | POA: Diagnosis not present

## 2022-07-21 DIAGNOSIS — C4441 Basal cell carcinoma of skin of scalp and neck: Secondary | ICD-10-CM | POA: Diagnosis not present

## 2022-07-21 DIAGNOSIS — D2272 Melanocytic nevi of left lower limb, including hip: Secondary | ICD-10-CM | POA: Diagnosis not present

## 2022-07-21 DIAGNOSIS — D2262 Melanocytic nevi of left upper limb, including shoulder: Secondary | ICD-10-CM | POA: Diagnosis not present

## 2022-07-21 DIAGNOSIS — L570 Actinic keratosis: Secondary | ICD-10-CM | POA: Diagnosis not present

## 2022-07-21 DIAGNOSIS — L910 Hypertrophic scar: Secondary | ICD-10-CM | POA: Diagnosis not present

## 2022-07-21 DIAGNOSIS — C44519 Basal cell carcinoma of skin of other part of trunk: Secondary | ICD-10-CM | POA: Diagnosis not present

## 2022-07-21 DIAGNOSIS — L821 Other seborrheic keratosis: Secondary | ICD-10-CM | POA: Diagnosis not present

## 2022-07-21 DIAGNOSIS — Z85828 Personal history of other malignant neoplasm of skin: Secondary | ICD-10-CM | POA: Diagnosis not present

## 2022-07-21 DIAGNOSIS — D485 Neoplasm of uncertain behavior of skin: Secondary | ICD-10-CM | POA: Diagnosis not present

## 2022-07-21 DIAGNOSIS — D225 Melanocytic nevi of trunk: Secondary | ICD-10-CM | POA: Diagnosis not present

## 2022-07-22 ENCOUNTER — Encounter: Payer: Self-pay | Admitting: Family Medicine

## 2022-07-22 ENCOUNTER — Ambulatory Visit: Payer: PPO | Admitting: Family Medicine

## 2022-07-22 VITALS — BP 112/80 | HR 72 | Ht 65.0 in | Wt 107.0 lb

## 2022-07-22 DIAGNOSIS — M9904 Segmental and somatic dysfunction of sacral region: Secondary | ICD-10-CM

## 2022-07-22 DIAGNOSIS — M9903 Segmental and somatic dysfunction of lumbar region: Secondary | ICD-10-CM | POA: Diagnosis not present

## 2022-07-22 DIAGNOSIS — M5416 Radiculopathy, lumbar region: Secondary | ICD-10-CM | POA: Diagnosis not present

## 2022-07-22 DIAGNOSIS — M9908 Segmental and somatic dysfunction of rib cage: Secondary | ICD-10-CM | POA: Diagnosis not present

## 2022-07-22 DIAGNOSIS — M9902 Segmental and somatic dysfunction of thoracic region: Secondary | ICD-10-CM | POA: Diagnosis not present

## 2022-07-22 DIAGNOSIS — M9901 Segmental and somatic dysfunction of cervical region: Secondary | ICD-10-CM | POA: Diagnosis not present

## 2022-07-22 NOTE — Patient Instructions (Signed)
See me in 6-8 weeks [place] lake[place]

## 2022-07-22 NOTE — Assessment & Plan Note (Addendum)
We discussed which activities to do which ones to avoid.  Discussed posture and ergonomics.  Increase activity slowly otherwise.  Follow-up with me again in 6 to 8 weeks.  Medications include gabapentin, methocarbamol, duloxetine

## 2022-08-06 ENCOUNTER — Other Ambulatory Visit (HOSPITAL_BASED_OUTPATIENT_CLINIC_OR_DEPARTMENT_OTHER): Payer: Self-pay

## 2022-08-06 ENCOUNTER — Other Ambulatory Visit: Payer: Self-pay | Admitting: Family Medicine

## 2022-08-06 MED ORDER — DULOXETINE HCL 20 MG PO CPEP
20.0000 mg | ORAL_CAPSULE | Freq: Every day | ORAL | 0 refills | Status: DC
  Filled 2022-08-06: qty 90, 90d supply, fill #0

## 2022-08-11 DIAGNOSIS — E7800 Pure hypercholesterolemia, unspecified: Secondary | ICD-10-CM | POA: Diagnosis not present

## 2022-08-11 DIAGNOSIS — M810 Age-related osteoporosis without current pathological fracture: Secondary | ICD-10-CM | POA: Diagnosis not present

## 2022-08-11 DIAGNOSIS — I5189 Other ill-defined heart diseases: Secondary | ICD-10-CM | POA: Diagnosis not present

## 2022-08-11 DIAGNOSIS — Z0000 Encounter for general adult medical examination without abnormal findings: Secondary | ICD-10-CM | POA: Diagnosis not present

## 2022-08-11 DIAGNOSIS — N1831 Chronic kidney disease, stage 3a: Secondary | ICD-10-CM | POA: Diagnosis not present

## 2022-08-11 DIAGNOSIS — I10 Essential (primary) hypertension: Secondary | ICD-10-CM | POA: Diagnosis not present

## 2022-08-11 DIAGNOSIS — I2510 Atherosclerotic heart disease of native coronary artery without angina pectoris: Secondary | ICD-10-CM | POA: Diagnosis not present

## 2022-08-28 NOTE — Progress Notes (Signed)
[place] Phone: [phone] Subjective:   Tracey Tracey [name], am serving as a scribe for Dr. Hulan Saas.  I'm seeing this patient by the request  of:  [name], MD  CC: Back and neck pain follow-up, knee pain follow-up  [id number]  Tracey Tracey [name] is a 71 y.o. female coming in with complaint of back and neck pain. OMT on 07/22/2022. Patient states that she is doing well. L knee is giving her issues. Wears brace for pain relief.   Medications patient has been prescribed: Cymbalta  Taking:         Reviewed prior external information including notes and imaging from previsou exam, outside providers and external EMR if available.   As well as notes that were available from care everywhere and other healthcare systems.  Past medical history, social, surgical and family history all reviewed in electronic medical record.  Tracey [name] pertanent information unless stated regarding to the chief complaint.   Past Medical History:  Diagnosis Date   CAD (coronary artery disease)    Closed hip fracture ([place]) 03/2016   RT HIP   HSV-1 (herpes simplex virus 1) infection    Hypertension    Osteopenia    Osteoporosis    h/o   Pneumonia    Skin cancer    multiple skin cancers    Allergies  Allergen Reactions   Cefaclor Hives   Abaloparatide     Other reaction(s): headaches, lethargy, fatigue   Adhesive [Tape]    Fosamax [Alendronate Sodium] Nausea Only    Gi upset   Neomycin-Bacitracin Zn-Polymyx Rash     Review of Systems:  Tracey [name] headache, visual changes, nausea, vomiting, diarrhea, constipation, dizziness, abdominal pain, skin rash, fevers, chills, night sweats, weight loss, swollen lymph nodes, body aches, joint swelling, chest pain, shortness of breath, mood changes. POSITIVE muscle aches  Objective  Pulse 70, height '5\' 5"'$  (1.651 m), weight 108 lb (49 kg).   General: Tracey [name] apparent distress alert and  oriented x3 mood and affect normal, dressed appropriately.  HEENT: Pupils equal, extraocular movements intact  Respiratory: Patient's speak in full sentences and does not appear short of breath  Cardiovascular: Tracey [name] lower extremity edema, non tender, Tracey [name] erythema  Gait normal overall the patient seems to still have some crepitus noted. MSK:  Back does have loss of lordosis.  Tenderness to palpation in the paraspinal musculature.  Does have some loss of lordosis of the lumbar spine.  Osteopathic findings  C2 flexed rotated and side bent right C6 flexed rotated and side bent left T3 extended rotated and side bent right inhaled rib T9 extended rotated and side bent left L2 flexed rotated and side bent right Sacrum right on right       Assessment and Plan:  Lumbar radiculopathy Likely Tracey [name] radicular symptoms at this moment.  Patient is responding to manipulation.  Would focus on more muscle energy.  Still will monitor patient's bowel health.  Follow-up again in 6 to 8 weeks    Nonallopathic problems  Decision today to treat with OMT was based on Physical Exam  After verbal consent patient was treated with  ME, FPR techniques in cervical, rib, thoracic, lumbar, and sacral  areas  Patient tolerated the procedure well with improvement in symptoms  Patient given exercises, stretches and lifestyle modifications  See medications in patient instructions if given  Patient will follow up in 4-8 weeks     The above documentation  has been reviewed and is accurate and complete [name], DO         Note: This dictation was prepared with Dragon dictation along with smaller phrase technology. Any transcriptional errors that result from this process are unintentional.

## 2022-09-02 ENCOUNTER — Ambulatory Visit: Payer: PPO | Admitting: Family Medicine

## 2022-09-02 VITALS — HR 70 | Ht 65.0 in | Wt 108.0 lb

## 2022-09-02 DIAGNOSIS — M9908 Segmental and somatic dysfunction of rib cage: Secondary | ICD-10-CM

## 2022-09-02 DIAGNOSIS — M9902 Segmental and somatic dysfunction of thoracic region: Secondary | ICD-10-CM

## 2022-09-02 DIAGNOSIS — M9901 Segmental and somatic dysfunction of cervical region: Secondary | ICD-10-CM

## 2022-09-02 DIAGNOSIS — M9904 Segmental and somatic dysfunction of sacral region: Secondary | ICD-10-CM

## 2022-09-02 DIAGNOSIS — M222X2 Patellofemoral disorders, left knee: Secondary | ICD-10-CM | POA: Diagnosis not present

## 2022-09-02 DIAGNOSIS — M5416 Radiculopathy, lumbar region: Secondary | ICD-10-CM

## 2022-09-02 DIAGNOSIS — M222X1 Patellofemoral disorders, right knee: Secondary | ICD-10-CM

## 2022-09-02 DIAGNOSIS — M9903 Segmental and somatic dysfunction of lumbar region: Secondary | ICD-10-CM | POA: Diagnosis not present

## 2022-09-02 NOTE — Assessment & Plan Note (Signed)
Likely no radicular symptoms at this moment.  Patient is responding to manipulation.  Would focus on more muscle energy.  Still will monitor patient's bowel health.  Follow-up again in 6 to 8 weeks

## 2022-09-02 NOTE — Patient Instructions (Addendum)
Hubby shoulder do well See me in 6-8 week

## 2022-09-02 NOTE — Assessment & Plan Note (Signed)
To monitor for worsening pain I do think injections could be beneficial.  Still avoiding anything like surgical intervention if possible.  I do believe patient can do well with conservative therapy.  Follow-up with me again in 6 to 8 weeks.

## 2022-09-09 ENCOUNTER — Other Ambulatory Visit (HOSPITAL_BASED_OUTPATIENT_CLINIC_OR_DEPARTMENT_OTHER): Payer: Self-pay

## 2022-09-09 DIAGNOSIS — Z85820 Personal history of malignant melanoma of skin: Secondary | ICD-10-CM | POA: Diagnosis not present

## 2022-09-09 DIAGNOSIS — C44319 Basal cell carcinoma of skin of other parts of face: Secondary | ICD-10-CM | POA: Diagnosis not present

## 2022-09-09 DIAGNOSIS — Z85828 Personal history of other malignant neoplasm of skin: Secondary | ICD-10-CM | POA: Diagnosis not present

## 2022-09-09 MED ORDER — DOXYCYCLINE HYCLATE 100 MG PO CAPS
100.0000 mg | ORAL_CAPSULE | Freq: Two times a day (BID) | ORAL | 0 refills | Status: DC
  Filled 2022-09-09: qty 10, 5d supply, fill #0

## 2022-09-30 ENCOUNTER — Other Ambulatory Visit (HOSPITAL_BASED_OUTPATIENT_CLINIC_OR_DEPARTMENT_OTHER): Payer: Self-pay

## 2022-09-30 ENCOUNTER — Other Ambulatory Visit: Payer: Self-pay | Admitting: Family Medicine

## 2022-10-01 ENCOUNTER — Other Ambulatory Visit (HOSPITAL_BASED_OUTPATIENT_CLINIC_OR_DEPARTMENT_OTHER): Payer: Self-pay

## 2022-10-01 MED ORDER — AMLODIPINE BESYLATE 5 MG PO TABS
5.0000 mg | ORAL_TABLET | Freq: Every day | ORAL | 3 refills | Status: AC
  Filled 2022-10-01 (×4): qty 90, 90d supply, fill #0
  Filled 2023-01-14 – 2023-01-15 (×2): qty 90, 90d supply, fill #1

## 2022-10-01 MED ORDER — FOLIC ACID 1 MG PO TABS
1.0000 mg | ORAL_TABLET | Freq: Every day | ORAL | 3 refills | Status: AC
  Filled 2022-10-01: qty 90, 90d supply, fill #0
  Filled 2023-01-14 – 2023-01-15 (×2): qty 90, 90d supply, fill #1

## 2022-10-02 ENCOUNTER — Other Ambulatory Visit (HOSPITAL_BASED_OUTPATIENT_CLINIC_OR_DEPARTMENT_OTHER): Payer: Self-pay

## 2022-10-03 ENCOUNTER — Other Ambulatory Visit (HOSPITAL_BASED_OUTPATIENT_CLINIC_OR_DEPARTMENT_OTHER): Payer: Self-pay

## 2022-10-03 MED ORDER — METHOCARBAMOL 500 MG PO TABS
500.0000 mg | ORAL_TABLET | Freq: Three times a day (TID) | ORAL | 1 refills | Status: DC | PRN
  Filled 2022-10-03: qty 90, 30d supply, fill #0
  Filled 2023-01-14 – 2023-01-15 (×2): qty 90, 30d supply, fill #1

## 2022-10-04 ENCOUNTER — Other Ambulatory Visit (HOSPITAL_BASED_OUTPATIENT_CLINIC_OR_DEPARTMENT_OTHER): Payer: Self-pay

## 2022-10-15 NOTE — Progress Notes (Unsigned)
[place] Phone: [phone] Subjective:    I'm seeing this patient by the request  of:  [name], MD  CC: Back and neck pain follow-up  [id number]  Tracey [name] is a 71 y.o. female coming in with complaint of back and neck pain. OMT 09/02/2022. Also f/u for B knee pain. Patient states knees are doing well and here for routine OMT, but back is tight since husband had his shoulder surgery.   Medications patient has been prescribed: Cymbalta, Robaxin  Taking: yes          Reviewed prior external information including notes and imaging from previsou exam, outside providers and external EMR if available.   As well as notes that were available from care everywhere and other healthcare systems.  Past medical history, social, surgical and family history all reviewed in electronic medical record.  No pertanent information unless stated regarding to the chief complaint.   Past Medical History:  Diagnosis Date   CAD (coronary artery disease)    Closed hip fracture ([place]) 03/2016   RT HIP   HSV-1 (herpes simplex virus 1) infection    Hypertension    Osteopenia    Osteoporosis    h/o   Pneumonia    Skin cancer    multiple skin cancers    Allergies  Allergen Reactions   Cefaclor Hives   Abaloparatide     Other reaction(s): headaches, lethargy, fatigue   Adhesive [Tape]    Fosamax [Alendronate Sodium] Nausea Only    Gi upset   Neomycin-Bacitracin Zn-Polymyx Rash     Review of Systems:  No headache, visual changes, nausea, vomiting, diarrhea, constipation, dizziness, abdominal pain, skin rash, fevers, chills, night sweats, weight loss, swollen lymph nodes, body aches, joint swelling, chest pain, shortness of breath, mood changes. POSITIVE muscle aches  Objective  Blood pressure 110/70, pulse 69, height '5\' 5"'$  (1.651 m), weight 109 lb (49.4 kg), SpO2 99 %.   General: No apparent distress  alert and oriented x3 mood and affect normal, dressed appropriately.  HEENT: Pupils equal, extraocular movements intact  Respiratory: Patient's speak in full sentences and does not appear short of breath  Cardiovascular: No lower extremity edema, non tender, no erythema  Mild loss lordosis.  Patient does have some tightness noted in the sacroiliac joints bilaterally.  Negative straight leg test noted.  Patient has 5-5 strength in the upper extremities.  Osteopathic findings C5 flexed rotated and side bent left T9 extended rotated and side bent left L2 flexed rotated and side bent right L4 flexed rotated and side bent left Sacrum right on right       Assessment and Plan:  Sacroiliac dysfunction Continues respond to more of the muscular injury.  Discussed icing regimen and home exercises.  Which activities to do and which ones to avoid.  Increase activity slowly over the course of the next several weeks again.  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  Follow-up again 6 to 8 weeks    Nonallopathic problems  Decision today to treat with OMT was based on Physical Exam  After verbal consent patient was treated with  ME, FPR techniques in cervical, rib, thoracic, lumbar, and sacral  areas  Patient tolerated the procedure well with improvement in symptoms  Patient given exercises, stretches and lifestyle modifications  See medications in patient instructions if given  Patient will  follow up in 4-8 weeks     The above documentation has been reviewed and is accurate and complete [name], DO         Note: This dictation was prepared with Dragon dictation along with smaller phrase technology. Any transcriptional errors that result from this process are unintentional.

## 2022-10-16 ENCOUNTER — Ambulatory Visit: Payer: PPO | Admitting: Family Medicine

## 2022-10-16 VITALS — BP 110/70 | HR 69 | Ht 65.0 in | Wt 109.0 lb

## 2022-10-16 DIAGNOSIS — M9903 Segmental and somatic dysfunction of lumbar region: Secondary | ICD-10-CM | POA: Diagnosis not present

## 2022-10-16 DIAGNOSIS — M9901 Segmental and somatic dysfunction of cervical region: Secondary | ICD-10-CM | POA: Diagnosis not present

## 2022-10-16 DIAGNOSIS — M9904 Segmental and somatic dysfunction of sacral region: Secondary | ICD-10-CM

## 2022-10-16 DIAGNOSIS — M533 Sacrococcygeal disorders, not elsewhere classified: Secondary | ICD-10-CM

## 2022-10-16 DIAGNOSIS — M9902 Segmental and somatic dysfunction of thoracic region: Secondary | ICD-10-CM

## 2022-10-16 NOTE — Patient Instructions (Addendum)
Good to see you  I am glad hubby is doing so well Follow up in 6-8 weeks

## 2022-10-16 NOTE — Assessment & Plan Note (Addendum)
Continues respond to more of the muscular injury.  Discussed icing regimen and home exercises.  Which activities to do and which ones to avoid.  Increase activity slowly over the course of the next several weeks again.  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  Follow-up again 6 to 8 weeks continue with the medication of Cymbalta 20 mg

## 2022-10-17 ENCOUNTER — Encounter: Payer: Self-pay | Admitting: Interventional Cardiology

## 2022-10-17 ENCOUNTER — Other Ambulatory Visit: Payer: Self-pay | Admitting: Obstetrics & Gynecology

## 2022-10-17 DIAGNOSIS — Z1231 Encounter for screening mammogram for malignant neoplasm of breast: Secondary | ICD-10-CM

## 2022-10-25 ENCOUNTER — Other Ambulatory Visit (HOSPITAL_BASED_OUTPATIENT_CLINIC_OR_DEPARTMENT_OTHER): Payer: Self-pay

## 2022-10-27 DIAGNOSIS — Z85820 Personal history of malignant melanoma of skin: Secondary | ICD-10-CM | POA: Diagnosis not present

## 2022-10-27 DIAGNOSIS — L821 Other seborrheic keratosis: Secondary | ICD-10-CM | POA: Diagnosis not present

## 2022-10-27 DIAGNOSIS — Z85828 Personal history of other malignant neoplasm of skin: Secondary | ICD-10-CM | POA: Diagnosis not present

## 2022-10-27 DIAGNOSIS — D2271 Melanocytic nevi of right lower limb, including hip: Secondary | ICD-10-CM | POA: Diagnosis not present

## 2022-10-27 DIAGNOSIS — D1801 Hemangioma of skin and subcutaneous tissue: Secondary | ICD-10-CM | POA: Diagnosis not present

## 2022-10-27 DIAGNOSIS — D225 Melanocytic nevi of trunk: Secondary | ICD-10-CM | POA: Diagnosis not present

## 2022-10-27 DIAGNOSIS — L910 Hypertrophic scar: Secondary | ICD-10-CM | POA: Diagnosis not present

## 2022-10-28 NOTE — Progress Notes (Unsigned)
Cardiology Office Note:    Date:  10/30/2022   ID:  Sreshta A [name], DOB Oct 01, 1951, MRN [id number]  PCP:  [name], MD  Cardiologist:  Sinclair Grooms, MD   Referring MD: [name], MD   Chief Complaint  Patient presents with   Coronary Artery Disease   Hypertension   Hyperlipidemia    History of Present Illness:    Tracey [name] is a 71 y.o. female with a hx of hypertension, hyperlipidemia, and CAD treated with medical therapy.   No complaints.  She understands her disease process.  She has physical activity without symptoms. Denies dyspnea and exertional chest pain.  No medication side effects.   Past Medical History:  Diagnosis Date   CAD (coronary artery disease)    Closed hip fracture ([place]) 03/2016   RT HIP   HSV-1 (herpes simplex virus 1) infection    Hypertension    Osteopenia    Osteoporosis    h/o   Pneumonia    Skin cancer    multiple skin cancers    Past Surgical History:  Procedure Laterality Date   CALDWELL LUC     COLONOSCOPY WITH PROPOFOL N/A 03/22/2014   Procedure: COLONOSCOPY WITH PROPOFOL;  Surgeon: Arta Silence, MD;  Location: [facility];  Service: Endoscopy;  Laterality: N/A;   HIP PINNING,CANNULATED Right 03/21/2016   Procedure: CANNULATED HIP PINNING;  Surgeon: Rod Can, MD;  Location: [facility];  Service: Orthopedics;  Laterality: Right;   HIP SURGERY     LUMBAR LAMINECTOMY/DECOMPRESSION MICRODISCECTOMY Left 02/14/2019   Procedure: Laminectomy and Foraminotomy - L5-S1 - left;  Surgeon: Eustace Moore, MD;  Location: [facility];  Service: Neurosurgery;  Laterality: Left;  Laminectomy and Foraminotomy - L5-S1 - left   MOHS SURGERY     NASAL SEPTUM SURGERY     past hx. deviated septum   TONSILLECTOMY      Current Medications: Current Meds  Medication Sig   acetaminophen (TYLENOL) 500 MG tablet Take 1,000 mg by mouth every 6 (six) hours as needed (for pain.).   amLODipine (NORVASC) 5 MG tablet Take 1 tablet (5 mg total)  by mouth daily.   aspirin EC 81 MG tablet Take 81 mg by mouth every evening.   cholecalciferol (VITAMIN D) 25 MCG (1000 UT) tablet Take 1,000 Units by mouth daily.   DULoxetine (CYMBALTA) 20 MG capsule Take 1 capsule (20 mg total) by mouth daily.   estradiol (ESTRACE) 0.1 MG/GM vaginal cream INSERT 1/2 APPLICATORFUL VAGINALLY 2 (TWO) TIMES A WEEK. SUNDAYS & WEDNESDAYS   fish oil-omega-3 fatty acids 1000 MG capsule Take 1,000 mg by mouth 2 (two) times daily.    folic acid (FOLVITE) 1 MG tablet Take 1 tablet (1 mg total) by mouth daily.   gabapentin (NEURONTIN) 400 MG capsule Take 400 mg by mouth at bedtime.   methocarbamol (ROBAXIN) 500 MG tablet TAKE 1 TABLET (500 MG TOTAL) BY MOUTH EVERY 8 (EIGHT) HOURS AS NEEDED FOR MUSCLE SPASMS.   metroNIDAZOLE (METROGEL) 1 % gel APPLY TOPICALLY TO THE AFFECTED AREA ONCE OR TWICE DAILY AS NEEDED   Multiple Vitamin (MULTIVITAMIN WITH MINERALS) TABS tablet Take 1 tablet by mouth daily.   Polyethyl Glycol-Propyl Glycol (SYSTANE OP) Place 1 drop into both eyes 2 (two) times daily as needed (Dry/Irritated eyes).   psyllium (METAMUCIL) 58.6 % packet Take 1 packet by mouth daily.   rosuvastatin (CRESTOR) 20 MG tablet Take 1 tablet (20 mg total) by mouth daily.   Zoledronic Acid (RECLAST  IV) Inject 1 Dose into the vein once. Once a year     Allergies:   Cefaclor, Abaloparatide, Adhesive [tape], Fosamax [alendronate sodium], and Neomycin-bacitracin zn-polymyx   Social History   Socioeconomic History   Marital status: Married    Spouse name: [name]   Number of children: 1   Years of education: Not on file   Highest education level: Not on file  Occupational History   Occupation: MED TECH    Employer: [employer]  Tobacco Use   Smoking status: Never   Smokeless tobacco: Never  Vaping Use   Vaping Use: Never used  Substance and Sexual Activity   Alcohol use: Not Currently    Alcohol/week: 0.3 standard drinks of alcohol   Drug use: No   Sexual  activity: Not Currently    Partners: Male    Birth control/protection: Post-menopausal    Comment: older than 45, less than 5  Other Topics Concern   Not on file  Social History Narrative   Not on file   Social Determinants of Health   Financial Resource Strain: Not on file  Food Insecurity: Not on file  Transportation Needs: Not on file  Physical Activity: Not on file  Stress: Not on file  Social Connections: Not on file     Family History: The patient's family history includes Breast cancer in her maternal aunt and paternal aunt; Cancer in her maternal grandfather; Diabetes in her brother; Heart disease in her paternal grandfather; Hyperlipidemia in her maternal grandmother; Hypertension in her brother, father, and mother; Leukemia in her father; Stroke in her paternal grandfather.  ROS:   Please see the history of present illness.    No new events or concerns all other systems reviewed and are negative.  EKGs/Labs/Other Studies Reviewed:    The following studies were reviewed today: Coronary CTA 5/21 /2021:   IMPRESSION: 1. Coronary calcium score of 704. This was 95 percentile for age and sex matched control.   2. Normal coronary origin with right dominance.   3. Non flow limiting LAD calcified plaque (0-24%), Moderate calcified plaque in circumflex distribution at bifurcation of OM and AV groove circumflex (25-49%) and scattered calcified lesions in proximal and mid RCA up to 50-74% which may be flow limiting.  4.CT FFR: Distal diagonal FFR 0.79 (mild possible flow limitation) in fairly small caliber vessel. Otherwise, normal FFR in all other coronary territory. Continue with medical management, secondary risk factor modification.  EKG:  EKG normal sinus rhythm with vertical axis.  RSR prime V1.  In comparison to prior testing, no changes noted.  Recent Labs: 06/11/2022: Hemoglobin 13.9; Platelets 201.0  Recent Lipid Panel    Component Value Date/Time   CHOL  169 06/27/2020 1106   TRIG 92 06/27/2020 1106   HDL 94 06/27/2020 1106   CHOLHDL 1.8 06/27/2020 1106   CHOLHDL 2.4 04/20/2020 0650   VLDL 11 04/20/2020 0650   LDLCALC 59 06/27/2020 1106    Physical Exam:    VS:  BP 122/68   Pulse 67   Ht '5\' 5"'$  (1.651 m)   Wt 109 lb 6.4 oz (49.6 kg)   SpO2 100%   BMI 18.21 kg/m     Wt Readings from Last 3 Encounters:  10/30/22 109 lb 6.4 oz (49.6 kg)  10/16/22 109 lb (49.4 kg)  09/02/22 108 lb (49 kg)     GEN: Slender. No acute distress HEENT: Normal NECK: No JVD. LYMPHATICS: No lymphadenopathy CARDIAC: No murmur. RRR no gallop,  or edema. VASCULAR:  Normal Pulses. No bruits. RESPIRATORY:  Clear to auscultation without rales, wheezing or rhonchi  ABDOMEN: Soft, non-tender, non-distended, No pulsatile mass, MUSCULOSKELETAL: No deformity  SKIN: Warm and dry NEUROLOGIC:  Alert and oriented x 3 PSYCHIATRIC:  Normal affect   ASSESSMENT:    1. Coronary artery disease involving native coronary artery of native heart without angina pectoris   2. Essential hypertension   3. Hyperlipidemia LDL goal <70   4. Left ventricular hypertrophy   5. Frequent ventricular premature beats    PLAN:    In order of problems listed above:  Risk reduction as outlined with LDL target less than 70, blood pressure 130/80 mmHg, and aerobic activity as noted above. Excellent blood pressure control on current regimen. Rosuvastatin 20 mg/day. No comment other than continued excellent blood pressure control. Not currently an issue.   Overall education and awareness concerning primary/secondary risk prevention was discussed in detail: LDL less than 70, hemoglobin A1c less than 7, blood pressure target less than 130/80 mmHg, >150 minutes of moderate aerobic activity per week, avoidance of smoking, weight control (via diet and exercise), and continued surveillance/management of/for obstructive sleep apnea.   [place]    Medication  Adjustments/Labs and Tests Ordered: Current medicines are reviewed at length with the patient today.  Concerns regarding medicines are outlined above.  No orders of the defined types were placed in this encounter.  No orders of the defined types were placed in this encounter.   There are no Patient Instructions on file for this visit.   Signed, Sinclair Grooms, MD  10/30/2022 2:02 PM    [facility]

## 2022-10-30 ENCOUNTER — Ambulatory Visit: Payer: PPO | Attending: Interventional Cardiology | Admitting: Interventional Cardiology

## 2022-10-30 ENCOUNTER — Encounter: Payer: Self-pay | Admitting: Interventional Cardiology

## 2022-10-30 VITALS — BP 122/68 | HR 67 | Ht 65.0 in | Wt 109.4 lb

## 2022-10-30 DIAGNOSIS — I517 Cardiomegaly: Secondary | ICD-10-CM

## 2022-10-30 DIAGNOSIS — I10 Essential (primary) hypertension: Secondary | ICD-10-CM | POA: Diagnosis not present

## 2022-10-30 DIAGNOSIS — E785 Hyperlipidemia, unspecified: Secondary | ICD-10-CM | POA: Diagnosis not present

## 2022-10-30 DIAGNOSIS — I2510 Atherosclerotic heart disease of native coronary artery without angina pectoris: Secondary | ICD-10-CM | POA: Diagnosis not present

## 2022-10-30 DIAGNOSIS — I493 Ventricular premature depolarization: Secondary | ICD-10-CM | POA: Diagnosis not present

## 2022-10-30 NOTE — Patient Instructions (Signed)
Medication Instructions:  Your physician recommends that you continue on your current medications as directed. Please refer to the Current Medication list given to you today.  *If you need a refill on your cardiac medications before your next appointment, please call your pharmacy*  Follow-Up: At [name] you and your health needs are our priority.  As part of our continuing mission to provide you with exceptional heart care, we have created designated Provider Care Teams.  These Care Teams include your primary Cardiologist (physician) and Advanced Practice Providers (APPs -  Physician Assistants and Nurse Practitioners) who all work together to provide you with the care you need, when you need it.  Your next appointment:   1 year(s)  The format for your next appointment:   In Person  Provider:   Rudean Haskell, MD or Gwyndolyn Kaufman, MD  Important Information About Sugar

## 2022-11-07 ENCOUNTER — Other Ambulatory Visit (HOSPITAL_BASED_OUTPATIENT_CLINIC_OR_DEPARTMENT_OTHER): Payer: Self-pay

## 2022-11-07 ENCOUNTER — Other Ambulatory Visit: Payer: Self-pay | Admitting: Interventional Cardiology

## 2022-11-07 ENCOUNTER — Other Ambulatory Visit: Payer: Self-pay | Admitting: Family Medicine

## 2022-11-07 MED ORDER — ROSUVASTATIN CALCIUM 20 MG PO TABS
20.0000 mg | ORAL_TABLET | Freq: Every day | ORAL | 3 refills | Status: DC
  Filled 2022-11-07: qty 90, 90d supply, fill #0
  Filled 2023-02-05: qty 90, 90d supply, fill #1

## 2022-11-07 MED ORDER — DULOXETINE HCL 20 MG PO CPEP
20.0000 mg | ORAL_CAPSULE | Freq: Every day | ORAL | 0 refills | Status: DC
  Filled 2022-11-07: qty 90, 90d supply, fill #0

## 2022-11-20 NOTE — Progress Notes (Signed)
[place] Phone: [phone] Subjective:   Tracey [name], am serving as a scribe for Dr. Hulan Saas.  I'm seeing this patient by the request  of:  [name], MD  CC: Back pain follow-up  [id number]  Tracey [name] is a 71 y.o. female coming in with complaint of back and neck pain. OMT 10/16/2022. Patient states that she is doing well. Some days are better than others.  Does seem that the Cymbalta continues to help her with some of the pain and the radicular symptoms.  Medications patient has been prescribed: Cymbalta, Robaxin  Taking: Yes         Reviewed prior external information including notes and imaging from previsou exam, outside providers and external EMR if available.   As well as notes that were available from care everywhere and other healthcare systems.  Past medical history, social, surgical and family history all reviewed in electronic medical record.  [name] pertanent information unless stated regarding to the chief complaint.   Past Medical History:  Diagnosis Date   CAD (coronary artery disease)    Closed hip fracture ([place]) 03/2016   RT HIP   HSV-1 (herpes simplex virus 1) infection    Hypertension    Osteopenia    Osteoporosis    h/o   Pneumonia    Skin cancer    multiple skin cancers    Allergies  Allergen Reactions   Cefaclor Hives   Abaloparatide     Other reaction(s): headaches, lethargy, fatigue   Adhesive [Tape]    Fosamax [Alendronate Sodium] Nausea Only    Gi upset   Neomycin-Bacitracin Zn-Polymyx Rash     Review of Systems:  [name] headache, visual changes, nausea, vomiting, diarrhea, constipation, dizziness, abdominal pain, skin rash, fevers, chills, night sweats, weight loss, swollen lymph nodes, body aches, joint swelling, chest pain, shortness of breath, mood changes. POSITIVE muscle aches  Objective  Blood pressure 118/78, pulse 76, height '5\' 5"'$   (1.651 m), weight 110 lb (49.9 kg), SpO2 97 %.   General: [name] apparent distress alert and oriented x3 mood and affect normal, dressed appropriately.  HEENT: Pupils equal, extraocular movements intact  Respiratory: Patient's speak in full sentences and does not appear short of breath  Cardiovascular: [name] lower extremity edema, non tender, [name] erythema   Back exam does have some loss of lordosis.  Tightness noted of the hamstring on the left side still pending patient also has tightness of the low back pain as well   Osteopathic findings  C2 flexed rotated and side bent right C6 flexed rotated and side bent left T3 extnded rotated and side bent right inhaled rib T9 extended rotated and side bent left L3 flexed rotated and side bent right L5 flexed rotated and side bent left Sacrum right on right       Assessment and Plan:  Lumbar radiculopathy Intermittent doing relatively well with the Cymbalta at the moment.  Discussed that 20 mg 31 does not want to go up on the dose at the moment.  Discussed with patient about icing regimen and home exercises otherwise.  Discussed about the hip abductors as well.  Follow-up again in 6 to 8 weeks.    Nonallopathic problems  Decision today to treat with OMT was based on Physical Exam  After verbal consent patient was treated with , ME, FPR techniques in cervical, rib, thoracic, lumbar, and sacral  areas avoided HVLA with patient's history  of early osteoporosis.  Patient tolerated the procedure well with improvement in symptoms  Patient given exercises, stretches and lifestyle modifications  See medications in patient instructions if given  Patient will follow up in 4-8 weeks    The above documentation has been reviewed and is accurate and complete [name], DO          Note: This dictation was prepared with Dragon dictation along with smaller phrase technology. Any transcriptional errors that result from this process are  unintentional.

## 2022-11-27 ENCOUNTER — Encounter: Payer: Self-pay | Admitting: Family Medicine

## 2022-11-27 ENCOUNTER — Ambulatory Visit: Payer: PPO | Admitting: Family Medicine

## 2022-11-27 VITALS — BP 118/78 | HR 76 | Ht 65.0 in | Wt 110.0 lb

## 2022-11-27 DIAGNOSIS — M9904 Segmental and somatic dysfunction of sacral region: Secondary | ICD-10-CM | POA: Diagnosis not present

## 2022-11-27 DIAGNOSIS — M9902 Segmental and somatic dysfunction of thoracic region: Secondary | ICD-10-CM

## 2022-11-27 DIAGNOSIS — M9903 Segmental and somatic dysfunction of lumbar region: Secondary | ICD-10-CM

## 2022-11-27 DIAGNOSIS — M9908 Segmental and somatic dysfunction of rib cage: Secondary | ICD-10-CM

## 2022-11-27 DIAGNOSIS — M9901 Segmental and somatic dysfunction of cervical region: Secondary | ICD-10-CM

## 2022-11-27 DIAGNOSIS — M5416 Radiculopathy, lumbar region: Secondary | ICD-10-CM

## 2022-11-27 NOTE — Patient Instructions (Addendum)
Good to see you Happy New Year See me in 6-8 weeks

## 2022-11-27 NOTE — Assessment & Plan Note (Signed)
Intermittent doing relatively well with the Cymbalta at the moment.  Discussed that 20 mg 31 does not want to go up on the dose at the moment.  Discussed with patient about icing regimen and home exercises otherwise.  Discussed about the hip abductors as well.  Follow-up again in 6 to 8 weeks.

## 2022-12-17 ENCOUNTER — Ambulatory Visit
Admission: RE | Admit: 2022-12-17 | Discharge: 2022-12-17 | Disposition: A | Discharge status: Home / Self Care | Payer: PPO | Source: Ambulatory Visit

## 2022-12-17 DIAGNOSIS — Z1231 Encounter for screening mammogram for malignant neoplasm of breast: Secondary | ICD-10-CM

## 2023-01-15 ENCOUNTER — Other Ambulatory Visit: Payer: Self-pay | Admitting: Obstetrics & Gynecology

## 2023-01-15 ENCOUNTER — Other Ambulatory Visit: Payer: Self-pay

## 2023-01-15 ENCOUNTER — Other Ambulatory Visit (HOSPITAL_BASED_OUTPATIENT_CLINIC_OR_DEPARTMENT_OTHER): Payer: Self-pay

## 2023-01-15 ENCOUNTER — Encounter: Payer: Self-pay | Admitting: Family Medicine

## 2023-01-15 ENCOUNTER — Ambulatory Visit: Payer: PPO | Admitting: Family Medicine

## 2023-01-15 VITALS — BP 108/70 | HR 79 | Ht 65.0 in | Wt 109.0 lb

## 2023-01-15 DIAGNOSIS — M9904 Segmental and somatic dysfunction of sacral region: Secondary | ICD-10-CM

## 2023-01-15 DIAGNOSIS — M9908 Segmental and somatic dysfunction of rib cage: Secondary | ICD-10-CM | POA: Diagnosis not present

## 2023-01-15 DIAGNOSIS — M9903 Segmental and somatic dysfunction of lumbar region: Secondary | ICD-10-CM | POA: Diagnosis not present

## 2023-01-15 DIAGNOSIS — M9902 Segmental and somatic dysfunction of thoracic region: Secondary | ICD-10-CM | POA: Diagnosis not present

## 2023-01-15 DIAGNOSIS — M533 Sacrococcygeal disorders, not elsewhere classified: Secondary | ICD-10-CM

## 2023-01-15 DIAGNOSIS — M9901 Segmental and somatic dysfunction of cervical region: Secondary | ICD-10-CM

## 2023-01-15 MED ORDER — ESTRADIOL 0.1 MG/GM VA CREA
TOPICAL_CREAM | VAGINAL | 1 refills | Status: DC
  Filled 2023-01-15: qty 42.5, 90d supply, fill #0

## 2023-01-15 NOTE — Patient Instructions (Signed)
Hope you get your bike back soon [place] See me in 6 weeks

## 2023-01-15 NOTE — Assessment & Plan Note (Signed)
Sacroiliac dysfunction still noted.  Discussed icing regimen and home exercises.  Responding better to osteopathic manipulation and muscle energy techniques.  Patient is to work on core strengthening.

## 2023-01-15 NOTE — Progress Notes (Signed)
[place] Phone: [phone] Subjective:   [name], am serving as a scribe for Dr. Hulan Saas.  I'm seeing this patient by the request  of:  [name], MD  CC: back and neck pain   [id number]  Tracey [name] is a 72 y.o. female coming in with complaint of back and neck pain. OMT on 11/27/2022. Patient states that she is tight today.          Reviewed prior external information including notes and imaging from previsou exam, outside providers and external EMR if available.   As well as notes that were available from care everywhere and other healthcare systems.  Past medical history, social, surgical and family history all reviewed in electronic medical record.  No pertanent information unless stated regarding to the chief complaint.   Past Medical History:  Diagnosis Date   CAD (coronary artery disease)    Closed hip fracture ([place]) 03/2016   RT HIP   HSV-1 (herpes simplex virus 1) infection    Hypertension    Osteopenia    Osteoporosis    h/o   Pneumonia    Skin cancer    multiple skin cancers   SOB (shortness of breath) 06/14/2013    Allergies  Allergen Reactions   Cefaclor Hives   Abaloparatide     Other reaction(s): headaches, lethargy, fatigue   Adhesive [Tape]    Fosamax [Alendronate Sodium] Nausea Only    Gi upset   Neomycin-Bacitracin Zn-Polymyx Rash     Review of Systems:  No headache, visual changes, nausea, vomiting, diarrhea, constipation, dizziness, abdominal pain, skin rash, fevers, chills, night sweats, weight loss, swollen lymph nodes, body aches, joint swelling, chest pain, shortness of breath, mood changes. POSITIVE muscle aches  Objective  Blood pressure 108/70, pulse 79, height 5' 5"$  (1.651 m), weight 109 lb (49.4 kg), SpO2 98 %.   General: No apparent distress alert and oriented x3 mood and affect normal, dressed appropriately.  HEENT: Pupils  equal, extraocular movements intact  Respiratory: Patient's speak in full sentences and does not appear short of breath  Cardiovascular: No lower extremity edema, non tender, no erythema  Low back does have loss of lordosis  Positive FABER Tightness of hamstring noted   Osteopathic findings  C2 flexed rotated and side bent right C7 flexed rotated and side bent left T3 extended rotated and side bent right inhaled rib T7 extended rotated and side bent left L1 flexed rotated and side bent right Sacrum right on right    Assessment and Plan:  Sacroiliac dysfunction Sacroiliac dysfunction still noted.  Discussed icing regimen and home exercises.  Responding better to osteopathic manipulation and muscle energy techniques.  Patient is to work on core strengthening.    Nonallopathic problems  Decision today to treat with OMT was based on Physical Exam  After verbal consent patient was treated with ME, FPR techniques in cervical, rib, thoracic, lumbar, and sacral  areas  Patient tolerated the procedure well with improvement in symptoms  Patient given exercises, stretches and lifestyle modifications  See medications in patient instructions if given  Patient will follow up in 4-8 weeks    The above documentation has been reviewed and is accurate and complete [name], DO          Note: This dictation was prepared with Dragon dictation along with smaller phrase technology. Any transcriptional errors that result from this process are unintentional.

## 2023-01-15 NOTE — Telephone Encounter (Addendum)
AEX 01/14/2022. AEX scheduled 03/30/23.  MMG 12/18/2022  neg

## 2023-02-02 ENCOUNTER — Other Ambulatory Visit (HOSPITAL_BASED_OUTPATIENT_CLINIC_OR_DEPARTMENT_OTHER): Payer: Self-pay

## 2023-02-02 ENCOUNTER — Encounter: Payer: Self-pay | Admitting: Family Medicine

## 2023-02-02 DIAGNOSIS — C44719 Basal cell carcinoma of skin of left lower limb, including hip: Secondary | ICD-10-CM | POA: Diagnosis not present

## 2023-02-02 DIAGNOSIS — Z85828 Personal history of other malignant neoplasm of skin: Secondary | ICD-10-CM | POA: Diagnosis not present

## 2023-02-02 DIAGNOSIS — L910 Hypertrophic scar: Secondary | ICD-10-CM | POA: Diagnosis not present

## 2023-02-02 DIAGNOSIS — Z85820 Personal history of malignant melanoma of skin: Secondary | ICD-10-CM | POA: Diagnosis not present

## 2023-02-02 DIAGNOSIS — L905 Scar conditions and fibrosis of skin: Secondary | ICD-10-CM | POA: Diagnosis not present

## 2023-02-02 DIAGNOSIS — D225 Melanocytic nevi of trunk: Secondary | ICD-10-CM | POA: Diagnosis not present

## 2023-02-02 DIAGNOSIS — D2262 Melanocytic nevi of left upper limb, including shoulder: Secondary | ICD-10-CM | POA: Diagnosis not present

## 2023-02-02 DIAGNOSIS — L570 Actinic keratosis: Secondary | ICD-10-CM | POA: Diagnosis not present

## 2023-02-02 DIAGNOSIS — D2272 Melanocytic nevi of left lower limb, including hip: Secondary | ICD-10-CM | POA: Diagnosis not present

## 2023-02-02 DIAGNOSIS — D2271 Melanocytic nevi of right lower limb, including hip: Secondary | ICD-10-CM | POA: Diagnosis not present

## 2023-02-02 DIAGNOSIS — L821 Other seborrheic keratosis: Secondary | ICD-10-CM | POA: Diagnosis not present

## 2023-02-02 DIAGNOSIS — D485 Neoplasm of uncertain behavior of skin: Secondary | ICD-10-CM | POA: Diagnosis not present

## 2023-02-03 ENCOUNTER — Other Ambulatory Visit (HOSPITAL_BASED_OUTPATIENT_CLINIC_OR_DEPARTMENT_OTHER): Payer: Self-pay

## 2023-02-03 ENCOUNTER — Other Ambulatory Visit: Payer: Self-pay

## 2023-02-03 DIAGNOSIS — H5213 Myopia, bilateral: Secondary | ICD-10-CM | POA: Diagnosis not present

## 2023-02-03 DIAGNOSIS — Z961 Presence of intraocular lens: Secondary | ICD-10-CM | POA: Diagnosis not present

## 2023-02-03 MED ORDER — DULOXETINE HCL 20 MG PO CPEP
20.0000 mg | ORAL_CAPSULE | Freq: Every day | ORAL | 0 refills | Status: DC
  Filled 2023-02-03: qty 100, 100d supply, fill #0

## 2023-02-16 DIAGNOSIS — M810 Age-related osteoporosis without current pathological fracture: Secondary | ICD-10-CM | POA: Diagnosis not present

## 2023-02-16 DIAGNOSIS — Z8781 Personal history of (healed) traumatic fracture: Secondary | ICD-10-CM | POA: Diagnosis not present

## 2023-02-26 NOTE — Progress Notes (Signed)
Tracey [name] [address] [place] Phone: [phone] Subjective:   Tracey [name], am serving as a scribe for Dr. Hulan Saas.  I'm seeing this patient by the request  of:  Tracey Carol, MD  CC: back and neck pain   [id number]  Tracey [name] is a 72 y.o. female coming in with complaint of back and neck pain. OMT on 01/15/2023. Patient states doing well.  Has been active overall but continues to have some discomfort in the paraspinal musculature.   Medications patient has been prescribed: Cymbalta  Taking:         Reviewed prior external information including notes and imaging from previsou exam, outside providers and external EMR if available.   As well as notes that were available from care everywhere and other healthcare systems.  Past medical history, social, surgical and family history all reviewed in electronic medical record.  No pertanent information unless stated regarding to the chief complaint.   Past Medical History:  Diagnosis Date   CAD (coronary artery disease)    Closed hip fracture ([place]) 03/2016   RT HIP   HSV-1 (herpes simplex virus 1) infection    Hypertension    Osteopenia    Osteoporosis    h/o   Pneumonia    Skin cancer    multiple skin cancers   SOB (shortness of breath) 06/14/2013    Allergies  Allergen Reactions   Cefaclor Hives   Abaloparatide     Other reaction(s): headaches, lethargy, fatigue   Adhesive [Tape]    Fosamax [Alendronate Sodium] Nausea Only    Gi upset   Neomycin-Bacitracin Zn-Polymyx Rash     Review of Systems:  No headache, visual changes, nausea, vomiting, diarrhea, constipation, dizziness, abdominal pain, skin rash, fevers, chills, night sweats, weight loss, swollen lymph nodes, body aches, joint swelling, chest pain, shortness of breath, mood changes. POSITIVE muscle aches  Objective  Blood pressure 124/82, pulse 72, height 5\' 5"  (1.651 m), SpO2 98 %.    General: No apparent distress alert and oriented x3 mood and affect normal, dressed appropriately.  HEENT: Pupils equal, extraocular movements intact  Respiratory: Patient's speak in full sentences and does not appear short of breath  Cardiovascular: No lower extremity edema, non tender, no erythema  MSK:  Back does have some mild loss of lordosis.  Some tenderness to palpation noted.  Tightness with Faber pain in the thoracolumbar juncture.  Osteopathic findings  C2 flexed rotated and side bent right C7 flexed rotated and side bent left T3 extended rotated and side bent right inhaled rib T8 extended rotated and side bent left L2 flexed rotated and side bent right Sacrum right on right    Assessment and Plan:  Lumbar radiculopathy Continues to have some tightness.  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  Discussed with patient about icing regimen and home exercises.  Increase activity slowly otherwise.  Follow-up again in 6 to 8 weeks    Nonallopathic problems  Decision today to treat with OMT was based on Physical Exam  After verbal consent patient was treated with  ME, FPR techniques in cervical, rib, thoracic, lumbar, and sacral  areas  Patient tolerated the procedure well with improvement in symptoms  Patient given exercises, stretches and lifestyle modifications  See medications in patient instructions if given  Patient will follow up in 4-8 weeks  The above documentation has been reviewed and is accurate and complete Tracey Pulley, DO         Note: This dictation was prepared with Dragon dictation along with smaller phrase technology. Any transcriptional errors that result from this process are unintentional.

## 2023-03-05 ENCOUNTER — Encounter: Payer: Self-pay | Admitting: Family Medicine

## 2023-03-05 ENCOUNTER — Ambulatory Visit: Payer: PPO | Admitting: Family Medicine

## 2023-03-05 VITALS — BP 124/82 | HR 72 | Ht 65.0 in

## 2023-03-05 DIAGNOSIS — M9902 Segmental and somatic dysfunction of thoracic region: Secondary | ICD-10-CM | POA: Diagnosis not present

## 2023-03-05 DIAGNOSIS — M9908 Segmental and somatic dysfunction of rib cage: Secondary | ICD-10-CM

## 2023-03-05 DIAGNOSIS — M5416 Radiculopathy, lumbar region: Secondary | ICD-10-CM

## 2023-03-05 DIAGNOSIS — M9904 Segmental and somatic dysfunction of sacral region: Secondary | ICD-10-CM | POA: Diagnosis not present

## 2023-03-05 DIAGNOSIS — M9901 Segmental and somatic dysfunction of cervical region: Secondary | ICD-10-CM

## 2023-03-05 DIAGNOSIS — M9903 Segmental and somatic dysfunction of lumbar region: Secondary | ICD-10-CM

## 2023-03-05 NOTE — Assessment & Plan Note (Signed)
Continues to have some tightness.  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  Discussed with patient about icing regimen and home exercises.  Increase activity slowly otherwise.  Follow-up again in 6 to 8 weeks

## 2023-03-05 NOTE — Patient Instructions (Signed)
Good to see you! Glad everyone's alive See you again in 6-8 weeks

## 2023-03-30 ENCOUNTER — Encounter: Payer: Self-pay | Admitting: Obstetrics & Gynecology

## 2023-03-30 ENCOUNTER — Other Ambulatory Visit (HOSPITAL_BASED_OUTPATIENT_CLINIC_OR_DEPARTMENT_OTHER): Payer: Self-pay

## 2023-03-30 ENCOUNTER — Ambulatory Visit (INDEPENDENT_AMBULATORY_CARE_PROVIDER_SITE_OTHER): Payer: PPO | Admitting: Obstetrics & Gynecology

## 2023-03-30 VITALS — BP 128/82 | HR 71 | Ht 65.75 in | Wt 108.0 lb

## 2023-03-30 DIAGNOSIS — Z9189 Other specified personal risk factors, not elsewhere classified: Secondary | ICD-10-CM | POA: Diagnosis not present

## 2023-03-30 DIAGNOSIS — R3915 Urgency of urination: Secondary | ICD-10-CM

## 2023-03-30 DIAGNOSIS — Z01419 Encounter for gynecological examination (general) (routine) without abnormal findings: Secondary | ICD-10-CM

## 2023-03-30 DIAGNOSIS — N952 Postmenopausal atrophic vaginitis: Secondary | ICD-10-CM

## 2023-03-30 DIAGNOSIS — B009 Herpesviral infection, unspecified: Secondary | ICD-10-CM | POA: Diagnosis not present

## 2023-03-30 DIAGNOSIS — Z780 Asymptomatic menopausal state: Secondary | ICD-10-CM

## 2023-03-30 MED ORDER — ESTRADIOL 0.1 MG/GM VA CREA
TOPICAL_CREAM | VAGINAL | 4 refills | Status: AC
  Filled 2023-03-30: qty 42.5, 90d supply, fill #0
  Filled 2023-06-17: qty 42.5, 90d supply, fill #1
  Filled 2023-09-16: qty 42.5, 90d supply, fill #2
  Filled 2023-11-02 – 2023-12-07 (×2): qty 42.5, 90d supply, fill #3
  Filled 2024-03-09: qty 42.5, 90d supply, fill #4

## 2023-03-30 NOTE — Progress Notes (Signed)
Tracey [name] 04-16-1951 [id number]   History:    72 y.o. G1P1L1 Married.  Tracey [name] is 72 yo, has 2 boys. Lives at [facility] :)   RP:  Established patient presenting for annual gyn exam    HPI:  Postmenopause, well on no HRT.  No PMB. No pelvic pain. No pain with IC on Estrace cream. Pap Neg in 08/2019.  Urge urinary incontinence.  BMs normal.  Breasts normal. Mammo 12/2022 Neg. BMI 17.56. Walking.  BD Osteoporosis Forearm -3.7 in 07/2020 on Reclast holliday  with Endocrino.  Health labs with Fam MD.  Tracey [name] 2015.    Past medical history,surgical history, family history and social history were all reviewed and documented in the EPIC chart.  Gynecologic History No LMP recorded. Patient is postmenopausal.  Obstetric History OB History  Gravida Para Term Preterm AB Living  1 1       1   SAB IAB Ectopic Multiple Live Births               # Outcome Date GA Lbr Len/2nd Weight Sex Delivery Anes PTL Lv  1 Para 1978    F Vag-Spont        ROS: A ROS was performed and pertinent positives and negatives are included in the history. GENERAL: No fevers or chills. HEENT: No change in vision, no earache, sore throat or sinus congestion. NECK: No pain or stiffness. CARDIOVASCULAR: No chest pain or pressure. No palpitations. PULMONARY: No shortness of breath, cough or wheeze. GASTROINTESTINAL: No abdominal pain, nausea, vomiting or diarrhea, melena or bright red blood per rectum. GENITOURINARY: No urinary frequency, urgency, hesitancy or dysuria. MUSCULOSKELETAL: No joint or muscle pain, no back pain, no recent trauma. DERMATOLOGIC: No rash, no itching, no lesions. ENDOCRINE: No polyuria, polydipsia, no heat or cold intolerance. No recent change in weight. HEMATOLOGICAL: No anemia or easy bruising or bleeding. NEUROLOGIC: No headache, seizures, numbness, tingling or weakness. PSYCHIATRIC: No depression, no loss of interest in normal activity or change in sleep pattern.     Exam:   BP 128/82   Pulse 71    Ht 5' 5.75" (1.67 m)   Wt 108 lb (49 kg)   SpO2 99%   BMI 17.56 kg/m   Body mass index is 17.56 kg/m.  General appearance : Well developed well nourished female. No acute distress HEENT: Eyes: no retinal hemorrhage or exudates,  Neck supple, trachea midline, no carotid bruits, no thyroidmegaly Lungs: Clear to auscultation, no rhonchi or wheezes, or rib retractions  Heart: Regular rate and rhythm, no murmurs or gallops Breast:Examined in sitting and supine position were symmetrical in appearance, no palpable masses or tenderness,  no skin retraction, no nipple inversion, no nipple discharge, no skin discoloration, no axillary or supraclavicular lymphadenopathy Abdomen: no palpable masses or tenderness, no rebound or guarding Extremities: no edema or skin discoloration or tenderness  Pelvic: Vulva: Normal             Vagina: No gross lesions or discharge  Cervix: No gross lesions or discharge  Uterus  AV, normal size, shape and consistency, non-tender and mobile  Adnexa  Without masses or tenderness  Anus: Normal   Assessment/Plan:  72 y.o. female for annual exam   1. Well female exam with routine gynecological exam Postmenopause, well on no HRT.  No PMB. No pelvic pain. No pain with IC on Estrace cream. Pap Neg in 08/2019.  Urge urinary incontinence.  BMs normal.  Breasts normal. Mammo 12/2022 Neg. BMI 17.56. Walking.  BD Osteoporosis Forearm -3.7 in 07/2020 on Reclast holliday  with Endocrino.  Health labs with Fam MD.  Tracey [name] 2015.   2. Postmenopause Postmenopause, well on no HRT.  No PMB. No pelvic pain.   3. Postmenopausal atrophic vaginitis No pain with IC on Estrace cream.  No CI to continue with Estrace cream vaginally 1/2 an applicator twice a week.  Prescription sent to pharmacy.  4. Urinary urgency Counseling done on urinary urgency with mild incontinence.  Will stop caffeine products to see if symptoms improve.  Kegel exercises instructed.  Offered a referral to Urology,  will call back as needed.  Other orders - CALCIUM MAGNESIUM ZINC PO; Take by mouth. - estradiol (ESTRACE) 0.1 MG/GM vaginal cream; INSERT 1/2 APPLICATORFUL VAGINALLY 2 (TWO) TIMES A WEEK. ON SUNDAYS & WEDNESDAYS   Genia Del MD, 3:20 PM

## 2023-04-06 ENCOUNTER — Other Ambulatory Visit (HOSPITAL_BASED_OUTPATIENT_CLINIC_OR_DEPARTMENT_OTHER): Payer: Self-pay

## 2023-04-06 ENCOUNTER — Telehealth: Payer: Self-pay | Admitting: Cardiology

## 2023-04-06 ENCOUNTER — Encounter: Payer: Self-pay | Admitting: Family Medicine

## 2023-04-06 ENCOUNTER — Other Ambulatory Visit: Payer: Self-pay

## 2023-04-06 MED ORDER — ROSUVASTATIN CALCIUM 20 MG PO TABS
20.0000 mg | ORAL_TABLET | Freq: Every day | ORAL | 1 refills | Status: DC
  Filled 2023-04-06: qty 100, 100d supply, fill #0
  Filled 2023-04-08: qty 30, 30d supply, fill #0
  Filled 2023-05-04: qty 100, 100d supply, fill #1
  Filled 2023-08-05: qty 70, 70d supply, fill #2

## 2023-04-06 MED ORDER — METHOCARBAMOL 500 MG PO TABS
500.0000 mg | ORAL_TABLET | Freq: Three times a day (TID) | ORAL | 0 refills | Status: DC | PRN
  Filled 2023-04-06: qty 90, 30d supply, fill #0

## 2023-04-06 NOTE — Telephone Encounter (Signed)
*  STAT* If patient is at the pharmacy, call can be transferred to refill team.   1. Which medications need to be refilled? (please list name of each medication and dose if known)  rosuvastatin (CRESTOR) 20 MG tablet  2. Which pharmacy/location (including street and city if local pharmacy) is medication to be sent to? [facility]  3. Do they need a 30 day or 90 day supply?   Patient states [employer] will only cover 100 tablets.

## 2023-04-06 NOTE — Telephone Encounter (Signed)
Pt's medication was sent to pt's pharmacy as requested. Confirmation received.  °

## 2023-04-07 ENCOUNTER — Other Ambulatory Visit (HOSPITAL_BASED_OUTPATIENT_CLINIC_OR_DEPARTMENT_OTHER): Payer: Self-pay

## 2023-04-07 MED ORDER — FOLIC ACID 1 MG PO TABS
1.0000 mg | ORAL_TABLET | Freq: Every day | ORAL | 3 refills | Status: DC
  Filled 2023-04-07: qty 100, 100d supply, fill #0
  Filled 2023-07-13: qty 100, 100d supply, fill #1
  Filled 2023-11-02: qty 100, 100d supply, fill #2
  Filled 2024-02-16: qty 100, 100d supply, fill #3

## 2023-04-07 MED ORDER — AMLODIPINE BESYLATE 5 MG PO TABS
5.0000 mg | ORAL_TABLET | Freq: Every day | ORAL | 3 refills | Status: DC
  Filled 2023-04-07: qty 100, 100d supply, fill #0
  Filled 2023-07-13: qty 100, 100d supply, fill #1
  Filled 2023-11-02: qty 100, 100d supply, fill #2
  Filled 2024-02-10: qty 100, 100d supply, fill #3

## 2023-04-08 ENCOUNTER — Other Ambulatory Visit (HOSPITAL_BASED_OUTPATIENT_CLINIC_OR_DEPARTMENT_OTHER): Payer: Self-pay

## 2023-05-01 NOTE — Progress Notes (Unsigned)
[name] [address] [place] [address] Phone: [phone] Subjective:   [name], am serving as a scribe for Dr. Antoine Primas.  I'm seeing this patient by the request  of:  Renford Dills, MD  CC: back and neck pain follow up   [id number]  Tracey [name] is a 72 y.o. female coming in with complaint of back and neck pain. OMT 03/05/2023. Patient states same per usual. No new concerns.  Medications patient has been prescribed: Cymbalta, Robaxin          Reviewed prior external information including notes and imaging from previsou exam, outside providers and external EMR if available.   As well as notes that were available from care everywhere and other healthcare systems.  Past medical history, social, surgical and family history all reviewed in electronic medical record.  No pertanent information unless stated regarding to the chief complaint.   Past Medical History:  Diagnosis Date   CAD (coronary artery disease)    Closed hip fracture ([facility]) 03/2016   RT HIP   HSV-1 (herpes simplex virus 1) infection    Hypertension    Osteopenia    Osteoporosis    h/o   Pneumonia    Skin cancer    multiple skin cancers   SOB (shortness of breath) 06/14/2013    Allergies  Allergen Reactions   Cefaclor Hives   Abaloparatide     Other reaction(s): headaches, lethargy, fatigue   Adhesive [Tape]    Bacitracin-Polymyxin B Rash   Fosamax [Alendronate Sodium] Nausea Only    Gi upset   Neomycin-Bacitracin Zn-Polymyx Rash     Review of Systems:  No headache, visual changes, nausea, vomiting, diarrhea, constipation, dizziness, abdominal pain, skin rash, fevers, chills, night sweats, weight loss, swollen lymph nodes, body aches, joint swelling, chest pain, shortness of breath, mood changes. POSITIVE muscle aches  Objective  Blood pressure 116/74, pulse 81, height 5\' 5"  (1.651 m), weight 108 lb (49 kg), SpO2 98 %.   General: No  apparent distress alert and oriented x3 mood and affect normal, dressed appropriately.  HEENT: Pupils equal, extraocular movements intact does have some fullness noted of the maxillary area that seems to be more secondary to a sinusitis Respiratory: Patient's speak in full sentences and does not appear short of breath  Cardiovascular: No lower extremity edema, non tender, no erythema  MSK:  Back does have some mild loss of lordosis.  Tightness noted in the paraspinal musculature.  Patient does not have any significant worsening discomfort noted. Patient is still underweight   Osteopathic findings  C3 flexed rotated and side bent right C6 flexed rotated and side bent left T3 extended rotated and side bent right inhaled rib T8 extended rotated and side bent left L3 flexed rotated and side bent right L4 rotated and side bent Sacrum right on right     Assessment and Plan:  Lumbar radiculopathy Likely no more significant radicular symptoms at this time.  Discussed icing regimen and home exercises, discussed which activities to do and which ones to avoid.  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  Follow-up with me again in 6 to 8 weeks otherwise.    Nonallopathic problems  Decision today to treat with OMT was based on Physical Exam  After verbal consent patient was treated with HVLA, ME, FPR techniques in cervical, rib, thoracic, lumbar, and sacral  areas  Patient tolerated the procedure well with improvement in symptoms  Patient given exercises, stretches and lifestyle modifications  See medications in patient instructions if given  Patient will follow up in 4-8 weeks    The above documentation has been reviewed and is accurate and complete Judi Saa, DO          Note: This dictation was prepared with Dragon dictation along with smaller phrase technology. Any transcriptional errors that result  from this process are unintentional.

## 2023-05-04 ENCOUNTER — Encounter: Payer: Self-pay | Admitting: Family Medicine

## 2023-05-04 ENCOUNTER — Other Ambulatory Visit (HOSPITAL_BASED_OUTPATIENT_CLINIC_OR_DEPARTMENT_OTHER): Payer: Self-pay

## 2023-05-04 ENCOUNTER — Other Ambulatory Visit: Payer: Self-pay | Admitting: Family Medicine

## 2023-05-04 ENCOUNTER — Ambulatory Visit: Payer: PPO | Admitting: Family Medicine

## 2023-05-04 VITALS — BP 116/74 | HR 81 | Ht 65.0 in | Wt 108.0 lb

## 2023-05-04 DIAGNOSIS — M9903 Segmental and somatic dysfunction of lumbar region: Secondary | ICD-10-CM | POA: Diagnosis not present

## 2023-05-04 DIAGNOSIS — M9908 Segmental and somatic dysfunction of rib cage: Secondary | ICD-10-CM

## 2023-05-04 DIAGNOSIS — M5416 Radiculopathy, lumbar region: Secondary | ICD-10-CM | POA: Diagnosis not present

## 2023-05-04 DIAGNOSIS — Z85828 Personal history of other malignant neoplasm of skin: Secondary | ICD-10-CM | POA: Diagnosis not present

## 2023-05-04 DIAGNOSIS — C44719 Basal cell carcinoma of skin of left lower limb, including hip: Secondary | ICD-10-CM | POA: Diagnosis not present

## 2023-05-04 DIAGNOSIS — D2262 Melanocytic nevi of left upper limb, including shoulder: Secondary | ICD-10-CM | POA: Diagnosis not present

## 2023-05-04 DIAGNOSIS — M9901 Segmental and somatic dysfunction of cervical region: Secondary | ICD-10-CM | POA: Diagnosis not present

## 2023-05-04 DIAGNOSIS — M9904 Segmental and somatic dysfunction of sacral region: Secondary | ICD-10-CM | POA: Diagnosis not present

## 2023-05-04 DIAGNOSIS — D225 Melanocytic nevi of trunk: Secondary | ICD-10-CM | POA: Diagnosis not present

## 2023-05-04 DIAGNOSIS — Z85820 Personal history of malignant melanoma of skin: Secondary | ICD-10-CM | POA: Diagnosis not present

## 2023-05-04 DIAGNOSIS — L570 Actinic keratosis: Secondary | ICD-10-CM | POA: Diagnosis not present

## 2023-05-04 DIAGNOSIS — L821 Other seborrheic keratosis: Secondary | ICD-10-CM | POA: Diagnosis not present

## 2023-05-04 DIAGNOSIS — D2261 Melanocytic nevi of right upper limb, including shoulder: Secondary | ICD-10-CM | POA: Diagnosis not present

## 2023-05-04 DIAGNOSIS — M9902 Segmental and somatic dysfunction of thoracic region: Secondary | ICD-10-CM | POA: Diagnosis not present

## 2023-05-04 DIAGNOSIS — L814 Other melanin hyperpigmentation: Secondary | ICD-10-CM | POA: Diagnosis not present

## 2023-05-04 DIAGNOSIS — D485 Neoplasm of uncertain behavior of skin: Secondary | ICD-10-CM | POA: Diagnosis not present

## 2023-05-04 DIAGNOSIS — D2271 Melanocytic nevi of right lower limb, including hip: Secondary | ICD-10-CM | POA: Diagnosis not present

## 2023-05-04 MED ORDER — DULOXETINE HCL 20 MG PO CPEP
20.0000 mg | ORAL_CAPSULE | Freq: Every day | ORAL | 0 refills | Status: DC
  Filled 2023-05-04: qty 100, 100d supply, fill #0

## 2023-05-04 NOTE — Patient Instructions (Signed)
Good to see you! See you again in 7-8 weeks  

## 2023-05-04 NOTE — Assessment & Plan Note (Signed)
Likely no more significant radicular symptoms at this time.  Discussed icing regimen and home exercises, discussed which activities to do and which ones to avoid.  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  Follow-up with me again in 6 to 8 weeks otherwise.

## 2023-05-05 ENCOUNTER — Other Ambulatory Visit (HOSPITAL_BASED_OUTPATIENT_CLINIC_OR_DEPARTMENT_OTHER): Payer: Self-pay

## 2023-05-28 DIAGNOSIS — C44311 Basal cell carcinoma of skin of nose: Secondary | ICD-10-CM | POA: Diagnosis not present

## 2023-05-28 DIAGNOSIS — D485 Neoplasm of uncertain behavior of skin: Secondary | ICD-10-CM | POA: Diagnosis not present

## 2023-05-28 DIAGNOSIS — L988 Other specified disorders of the skin and subcutaneous tissue: Secondary | ICD-10-CM | POA: Diagnosis not present

## 2023-06-12 NOTE — Progress Notes (Signed)
[name] [address] [place] [address] Phone: [phone] Subjective:   [name], am serving as a scribe for Dr. Antoine Primas.  I'm seeing this patient by the request  of:  Renford Dills, MD  CC: Back pain follow-up  [id number]  Tracey [name] is a 72 y.o. female coming in with complaint of back and neck pain. OMT on 05/04/2023. Patient states same per usual. No new concerns. Overall has some very mild stiffness but nothing that is stopping her from activity at this time.  Medications patient has been prescribed: Cymbalta  Taking:yes          Reviewed prior external information including notes and imaging from previsou exam, outside providers and external EMR if available.   As well as notes that were available from care everywhere and other healthcare systems.  Past medical history, social, surgical and family history all reviewed in electronic medical record.  No pertanent information unless stated regarding to the chief complaint.   Past Medical History:  Diagnosis Date   CAD (coronary artery disease)    Closed hip fracture ([facility]) 03/2016   RT HIP   HSV-1 (herpes simplex virus 1) infection    Hypertension    Osteopenia    Osteoporosis    h/o   Pneumonia    Skin cancer    multiple skin cancers   SOB (shortness of breath) 06/14/2013    Allergies  Allergen Reactions   Cefaclor Hives   Abaloparatide     Other reaction(s): headaches, lethargy, fatigue   Adhesive [Tape]    Bacitracin-Polymyxin B Rash   Fosamax [Alendronate Sodium] Nausea Only    Gi upset   Neomycin-Bacitracin Zn-Polymyx Rash     Review of Systems:  No headache, visual changes, nausea, vomiting, diarrhea, constipation, dizziness, abdominal pain, skin rash, fevers, chills, night sweats, weight loss, swollen lymph nodes, body aches, joint swelling, chest pain, shortness of breath, mood changes. POSITIVE muscle aches  Objective  Blood  pressure 110/68, pulse 82, height 5\' 5"  (1.651 m), weight 108 lb (49 kg), SpO2 98%.   General: No apparent distress alert and oriented x3 mood and affect normal, dressed appropriately.  HEENT: Pupils equal, extraocular movements intact  Respiratory: Patient's speak in full sentences and does not appear short of breath  Cardiovascular: No lower extremity edema, non tender, no erythema  Low back exam does have some loss lordosis noted.  Osteopathic findings  C7 flexed rotated and side bent left T3 extended rotated and side bent right inhaled rib T7 extended rotated and side bent left L1 flexed rotated and side bent right Sacrum right on right       Assessment and Plan:  No problem-specific Assessment & Plan notes found for this encounter.    Nonallopathic problems  Decision today to treat with OMT was based on Physical Exam  After verbal consent patient was treated with, ME, FPR techniques in cervical, rib, thoracic, lumbar, and sacral  areas  Patient tolerated the procedure well with improvement in symptoms  Patient given exercises, stretches and lifestyle modifications  See medications in patient instructions if given  Patient will follow up in 4-8 weeks      The above documentation has been reviewed and is accurate and complete Judi Saa, DO        Note: This dictation was prepared with Dragon dictation along with smaller phrase technology. Any transcriptional errors that result from this process are unintentional.

## 2023-06-17 ENCOUNTER — Encounter: Payer: Self-pay | Admitting: Family Medicine

## 2023-06-17 ENCOUNTER — Ambulatory Visit: Payer: PPO | Admitting: Family Medicine

## 2023-06-17 VITALS — BP 110/68 | HR 82 | Ht 65.0 in | Wt 108.0 lb

## 2023-06-17 DIAGNOSIS — M9902 Segmental and somatic dysfunction of thoracic region: Secondary | ICD-10-CM

## 2023-06-17 DIAGNOSIS — M5416 Radiculopathy, lumbar region: Secondary | ICD-10-CM | POA: Diagnosis not present

## 2023-06-17 DIAGNOSIS — M9904 Segmental and somatic dysfunction of sacral region: Secondary | ICD-10-CM

## 2023-06-17 DIAGNOSIS — M9908 Segmental and somatic dysfunction of rib cage: Secondary | ICD-10-CM | POA: Diagnosis not present

## 2023-06-17 DIAGNOSIS — M9901 Segmental and somatic dysfunction of cervical region: Secondary | ICD-10-CM

## 2023-06-17 DIAGNOSIS — M9903 Segmental and somatic dysfunction of lumbar region: Secondary | ICD-10-CM

## 2023-06-17 NOTE — Patient Instructions (Signed)
Good to see you! Good luck with hubby knee See you again in 7-8 weeks

## 2023-06-17 NOTE — Assessment & Plan Note (Signed)
Still seems to be stable at this time.  Increase activity slowly.  Patient is responding well to osteopathic manipulation.  Follow-up again in 6 to 8 weeks.  No other significant changes.

## 2023-07-13 ENCOUNTER — Other Ambulatory Visit (HOSPITAL_BASED_OUTPATIENT_CLINIC_OR_DEPARTMENT_OTHER): Payer: Self-pay

## 2023-07-13 ENCOUNTER — Other Ambulatory Visit: Payer: Self-pay | Admitting: Family Medicine

## 2023-07-13 DIAGNOSIS — Z85820 Personal history of malignant melanoma of skin: Secondary | ICD-10-CM | POA: Diagnosis not present

## 2023-07-13 DIAGNOSIS — C44311 Basal cell carcinoma of skin of nose: Secondary | ICD-10-CM | POA: Diagnosis not present

## 2023-07-13 DIAGNOSIS — Z85828 Personal history of other malignant neoplasm of skin: Secondary | ICD-10-CM | POA: Diagnosis not present

## 2023-07-13 MED ORDER — DOXYCYCLINE HYCLATE 100 MG PO CAPS
100.0000 mg | ORAL_CAPSULE | Freq: Two times a day (BID) | ORAL | 0 refills | Status: DC
  Filled 2023-07-13: qty 10, 5d supply, fill #0

## 2023-07-14 ENCOUNTER — Other Ambulatory Visit: Payer: Self-pay

## 2023-07-14 ENCOUNTER — Other Ambulatory Visit (HOSPITAL_BASED_OUTPATIENT_CLINIC_OR_DEPARTMENT_OTHER): Payer: Self-pay

## 2023-07-14 MED ORDER — METHOCARBAMOL 500 MG PO TABS
500.0000 mg | ORAL_TABLET | Freq: Three times a day (TID) | ORAL | 0 refills | Status: DC | PRN
  Filled 2023-07-14: qty 100, 34d supply, fill #0

## 2023-07-24 NOTE — Progress Notes (Unsigned)
[name] [address] [place] [address] Phone: [phone] Subjective:   Bruce Donath, am serving as a scribe for Dr. Antoine Primas.  I'm seeing this patient by the request  of:  Renford Dills, MD  CC: Low back and neck pain follow-up  [id number]  Tracey [name] is a 72 y.o. female coming in with complaint of back and neck pain. OMT on 06/17/2023. Patient states that she is doing well since last visit.   Medications patient has been prescribed: Robaxin Cymbalta  Taking: Yes         Reviewed prior external information including notes and imaging from previsou exam, outside providers and external EMR if available.   As well as notes that were available from care everywhere and other healthcare systems.  Past medical history, social, surgical and family history all reviewed in electronic medical record.  No pertanent information unless stated regarding to the chief complaint.   Past Medical History:  Diagnosis Date   CAD (coronary artery disease)    Closed hip fracture (HCC) 03/2016   RT HIP   HSV-1 (herpes simplex virus 1) infection    Hypertension    Osteopenia    Osteoporosis    h/o   Pneumonia    Skin cancer    multiple skin cancers   SOB (shortness of breath) 06/14/2013    Allergies  Allergen Reactions   Cefaclor Hives   Abaloparatide     Other reaction(s): headaches, lethargy, fatigue   Adhesive [Tape]    Bacitracin-Polymyxin B Rash   Fosamax [Alendronate Sodium] Nausea Only    Gi upset   Neomycin-Bacitracin Zn-Polymyx Rash     Review of Systems:  No headache, visual changes, nausea, vomiting, diarrhea, constipation, dizziness, abdominal pain, skin rash, fevers, chills, night sweats, weight loss, swollen lymph nodes, body aches, joint swelling, chest pain, shortness of breath, mood changes. POSITIVE muscle aches  Objective  Blood pressure 118/82, pulse 68, height 5\' 5"  (1.651 m), weight 106 lb (48.1  kg), SpO2 98%.   General: No apparent distress alert and oriented x3 mood and affect normal, dressed appropriately.  HEENT: Pupils equal, extraocular movements intact  Respiratory: Patient's speak in full sentences and does not appear short of breath  Cardiovascular: No lower extremity edema, non tender, no erythema  Gait MSK:  Back does have some scoliosis noted.  Does have some loss lordosis noted.  Some tightness with Pearlean Brownie right greater than left.  Osteopathic findings  C5 flexed rotated and side bent right C6 flexed rotated and side bent left T3 extended rotated and side bent right inhaled rib T5 extended rotated and side bent left L2 flexed rotated and side bent right Sacrum right on right       Assessment and Plan:  Sacroiliac dysfunction Exacerbation appears.  I do think that there was a potential for iliolumbar ligament injury as well.  Discussed with patient to continue to monitor and continue to work on core strengthening.  [place]  The patient does walk sometimes on uneven ground.  Discussed which activities to do and which ones to avoid.  Follow-up again in 6 to 8 weeks    Nonallopathic problems  Decision today to treat with OMT was based on Physical Exam  After verbal consent patient was treated with HVLA, ME, FPR techniques in cervical, rib, thoracic, lumbar, and sacral  areas  Patient tolerated the procedure well with improvement in  symptoms  Patient given exercises, stretches and lifestyle modifications  See medications in patient instructions if given  Patient will follow up in 4-8 weeks     The above documentation has been reviewed and is accurate and complete Judi Saa, DO         Note: This dictation was prepared with Dragon dictation along with smaller phrase technology. Any transcriptional errors that result from this process are unintentional.

## 2023-07-28 ENCOUNTER — Encounter: Payer: Self-pay | Admitting: Family Medicine

## 2023-07-28 ENCOUNTER — Ambulatory Visit: Payer: PPO | Admitting: Family Medicine

## 2023-07-28 VITALS — BP 118/82 | HR 68 | Ht 65.0 in | Wt 106.0 lb

## 2023-07-28 DIAGNOSIS — M533 Sacrococcygeal disorders, not elsewhere classified: Secondary | ICD-10-CM

## 2023-07-28 DIAGNOSIS — M9901 Segmental and somatic dysfunction of cervical region: Secondary | ICD-10-CM | POA: Diagnosis not present

## 2023-07-28 DIAGNOSIS — M9903 Segmental and somatic dysfunction of lumbar region: Secondary | ICD-10-CM | POA: Diagnosis not present

## 2023-07-28 DIAGNOSIS — M9908 Segmental and somatic dysfunction of rib cage: Secondary | ICD-10-CM

## 2023-07-28 DIAGNOSIS — M9904 Segmental and somatic dysfunction of sacral region: Secondary | ICD-10-CM | POA: Diagnosis not present

## 2023-07-28 DIAGNOSIS — M9902 Segmental and somatic dysfunction of thoracic region: Secondary | ICD-10-CM | POA: Diagnosis not present

## 2023-07-28 NOTE — Assessment & Plan Note (Signed)
Exacerbation appears.  I do think that there was a potential for iliolumbar ligament injury as well.  Discussed with patient to continue to monitor and continue to work on core strengthening.  [place]  The patient does walk sometimes on uneven ground.  Discussed which activities to do and which ones to avoid.  Follow-up again in 6 to 8 weeks

## 2023-07-28 NOTE — Patient Instructions (Signed)
Careful with puzzle See me in 6-8 weeks

## 2023-08-05 ENCOUNTER — Other Ambulatory Visit: Payer: Self-pay | Admitting: Family Medicine

## 2023-08-06 ENCOUNTER — Other Ambulatory Visit (HOSPITAL_BASED_OUTPATIENT_CLINIC_OR_DEPARTMENT_OTHER): Payer: Self-pay

## 2023-08-06 MED ORDER — DULOXETINE HCL 20 MG PO CPEP
20.0000 mg | ORAL_CAPSULE | Freq: Every day | ORAL | 0 refills | Status: DC
  Filled 2023-08-06: qty 100, 100d supply, fill #0

## 2023-08-07 ENCOUNTER — Other Ambulatory Visit (HOSPITAL_BASED_OUTPATIENT_CLINIC_OR_DEPARTMENT_OTHER): Payer: Self-pay

## 2023-08-10 ENCOUNTER — Other Ambulatory Visit (HOSPITAL_BASED_OUTPATIENT_CLINIC_OR_DEPARTMENT_OTHER): Payer: Self-pay

## 2023-08-11 DIAGNOSIS — L570 Actinic keratosis: Secondary | ICD-10-CM | POA: Diagnosis not present

## 2023-08-11 DIAGNOSIS — D2271 Melanocytic nevi of right lower limb, including hip: Secondary | ICD-10-CM | POA: Diagnosis not present

## 2023-08-11 DIAGNOSIS — Z85828 Personal history of other malignant neoplasm of skin: Secondary | ICD-10-CM | POA: Diagnosis not present

## 2023-08-11 DIAGNOSIS — D2261 Melanocytic nevi of right upper limb, including shoulder: Secondary | ICD-10-CM | POA: Diagnosis not present

## 2023-08-11 DIAGNOSIS — L858 Other specified epidermal thickening: Secondary | ICD-10-CM | POA: Diagnosis not present

## 2023-08-11 DIAGNOSIS — D2262 Melanocytic nevi of left upper limb, including shoulder: Secondary | ICD-10-CM | POA: Diagnosis not present

## 2023-08-11 DIAGNOSIS — L820 Inflamed seborrheic keratosis: Secondary | ICD-10-CM | POA: Diagnosis not present

## 2023-08-11 DIAGNOSIS — Z85820 Personal history of malignant melanoma of skin: Secondary | ICD-10-CM | POA: Diagnosis not present

## 2023-08-11 DIAGNOSIS — D225 Melanocytic nevi of trunk: Secondary | ICD-10-CM | POA: Diagnosis not present

## 2023-08-11 DIAGNOSIS — L821 Other seborrheic keratosis: Secondary | ICD-10-CM | POA: Diagnosis not present

## 2023-08-11 DIAGNOSIS — D1801 Hemangioma of skin and subcutaneous tissue: Secondary | ICD-10-CM | POA: Diagnosis not present

## 2023-08-24 ENCOUNTER — Other Ambulatory Visit (HOSPITAL_BASED_OUTPATIENT_CLINIC_OR_DEPARTMENT_OTHER): Payer: Self-pay

## 2023-08-24 DIAGNOSIS — N1831 Chronic kidney disease, stage 3a: Secondary | ICD-10-CM | POA: Diagnosis not present

## 2023-08-24 DIAGNOSIS — Z5181 Encounter for therapeutic drug level monitoring: Secondary | ICD-10-CM | POA: Diagnosis not present

## 2023-08-24 DIAGNOSIS — M810 Age-related osteoporosis without current pathological fracture: Secondary | ICD-10-CM | POA: Diagnosis not present

## 2023-08-24 DIAGNOSIS — M545 Low back pain, unspecified: Secondary | ICD-10-CM | POA: Diagnosis not present

## 2023-08-24 DIAGNOSIS — E7800 Pure hypercholesterolemia, unspecified: Secondary | ICD-10-CM | POA: Diagnosis not present

## 2023-08-24 DIAGNOSIS — Z0000 Encounter for general adult medical examination without abnormal findings: Secondary | ICD-10-CM | POA: Diagnosis not present

## 2023-08-24 DIAGNOSIS — I10 Essential (primary) hypertension: Secondary | ICD-10-CM | POA: Diagnosis not present

## 2023-08-24 DIAGNOSIS — Z23 Encounter for immunization: Secondary | ICD-10-CM | POA: Diagnosis not present

## 2023-08-24 DIAGNOSIS — R636 Underweight: Secondary | ICD-10-CM | POA: Diagnosis not present

## 2023-08-24 MED ORDER — GABAPENTIN 400 MG PO CAPS
400.0000 mg | ORAL_CAPSULE | Freq: Three times a day (TID) | ORAL | 2 refills | Status: DC
  Filled 2023-08-24: qty 90, 30d supply, fill #0
  Filled 2023-11-02: qty 90, 30d supply, fill #1

## 2023-09-16 NOTE — Progress Notes (Unsigned)
Tracey [name] [address] [place] [address] Phone: [phone] Subjective:   Tracey [name], am serving as a scribe for Dr. Antoine Primas.  I'm seeing this patient by the request  of:  Renford Dills, MD  CC: Low back and neck pain follow-up with  [id number]  Tracey [name] is a 72 y.o. female coming in with complaint of back and neck pain. OMT on 07/28/2023. Patient states overall doing relatively well but does have some increasing in tightness recently.  Patient denies sacral bone pain.  Has been doing a lot more manual labor [place] her husband's knee replacement surgery on Monday.  Medications patient has been prescribed: Cymbalta  Taking:         Reviewed prior external information including notes and imaging from previsou exam, outside providers and external EMR if available.   As well as notes that were available from care everywhere and other healthcare systems.  Past medical history, social, surgical and family history all reviewed in electronic medical record.  No pertanent information unless stated regarding to the chief complaint.   Past Medical History:  Diagnosis Date   CAD (coronary artery disease)    Closed hip fracture (HCC) 03/2016   RT HIP   HSV-1 (herpes simplex virus 1) infection    Hypertension    Osteopenia    Osteoporosis    h/o   Pneumonia    Skin cancer    multiple skin cancers   SOB (shortness of breath) 06/14/2013    Allergies  Allergen Reactions   Cefaclor Hives   Abaloparatide     Other reaction(s): headaches, lethargy, fatigue   Adhesive [Tape]    Bacitracin-Polymyxin B Rash   Fosamax [Alendronate Sodium] Nausea Only    Gi upset   Neomycin-Bacitracin Zn-Polymyx Rash     Review of Systems:  No headache, visual changes, nausea, vomiting, diarrhea, constipation, dizziness, abdominal pain, skin rash, fevers, chills, night sweats, weight  loss, swollen lymph nodes, body aches, joint swelling, chest pain, shortness of breath, mood changes. POSITIVE muscle aches  Objective  Height 5\' 5"  (1.651 m).   General: No apparent distress alert and oriented x3 mood and affect normal, dressed appropriately.  HEENT: Pupils equal, extraocular movements intact  Respiratory: Patient's speak in full sentences and does not appear short of breath  Cardiovascular: No lower extremity edema, non tender, no erythema  Low back does have some loss of lordosis noted.  Some tenderness to palpation in the paraspinal musculature.  Tightness noted in the neck 7 laterally  Osteopathic findings  C3 flexed rotated and side bent right T3 extended rotated and side bent right inhaled rib T9 extended rotated and side bent left L1 flexed rotated and side bent right Sacrum right on right       Assessment and Plan:  Lumbar radiculopathy Increasing in tightness of the lower back secondary to her patient doing a lot more manual labor recently.  Getting the house ready for her husband to have knee replacement.  Increasing in tightness of the lower back secondary to patient doing a lot more manual labor recently.  Getting the house ready for her husband to have knee replacement.  Discussed different exercises that patient can do at home.  Discussed which activities to do and which ones to avoid.  Increase activity slowly otherwise.  Discussed icing regimen and home exercises.  Follow-up again in 6 to 8  weeks.    Nonallopathic problems  Decision today to treat with OMT was based on Physical Exam  After verbal consent patient was treated with HVLA, ME, FPR techniques in cervical, rib, thoracic, lumbar, and sacral  areas  Patient tolerated the procedure well with improvement in symptoms  Patient given exercises, stretches and lifestyle modifications  See medications in patient instructions if given  Patient will follow up in 4-8 weeks    The above  documentation has been reviewed and is accurate and complete Judi Saa, DO          Note: This dictation was prepared with Dragon dictation along with smaller phrase technology. Any transcriptional errors that result from this process are unintentional.

## 2023-09-17 ENCOUNTER — Ambulatory Visit: Payer: PPO | Admitting: Family Medicine

## 2023-09-17 ENCOUNTER — Other Ambulatory Visit (HOSPITAL_BASED_OUTPATIENT_CLINIC_OR_DEPARTMENT_OTHER): Payer: Self-pay

## 2023-09-17 ENCOUNTER — Encounter: Payer: Self-pay | Admitting: Family Medicine

## 2023-09-17 VITALS — BP 120/74 | HR 75 | Ht 65.0 in | Wt 110.0 lb

## 2023-09-17 DIAGNOSIS — M9902 Segmental and somatic dysfunction of thoracic region: Secondary | ICD-10-CM | POA: Diagnosis not present

## 2023-09-17 DIAGNOSIS — M9908 Segmental and somatic dysfunction of rib cage: Secondary | ICD-10-CM

## 2023-09-17 DIAGNOSIS — M9901 Segmental and somatic dysfunction of cervical region: Secondary | ICD-10-CM

## 2023-09-17 DIAGNOSIS — M5416 Radiculopathy, lumbar region: Secondary | ICD-10-CM | POA: Diagnosis not present

## 2023-09-17 DIAGNOSIS — M9904 Segmental and somatic dysfunction of sacral region: Secondary | ICD-10-CM

## 2023-09-17 DIAGNOSIS — M9903 Segmental and somatic dysfunction of lumbar region: Secondary | ICD-10-CM

## 2023-09-17 NOTE — Assessment & Plan Note (Signed)
Increasing in tightness of the lower back secondary to her patient doing a lot more manual labor recently.  Getting the house ready for her husband to have knee replacement.  Increasing in tightness of the lower back secondary to patient doing a lot more manual labor recently.  Getting the house ready for her husband to have knee replacement.  Discussed different exercises that patient can do at home.  Discussed which activities to do and which ones to avoid.  Increase activity slowly otherwise.  Discussed icing regimen and home exercises.  Follow-up again in 6 to 8 weeks.

## 2023-09-17 NOTE — Patient Instructions (Signed)
Good to see you! Wish you the best with hubby See you again in 6 weeks

## 2023-10-12 ENCOUNTER — Other Ambulatory Visit (HOSPITAL_BASED_OUTPATIENT_CLINIC_OR_DEPARTMENT_OTHER): Payer: Self-pay

## 2023-10-23 NOTE — Progress Notes (Signed)
[name] [address] [place] [address] Phone: [phone] Subjective:   Tracey [name], am serving as a scribe for Dr. Antoine Primas.  I'm seeing this patient by the request  of:  Renford Dills, MD  CC: Low back pain  [id number]  Tracey [name] is a 72 y.o. female coming in with complaint of back and neck pain. OMT on 09/17/2023. Patient states same per usual. No new concerns.  Has had done more activity recently taking care of her husband who recently had surgery          Reviewed prior external information including notes and imaging from previsou exam, outside providers and external EMR if available.   As well as notes that were available from care everywhere and other healthcare systems.  Past medical history, social, surgical and family history all reviewed in electronic medical record.  No pertanent information unless stated regarding to the chief complaint.   Past Medical History:  Diagnosis Date   CAD (coronary artery disease)    Closed hip fracture (HCC) 03/2016   RT HIP   HSV-1 (herpes simplex virus 1) infection    Hypertension    Osteopenia    Osteoporosis    h/o   Pneumonia    Skin cancer    multiple skin cancers   SOB (shortness of breath) 06/14/2013    Allergies  Allergen Reactions   Cefaclor Hives   Abaloparatide     Other reaction(s): headaches, lethargy, fatigue   Adhesive [Tape]    Bacitracin-Polymyxin B Rash   Fosamax [Alendronate Sodium] Nausea Only    Gi upset   Neomycin-Bacitracin Zn-Polymyx Rash     Review of Systems:  No headache, visual changes, nausea, vomiting, diarrhea, constipation, dizziness, abdominal pain, skin rash, fevers, chills, night sweats, weight loss, swollen lymph nodes, body aches, joint swelling, chest pain, shortness of breath, mood changes. POSITIVE muscle aches  Objective  Blood pressure 120/80, pulse 74, height 5\' 5"  (1.651 m), weight 111 lb (50.3 kg), SpO2 96%.    General: No apparent distress alert and oriented x3 mood and affect normal, dressed appropriately.  HEENT: Pupils equal, extraocular movements intact  Respiratory: Patient's speak in full sentences and does not appear short of breath  Cardiovascular: No lower extremity edema, non tender, no erythema  Gait MSK:  Back does have some loss lordosis noted.  Some tenderness to palpation in the paraspinal musculature.  Does have some mild scoliosis noted.  Knee exam does have some arthritic changes bilaterally  Osteopathic findings  C6 flexed rotated and side bent left T3 extended rotated and side bent right inhaled rib T8 extended rotated and side bent left L2 flexed rotated and side bent right Sacrum right on right    Assessment and Plan:  Lumbar radiculopathy Chronic problem with tightness noted of the hamstring today.  Discussed icing regimen and home exercises, discussed which activities to do and which ones to avoid.  Increasing activity slowly over the course of next several weeks.  Discussed icing regimen.  Follow-up again in 6 to 8 weeks otherwise.    Nonallopathic problems  Decision today to treat with OMT was based on Physical Exam  After verbal consent patient was treated with, ME, FPR techniques in cervical, rib, thoracic, lumbar, and sacral  areas  Patient tolerated the procedure well with improvement in symptoms  Patient given exercises, stretches and lifestyle modifications  See medications in patient instructions if given  Patient will follow up in  4-8 weeks     The above documentation has been reviewed and is accurate and complete Judi Saa, DO         Note: This dictation was prepared with Dragon dictation along with smaller phrase technology. Any transcriptional errors that result from this process are unintentional.

## 2023-11-02 ENCOUNTER — Other Ambulatory Visit: Payer: Self-pay

## 2023-11-02 ENCOUNTER — Ambulatory Visit: Payer: PPO | Admitting: Cardiology

## 2023-11-02 ENCOUNTER — Other Ambulatory Visit: Payer: Self-pay | Admitting: Family Medicine

## 2023-11-02 ENCOUNTER — Other Ambulatory Visit (HOSPITAL_BASED_OUTPATIENT_CLINIC_OR_DEPARTMENT_OTHER): Payer: Self-pay

## 2023-11-02 MED ORDER — METHOCARBAMOL 500 MG PO TABS
500.0000 mg | ORAL_TABLET | Freq: Three times a day (TID) | ORAL | 0 refills | Status: DC | PRN
  Filled 2023-11-02: qty 100, 34d supply, fill #0

## 2023-11-03 ENCOUNTER — Encounter: Payer: Self-pay | Admitting: Family Medicine

## 2023-11-03 ENCOUNTER — Other Ambulatory Visit (HOSPITAL_BASED_OUTPATIENT_CLINIC_OR_DEPARTMENT_OTHER): Payer: Self-pay

## 2023-11-03 ENCOUNTER — Ambulatory Visit: Payer: PPO | Admitting: Family Medicine

## 2023-11-03 VITALS — BP 120/80 | HR 74 | Ht 65.0 in | Wt 111.0 lb

## 2023-11-03 DIAGNOSIS — M9908 Segmental and somatic dysfunction of rib cage: Secondary | ICD-10-CM

## 2023-11-03 DIAGNOSIS — M9904 Segmental and somatic dysfunction of sacral region: Secondary | ICD-10-CM | POA: Diagnosis not present

## 2023-11-03 DIAGNOSIS — M5416 Radiculopathy, lumbar region: Secondary | ICD-10-CM | POA: Diagnosis not present

## 2023-11-03 DIAGNOSIS — M9903 Segmental and somatic dysfunction of lumbar region: Secondary | ICD-10-CM | POA: Diagnosis not present

## 2023-11-03 DIAGNOSIS — M9901 Segmental and somatic dysfunction of cervical region: Secondary | ICD-10-CM | POA: Diagnosis not present

## 2023-11-03 DIAGNOSIS — M9902 Segmental and somatic dysfunction of thoracic region: Secondary | ICD-10-CM

## 2023-11-03 MED ORDER — FLUAD 0.5 ML IM SUSY
0.5000 mL | PREFILLED_SYRINGE | Freq: Once | INTRAMUSCULAR | 0 refills | Status: AC
  Filled 2023-11-03: qty 0.5, 1d supply, fill #0

## 2023-11-03 NOTE — Assessment & Plan Note (Signed)
Chronic problem with tightness noted of the hamstring today.  Discussed icing regimen and home exercises, discussed which activities to do and which ones to avoid.  Increasing activity slowly over the course of next several weeks.  Discussed icing regimen.  Follow-up again in 6 to 8 weeks otherwise.

## 2023-11-03 NOTE — Patient Instructions (Signed)
Good to see you! See you again in 6-8 weeks 

## 2023-11-06 ENCOUNTER — Ambulatory Visit: Payer: PPO | Attending: Cardiology | Admitting: Internal Medicine

## 2023-11-06 ENCOUNTER — Encounter: Payer: Self-pay | Admitting: Internal Medicine

## 2023-11-06 VITALS — BP 130/68 | Ht 65.5 in | Wt 113.0 lb

## 2023-11-06 DIAGNOSIS — I2510 Atherosclerotic heart disease of native coronary artery without angina pectoris: Secondary | ICD-10-CM | POA: Diagnosis not present

## 2023-11-06 DIAGNOSIS — I10 Essential (primary) hypertension: Secondary | ICD-10-CM | POA: Diagnosis not present

## 2023-11-06 DIAGNOSIS — E785 Hyperlipidemia, unspecified: Secondary | ICD-10-CM | POA: Diagnosis not present

## 2023-11-06 HISTORY — DX: Atherosclerotic heart disease of native coronary artery without angina pectoris: I25.10

## 2023-11-06 NOTE — Patient Instructions (Signed)

## 2023-11-06 NOTE — Progress Notes (Signed)
Cardiology Office Note:    Date:  11/06/2023   ID:  Tracey [name], DOB 10-05-1951, MRN [id number]  PCP:  Renford Dills, MD  Cardiologist:  Christell Constant, MD   Referring MD: Renford Dills, MD   CC: Transition to new cardiologist  History of Present Illness:    Tracey [name] is a 72 y.o. female with a hx of hypertension, hyperlipidemia, and CAD treated with medical therapy.   She previously worked with Dr. Garnette Scheuermann.  She also worked with the [facility] family medicine clinic.    Tracey [name] with a history of hypertension, hyperlipidemia, and coronary artery disease, presents for a routine follow-up. She reports no symptoms of chest pain or shortness of breath. In 2021, she had a high calcium score with moderate RCA disease and grade two diastolic dysfunction. However, her overall testing was negative.  The patient leads an active lifestyle, frequently climbing stairs at her two-level townhouse and walking down to the dock via 56 steps. She also volunteers at a greenhouse, which involves constant movement and standing. However, she reports experiencing shortness of breath and dizziness when climbing stairs in hot weather. She attributes this to being out of condition rather than a cardiovascular issue.  The patient's diet consists of a half bagel with cream cheese for breakfast, peanut butter and jelly for lunch, and dinner at seven. She acknowledges that her diet may not be optimal. She also reports having stopped strength-building exercises due to the COVID-19 pandemic and a back surgery. She has not resumed these [place]  The patient's blood pressure and cholesterol levels are within normal ranges. She has no chest pain or breathing issues. Her cardiovascular health is not a concern at this time.   Past Medical History:  Diagnosis Date   CAD (coronary artery disease)    Closed hip fracture (HCC) 03/2016   RT HIP   Coronary artery disease  involving native coronary artery of native heart without angina pectoris 11/06/2023   HSV-1 (herpes simplex virus 1) infection    Hypertension    Osteopenia    Osteoporosis    h/o   Pneumonia    Skin cancer    multiple skin cancers   SOB (shortness of breath) 06/14/2013    Past Surgical History:  Procedure Laterality Date   CALDWELL LUC     COLONOSCOPY WITH PROPOFOL N/A 03/22/2014   Procedure: COLONOSCOPY WITH PROPOFOL;  Surgeon: Willis Modena, MD;  Location: [facility];  Service: Endoscopy;  Laterality: N/A;   HIP PINNING,CANNULATED Right 03/21/2016   Procedure: CANNULATED HIP PINNING;  Surgeon: Samson Frederic, MD;  Location: [facility] OR;  Service: Orthopedics;  Laterality: Right;   HIP SURGERY     LUMBAR LAMINECTOMY/DECOMPRESSION MICRODISCECTOMY Left 02/14/2019   Procedure: Laminectomy and Foraminotomy - L5-S1 - left;  Surgeon: Tia Alert, MD;  Location[facility] OR;  Service: Neurosurgery;  Laterality: Left;  Laminectomy and Foraminotomy - L5-S1 - left   MOHS SURGERY     NASAL SEPTUM SURGERY     past hx. deviated septum   TONSILLECTOMY      Current Medications: Current Meds  Medication Sig   acetaminophen (TYLENOL) 500 MG tablet Take 1,000 mg by mouth every 6 (six) hours as needed (for pain.).   amLODipine (NORVASC) 5 MG tablet Take 1 tablet (5 mg total) by mouth daily.   aspirin EC 81 MG tablet Take 81 mg by mouth every evening.   CALCIUM MAGNESIUM ZINC PO Take  by mouth.   cholecalciferol (VITAMIN D) 25 MCG (1000 UT) tablet Take 1,000 Units by mouth daily.   DULoxetine (CYMBALTA) 20 MG capsule Take 1 capsule (20 mg total) by mouth daily.   estradiol (ESTRACE) 0.1 MG/GM vaginal cream INSERT 1/2 APPLICATORFUL VAGINALLY 2 (TWO) TIMES A WEEK. ON SUNDAYS & WEDNESDAYS   fish oil-omega-3 fatty acids 1000 MG capsule Take 1,000 mg by mouth 2 (two) times daily.    folic acid (FOLVITE) 1 MG tablet Take 1 tablet (1 mg total) by mouth daily.   gabapentin (NEURONTIN) 400 MG capsule Take 400 mg  by mouth at bedtime.   methocarbamol (ROBAXIN) 500 MG tablet Take 1 tablet (500 mg total) by mouth every 8 (eight) hours as needed for muscle spasms.   metroNIDAZOLE (METROGEL) 1 % gel APPLY TOPICALLY TO THE AFFECTED AREA ONCE OR TWICE DAILY AS NEEDED   Multiple Vitamin (MULTIVITAMIN WITH MINERALS) TABS tablet Take 1 tablet by mouth daily.   Polyethyl Glycol-Propyl Glycol (SYSTANE OP) Place 1 drop into both eyes 2 (two) times daily as needed (Dry/Irritated eyes).   psyllium (METAMUCIL) 58.6 % packet Take 1 packet by mouth daily.   rosuvastatin (CRESTOR) 20 MG tablet Take 1 tablet (20 mg total) by mouth daily.   Zoledronic Acid (RECLAST IV) Inject 1 Dose into the vein once. Once a year     Allergies:   Cefaclor, Abaloparatide, Adhesive [tape], Bacitracin-polymyxin b, Fosamax [alendronate sodium], and Neomycin-bacitracin zn-polymyx   Social History   Socioeconomic History   Marital status: Married    Spouse name: Aurther Loft   Number of children: 1   Years of education: Not on file   Highest education level: Not on file  Occupational History   Occupation: MED TECH    Employer:  [employer]  Tobacco Use   Smoking status: Never   Smokeless tobacco: Never  Vaping Use   Vaping status: Never Used  Substance and Sexual Activity   Alcohol use: Not Currently    Alcohol/week: 0.3 standard drinks of alcohol   Drug use: No   Sexual activity: Not Currently    Partners: Male    Birth control/protection: Post-menopausal    Comment: First IC >16 y/o, <5 Partners  Other Topics Concern   Not on file  Social History Narrative   Not on file   Social Determinants of Health   Financial Resource Strain: Not on file  Food Insecurity: Not on file  Transportation Needs: Not on file  Physical Activity: Not on file  Stress: Not on file  Social Connections: Not on file     Family History: The patient's family history includes Breast cancer in her maternal aunt and paternal aunt; Cancer in her  maternal grandfather; Diabetes in her brother; Heart disease in her paternal grandfather; Hyperlipidemia in her maternal grandmother; Hypertension in her brother, father, and mother; Leukemia in her father; Stroke in her paternal grandfather.  ROS:   Please see the history of present illness.     EKGs/Labs/Other Studies Reviewed:    Cardiac Studies & Procedures     STRESS TESTS  EXERCISE TOLERANCE TEST (ETT) 04/25/2021  Narrative  Blood pressure demonstrated a normal response to exercise.  There was no ST segment deviation noted during stress.  ETT with fair exercise tolerance (6:00); no chest pain; normal blood pressure response; no ST changes; negative adequate exercise tolerance test; Duke treadmill score 6.   ECHOCARDIOGRAM  ECHOCARDIOGRAM COMPLETE 04/20/2020  Narrative ECHOCARDIOGRAM REPORT    Patient Name:  Laquita A [name] Date of Exam: 04/20/2020 Medical Rec #:  [id number]       Height:       66.0 in Accession #:    [id number]      Weight:       102.0 lb Date of Birth:  Mar 31, 1951       BSA:          1.502 m Patient Age:    68 years        BP:           122/91 mmHg Patient Gender: F               HR:           81 bpm. Exam Location:  Inpatient  Procedure: 2D Echo, Color Doppler and Cardiac Doppler  Indications:    Chest pain  History:        Patient has prior history of Echocardiogram examinations, most recent 06/14/2013. Risk Factors:Hypertension. GERD.  Sonographer:    Ross Ludwig RDCS (AE) Referring Phys: [address] [place]   Sonographer Comments: Suboptimal parasternal window and suboptimal subcostal window. Image acquisition challenging due to respiratory motion. IMPRESSIONS   1. Left ventricular ejection fraction, by estimation, is 60 to 65%. The left ventricle has normal function. The left ventricle has no regional wall motion abnormalities. There is moderate left ventricular hypertrophy. Left ventricular diastolic parameters are consistent with Grade II  diastolic dysfunction (pseudonormalization). 2. Right ventricular systolic function is normal. The right ventricular size is normal. 3. The mitral valve is normal in structure. No evidence of mitral valve regurgitation. No evidence of mitral stenosis. 4. The aortic valve has an indeterminant number of cusps. Aortic valve regurgitation is not visualized. No aortic stenosis is present.  FINDINGS Left Ventricle: Left ventricular ejection fraction, by estimation, is 60 to 65%. The left ventricle has normal function. The left ventricle has no regional wall motion abnormalities. The left ventricular internal cavity size was normal in size. There is moderate left ventricular hypertrophy. Left ventricular diastolic parameters are consistent with Grade II diastolic dysfunction (pseudonormalization).  Right Ventricle: The right ventricular size is normal. No increase in right ventricular wall thickness. Right ventricular systolic function is normal.  Left Atrium: Left atrial size was normal in size.  Right Atrium: Right atrial size was normal in size.  Pericardium: There is no evidence of pericardial effusion.  Mitral Valve: The mitral valve is normal in structure. Normal mobility of the mitral valve leaflets. No evidence of mitral valve regurgitation. No evidence of mitral valve stenosis. MV peak gradient, 2.4 mmHg. The mean mitral valve gradient is 1.0 mmHg.  Tricuspid Valve: The tricuspid valve is normal in structure. Tricuspid valve regurgitation is trivial. No evidence of tricuspid stenosis.  Aortic Valve: The aortic valve has an indeterminant number of cusps. Aortic valve regurgitation is not visualized. No aortic stenosis is present. Aortic valve mean gradient measures 2.0 mmHg. Aortic valve peak gradient measures 3.0 mmHg. Aortic valve area, by VTI measures 2.59 cm.  Pulmonic Valve: The pulmonic valve was grossly normal. Pulmonic valve regurgitation is not visualized. No evidence of pulmonic  stenosis.  Aorta: The aortic root is normal in size and structure.  Venous: The inferior vena cava was not well visualized.  IAS/Shunts: No atrial level shunt detected by color flow Doppler.   LEFT VENTRICLE PLAX 2D LVIDd:         2.20 cm  Diastology LVIDs:         1.54  cm  LV e' lateral:   7.72 cm/s LV PW:         1.35 cm  LV E/e' lateral: 8.2 LV IVS:        1.32 cm  LV e' medial:    5.55 cm/s LVOT diam:     2.00 cm  LV E/e' medial:  11.4 LV SV:         45 LV SV Index:   30 LVOT Area:     3.14 cm   RIGHT VENTRICLE RV Basal diam:  2.24 cm RV S prime:     8.18 cm/s TAPSE (M-mode): 1.7 cm  LEFT ATRIUM             Index      RIGHT ATRIUM          Index LA diam:        2.30 cm 1.53 cm/m RA Area:     6.44 cm LA Vol (A2C):   8.8 ml  5.84 ml/m RA Volume:   9.17 ml  6.10 ml/m LA Vol (A4C):   9.2 ml  6.13 ml/m LA Biplane Vol: 9.3 ml  6.18 ml/m AORTIC VALVE AV Area (Vmax):    2.60 cm AV Area (Vmean):   2.26 cm AV Area (VTI):     2.59 cm AV Vmax:           86.20 cm/s AV Vmean:          71.100 cm/s AV VTI:            0.175 m AV Peak Grad:      3.0 mmHg AV Mean Grad:      2.0 mmHg LVOT Vmax:         71.30 cm/s LVOT Vmean:        51.200 cm/s LVOT VTI:          0.144 m LVOT/AV VTI ratio: 0.82  AORTA Ao Root diam: 2.90 cm  MITRAL VALVE MV Area (PHT): 3.60 cm    SHUNTS MV Peak grad:  2.4 mmHg    Systemic VTI:  0.14 m MV Mean grad:  1.0 mmHg    Systemic Diam: 2.00 cm MV Vmax:       0.78 m/s MV Vmean:      45.4 cm/s MV Decel Time: 211 msec MV E velocity: 63.30 cm/s MV A velocity: 66.40 cm/s MV E/A ratio:  0.95  Weston Brass MD Electronically signed by Weston Brass MD Signature Date/Time: 04/20/2020/11:44:51 PM    Final     CT SCANS  CT CORONARY MORPH W/CTA COR W/SCORE 04/20/2020  Addendum 04/23/2020  9:31 AM ADDENDUM REPORT: 04/23/2020 09:28  EXAM: OVER-READ INTERPRETATION  CT CHEST  The following report is an over-read performed by radiologist  Dr. Maryelizabeth Rowan [facility] on 04/23/2020. This over-read does not include interpretation of cardiac or coronary anatomy or pathology. The coronary CTA interpretation by the cardiologist is attached.  COMPARISON:  None.  FINDINGS: Limited view of the lung parenchyma demonstrates no suspicious nodularity. Airways are normal.  Limited view of the mediastinum demonstrates no adenopathy. Esophagus normal.  Limited view of the upper abdomen unremarkable.  Limited view of the skeleton and chest wall is unremarkable.  IMPRESSION: No significant extracardiac findings.   Electronically Signed By: Genevive Bi M.D. On: 04/23/2020 09:28  Narrative CLINICAL DATA:  71 year old with chest pain, abnormal ECG, family history of CAD  EXAM: Cardiac/Coronary  CTA  TECHNIQUE: The patient was scanned on a [employer]  Force scanner.  FINDINGS: A 100 kV prospective scan was triggered in the descending thoracic aorta at 111 HU's. Axial non-contrast 3 mm slices were carried out through the heart. The data set was analyzed on a dedicated work station and scored using the Agatson method. Gantry rotation speed was 250 msecs and collimation was .6 mm. 100mg  beta blockade and 0.8 mg of sl NTG was given. The 3D data set was reconstructed in 5% intervals of the 67-82 % of the R-R cycle. Diastolic phases were analyzed on a dedicated work station using MPR, MIP and VRT modes. The patient received 80 cc of contrast.  Aorta:  Normal size.  No calcifications.  No dissection.  Aortic Valve:  Trileaflet.  No calcifications.  Coronary Arteries:  Normal coronary origin.  Right dominance.  RCA is a large dominant artery that gives rise to PDA and PLA. There is scattered calcified and non calcified plaque (mostly non flow limiting 0-24%, however there is a mid focal lesion of 50-74%). Will send for CT FFR analysis.  Left main is a large artery that gives rise to LAD and LCX  arteries.  LAD is a large vessel that has proximal calcified and non calcified plaque of 0-24%. There is a focal calcified lesion at first diagonal branch 0-24%). LAD then wraps around apex.  LCX is a non-dominant artery that gives rise to one large OM1 branch. There is a focal calcified plaque at the bifurcation of OM and small AV groove circumflex that is 25-49%.  Other findings:  Normal pulmonary vein drainage into the left atrium.  Normal left atrial appendage without a thrombus.  Normal size of the pulmonary artery.  Please see radiology report for non cardiac findings.  IMPRESSION: 1. Coronary calcium score of 704. This was 95 percentile for age and sex matched control.  2. Normal coronary origin with right dominance.  3. Non flow limiting LAD calcified plaque (0-24%), Moderate calcified plaque in circumflex distribution at bifurcation of OM and AV groove circumflex (25-49%) and scattered calcified lesions in proximal and mid RCA up to 50-74% which may be flow limiting. Sending for CT FFR analysis.  Donato Schultz, MD [facility]  Electronically Signed: By: Donato Schultz MD On: 04/20/2020 18:31            Recent Labs: No results found for requested labs within last 365 days.  Recent Lipid Panel    Component Value Date/Time   CHOL 169 06/27/2020 1106   TRIG 92 06/27/2020 1106   HDL 94 06/27/2020 1106   CHOLHDL 1.8 06/27/2020 1106   CHOLHDL 2.4 04/20/2020 0650   VLDL 11 04/20/2020 0650   LDLCALC 59 06/27/2020 1106    Physical Exam:    VS:  BP 130/68   Ht 5' 5.5" (1.664 m)   Wt 113 lb (51.3 kg)   SpO2 99%   BMI 18.52 kg/m     Wt Readings from Last 3 Encounters:  11/06/23 113 lb (51.3 kg)  11/03/23 111 lb (50.3 kg)  09/17/23 110 lb (49.9 kg)   GEN: Slender. No acute distress HEENT: Normal NECK: No JVD. No bruit bilaterally CARDIAC: No murmur. RRR no gallop, or edema. VASCULAR:  Normal Pulses.  RESPIRATORY:  Clear to auscultation without rales,  wheezing or rhonchi  ABDOMEN: Soft, non-tender, non-distended MUSCULOSKELETAL: No deformity  SKIN: Warm and dry NEUROLOGIC:  Alert and oriented x 3 PSYCHIATRIC:  Normal affect   ASSESSMENT:    1. Coronary artery disease involving native coronary artery of native  heart without angina pectoris   2. Essential hypertension   3. Hyperlipidemia LDL goal <70     PLAN:     Coronary Artery Disease (CAD) - Moderate non-obstructive CAD, primarily in the RCA, with a high calcium score in 2021. No current symptoms. Blood pressure and cholesterol are well-controlled. Focus on secondary prevention. Discussed benefits of strength training for skeletal muscle growth, oxygen extraction, and LDL metabolism. Emphasized starting with low intensity and high repetition to avoid musculoskeletal issues. Strength training can reduce medication dependency and improve cardiovascular health. - Continue current medications for CAD - Encourage regular physical activity - Recommend strength training with low intensity and high repetition - Follow up in one year for routine evaluation  Hypertension - Well-controlled with current medication regimen. No recent episodes of elevated blood pressure.  Hyperlipidemia - Cholesterol levels at goal. Diet suboptimal but cholesterol well-controlled. Discussed benefits of dietary modifications and increased protein intake with strength training. - Continue current lipid-lowering therapy - Encourage dietary modifications - Recommend increased protein intake with strength training  General Health Maintenance - Active, engages in regular physical activity, non-smoker. Discussed strength training for overall health and fall prevention. Emphasized low intensity and high repetition exercises. Encouraged considering a personal trainer or group classes. - Encourage strength training with low intensity and high repetition - Consider personal trainer or group classes - Advocate for  continued aerobic activities and volunteer work at the [employer]  Follow-up - Schedule follow-up appointment in one year.      Medication Adjustments/Labs and Tests Ordered: Current medicines are reviewed at length with the patient today.  Concerns regarding medicines are outlined above.  Orders Placed This Encounter  Procedures   EKG 12-Lead   No orders of the defined types were placed in this encounter.   Patient Instructions  Medication Instructions:  Your physician recommends that you continue on your current medications as directed. Please refer to the Current Medication list given to you today.  *If you need a refill on your cardiac medications before your next appointment, please call your pharmacy*   Lab Work: NONE If you have labs (blood work) drawn today and your tests are completely normal, you will receive your results only by: MyChart Message (if you have MyChart) OR A paper copy in the mail If you have any lab test that is abnormal or we need to change your treatment, we will call you to review the results.   Testing/Procedures: NONE   Follow-Up: At [facility], you and your health needs are our priority.  As part of our continuing mission to provide you with exceptional heart care, we have created designated Provider Care Teams.  These Care Teams include your primary Cardiologist (physician) and Advanced Practice Providers (APPs -  Physician Assistants and Nurse Practitioners) who all work together to provide you with the care you need, when you need it.    Your next appointment:   1 year(s)  Provider:   Riley Lam, MD       Signed, Christell Constant, MD  11/06/2023 9:55 AM    [facility]

## 2023-11-10 DIAGNOSIS — D2261 Melanocytic nevi of right upper limb, including shoulder: Secondary | ICD-10-CM | POA: Diagnosis not present

## 2023-11-10 DIAGNOSIS — D225 Melanocytic nevi of trunk: Secondary | ICD-10-CM | POA: Diagnosis not present

## 2023-11-10 DIAGNOSIS — D2271 Melanocytic nevi of right lower limb, including hip: Secondary | ICD-10-CM | POA: Diagnosis not present

## 2023-11-10 DIAGNOSIS — D692 Other nonthrombocytopenic purpura: Secondary | ICD-10-CM | POA: Diagnosis not present

## 2023-11-10 DIAGNOSIS — Z85820 Personal history of malignant melanoma of skin: Secondary | ICD-10-CM | POA: Diagnosis not present

## 2023-11-10 DIAGNOSIS — Z85828 Personal history of other malignant neoplasm of skin: Secondary | ICD-10-CM | POA: Diagnosis not present

## 2023-11-10 DIAGNOSIS — D1801 Hemangioma of skin and subcutaneous tissue: Secondary | ICD-10-CM | POA: Diagnosis not present

## 2023-11-10 DIAGNOSIS — L821 Other seborrheic keratosis: Secondary | ICD-10-CM | POA: Diagnosis not present

## 2023-11-10 DIAGNOSIS — C44519 Basal cell carcinoma of skin of other part of trunk: Secondary | ICD-10-CM | POA: Diagnosis not present

## 2023-11-10 DIAGNOSIS — L570 Actinic keratosis: Secondary | ICD-10-CM | POA: Diagnosis not present

## 2023-11-13 DIAGNOSIS — M62830 Muscle spasm of back: Secondary | ICD-10-CM | POA: Diagnosis not present

## 2023-11-13 DIAGNOSIS — M810 Age-related osteoporosis without current pathological fracture: Secondary | ICD-10-CM | POA: Diagnosis not present

## 2023-11-13 DIAGNOSIS — R0781 Pleurodynia: Secondary | ICD-10-CM | POA: Diagnosis not present

## 2023-11-26 ENCOUNTER — Other Ambulatory Visit: Payer: Self-pay | Admitting: Family Medicine

## 2023-11-26 ENCOUNTER — Other Ambulatory Visit (HOSPITAL_BASED_OUTPATIENT_CLINIC_OR_DEPARTMENT_OTHER): Payer: Self-pay

## 2023-11-26 ENCOUNTER — Telehealth: Payer: Self-pay | Admitting: Internal Medicine

## 2023-11-26 ENCOUNTER — Other Ambulatory Visit: Payer: Self-pay | Admitting: Internal Medicine

## 2023-11-26 DIAGNOSIS — Z1231 Encounter for screening mammogram for malignant neoplasm of breast: Secondary | ICD-10-CM

## 2023-11-26 MED ORDER — ROSUVASTATIN CALCIUM 20 MG PO TABS
20.0000 mg | ORAL_TABLET | Freq: Every day | ORAL | 3 refills | Status: DC
  Filled 2023-11-26: qty 100, 100d supply, fill #0
  Filled 2024-03-01: qty 100, 100d supply, fill #1
  Filled 2024-06-07: qty 100, 100d supply, fill #2
  Filled 2024-09-20: qty 100, 100d supply, fill #3

## 2023-11-26 NOTE — Telephone Encounter (Signed)
*  STAT* If patient is at the pharmacy, call can be transferred to refill team.   1. Which medications need to be refilled? (please list name of each medication and dose if known)   rosuvastatin (CRESTOR) 20 MG tablet   2. Would you like to learn more about the convenience, safety, & potential cost savings by using the [facility]?   3. Are you open to using the [facility] (Type [facility]. ).  4. Which pharmacy/location (including street and city if local pharmacy) is medication to be sent to?  [facility]   5. Do they need a 30 day or 90 day supply?   90 day  Patient stated she only has 3-4 tablets left.

## 2023-11-26 NOTE — Telephone Encounter (Signed)
Pt's medication was sent to pt's pharmacy as requested. Confirmation received.  °

## 2023-11-27 ENCOUNTER — Other Ambulatory Visit (HOSPITAL_BASED_OUTPATIENT_CLINIC_OR_DEPARTMENT_OTHER): Payer: Self-pay

## 2023-11-27 MED ORDER — DULOXETINE HCL 20 MG PO CPEP
20.0000 mg | ORAL_CAPSULE | Freq: Every day | ORAL | 0 refills | Status: DC
  Filled 2023-11-27: qty 100, 100d supply, fill #0

## 2023-12-18 NOTE — Progress Notes (Unsigned)
[name] [address] [place] [address] Phone: [phone] Subjective:   Bruce Donath, am serving as a scribe for Dr. Antoine Primas.  I'm seeing this patient by the request  of:  Renford Dills, MD  CC: Back and neck pain  ONG:[id number]  Tracey [name] is a 73 y.o. female coming in with complaint of back and neck pain. OMT on 11/03/2023. Patient states continues to have some discomfort and pain as well.  Patient states nothing that stopping her from activity.  He would actually feel if anything she is getting somewhat better.  Medications patient has been prescribed: cymbalta robaxin  Taking:         Reviewed prior external information including notes and imaging from previsou exam, outside providers and external EMR if available.   As well as notes that were available from care everywhere and other healthcare systems.  Past medical history, social, surgical and family history all reviewed in electronic medical record.  No pertanent information unless stated regarding to the chief complaint.   Past Medical History:  Diagnosis Date   CAD (coronary artery disease)    Closed hip fracture (HCC) 03/2016   RT HIP   Coronary artery disease involving native coronary artery of native heart without angina pectoris 11/06/2023   HSV-1 (herpes simplex virus 1) infection    Hypertension    Osteopenia    Osteoporosis    h/o   Pneumonia    Skin cancer    multiple skin cancers   SOB (shortness of breath) 06/14/2013    Allergies  Allergen Reactions   Cefaclor Hives   Abaloparatide     Other reaction(s): headaches, lethargy, fatigue   Adhesive [Tape]    Bacitracin-Polymyxin B Rash   Fosamax [Alendronate Sodium] Nausea Only    Gi upset   Neomycin-Bacitracin Zn-Polymyx Rash     Review of Systems:  No headache, visual changes, nausea, vomiting, diarrhea, constipation, dizziness, abdominal pain, skin rash, fevers, chills, night sweats,  weight loss, swollen lymph nodes, body aches, joint swelling, chest pain, shortness of breath, mood changes. POSITIVE muscle aches  Objective  Blood pressure 124/76, pulse 96, height 5' 5.5" (1.664 m), weight 111 lb (50.3 kg).   General: No apparent distress alert and oriented x3 mood and affect normal, dressed appropriately.  HEENT: Pupils equal, extraocular movements intact  Respiratory: Patient's speak in full sentences and does not appear short of breath  Cardiovascular: No lower extremity edema, non tender, no erythema  MSK:  Back does have some loss of lordosis.  Does have some tightness with Pearlean Brownie right greater than left.  Mild pain in the thoracolumbar junction.  Osteopathic findings  T6 extended rotated and side bent left L1 flexed rotated and side bent right Sacrum right on right       Assessment and Plan:  Sacroiliac dysfunction Chronic difficulty.  Seems to be more in the lower back.  Seems to be more in the thoracolumbar juncture as well.  No radiation of the pain.  Continues to respond well to osteopathic manipulation.  Follow-up again in 6 to 8 weeks    Nonallopathic problems  Decision today to treat with OMT was based on Physical Exam  After verbal consent patient was treated with HVLA, ME, FPR techniques in thoracic, lumbar, and sacral  areas  Patient tolerated the procedure well with improvement in symptoms  Patient given exercises, stretches and lifestyle modifications  See medications in patient instructions if given  Patient  will follow up in 6-8 weeks    The above documentation has been reviewed and is accurate and complete Judi Saa, DO          Note: This dictation was prepared with Dragon dictation along with smaller phrase technology. Any transcriptional errors that result from this process are unintentional.

## 2023-12-22 ENCOUNTER — Ambulatory Visit
Admission: RE | Admit: 2023-12-22 | Discharge: 2023-12-22 | Disposition: A | Discharge status: Home / Self Care | Payer: PPO | Source: Ambulatory Visit | Attending: Internal Medicine | Admitting: Internal Medicine

## 2023-12-22 ENCOUNTER — Encounter: Payer: Self-pay | Admitting: Family Medicine

## 2023-12-22 ENCOUNTER — Ambulatory Visit: Payer: PPO | Admitting: Family Medicine

## 2023-12-22 VITALS — BP 124/76 | HR 96 | Ht 65.5 in | Wt 111.0 lb

## 2023-12-22 DIAGNOSIS — M9902 Segmental and somatic dysfunction of thoracic region: Secondary | ICD-10-CM

## 2023-12-22 DIAGNOSIS — M9904 Segmental and somatic dysfunction of sacral region: Secondary | ICD-10-CM

## 2023-12-22 DIAGNOSIS — M9903 Segmental and somatic dysfunction of lumbar region: Secondary | ICD-10-CM

## 2023-12-22 DIAGNOSIS — Z1231 Encounter for screening mammogram for malignant neoplasm of breast: Secondary | ICD-10-CM

## 2023-12-22 DIAGNOSIS — M533 Sacrococcygeal disorders, not elsewhere classified: Secondary | ICD-10-CM

## 2023-12-22 NOTE — Assessment & Plan Note (Signed)
Chronic difficulty.  Seems to be more in the lower back.  Seems to be more in the thoracolumbar juncture as well.  No radiation of the pain.  Continues to respond well to osteopathic manipulation.  Follow-up again in 6 to 8 weeks

## 2023-12-22 NOTE — Patient Instructions (Signed)
Good to see you! Stay away from people See you again in 6-8 weeks

## 2024-01-27 NOTE — Progress Notes (Signed)
 [name] [address] [place] [address] Phone: [phone] Subjective:   Tracey [name], am serving as a scribe for Dr. Antoine [name].  I'm seeing this patient by the request  of:  Tracey Dills, MD  CC: Lower back with left leg pain  [id number]  Tracey [name] is a 73 y.o. female coming in with complaint of back and neck pain. OMT on 12/22/2023. Patient states that she did something to her lower back in January when she was putting away Christmas tree. Had pain since. Pain on L ischial tuberosity and into there hamstring down into the calf. Tingling sensation.   Medications patient has been prescribed: Cymbalta  Taking: Yes         Reviewed prior external information including notes and imaging from previsou exam, outside providers and external EMR if available.   As well as notes that were available from care everywhere and other healthcare systems.  Past medical history, social, surgical and family history all reviewed in electronic medical record.  No pertanent information unless stated regarding to the chief complaint.   Past Medical History:  Diagnosis Date   CAD (coronary artery disease)    Closed hip fracture (HCC) 03/2016   RT HIP   Coronary artery disease involving native coronary artery of native heart without angina pectoris 11/06/2023   HSV-1 (herpes simplex virus 1) infection    Hypertension    Osteopenia    Osteoporosis    h/o   Pneumonia    Skin cancer    multiple skin cancers   SOB (shortness of breath) 06/14/2013    Allergies  Allergen Reactions   Cefaclor Hives   Abaloparatide     Other reaction(s): headaches, lethargy, fatigue   Adhesive [Tape]    Bacitracin-Polymyxin B Rash   Fosamax [Alendronate Sodium] Nausea Only    Gi upset   Neomycin-Bacitracin Zn-Polymyx Rash     Review of Systems:  No headache, visual changes, nausea, vomiting, diarrhea, constipation, dizziness, abdominal pain, skin  rash, fevers, chills, night sweats, weight loss, swollen lymph nodes, body aches, joint swelling, chest pain, shortness of breath, mood changes. POSITIVE muscle aches  Objective  Blood pressure 124/84, pulse 73, height 5' 5.5" (1.664 m), weight 109 lb (49.4 kg), SpO2 99%.   General: No apparent distress alert and oriented x3 mood and affect normal, dressed appropriately.  HEENT: Pupils equal, extraocular movements intact  Respiratory: Patient's speak in full sentences and does not appear short of breath  Cardiovascular: No lower extremity edema, non tender, no erythema  Gait normal  MSK:  Back does have some loss lordosis noted.  Some tightness noted of FABER test on the right but unfortunately tightness with straight leg test on the left.  Osteopathic findings  C2 flexed rotated and side bent right C7 flexed rotated and side bent left T3 extended rotated and side bent right inhaled rib T5 extended rotated and side bent left L1 flexed rotated and side bent right Sacrum right on right       Assessment and Plan:  Lumbar radiculopathy Patient does have some crepitus noted.  Some tenderness to palpation in the paraspinal musculature.  Patient does have tightness noted on the straight leg test on the left side but no true radicular symptoms.  We have discussed with patient about getting the possibility of further x-rays.  Has a status post lumbar laminectomy we will monitor if this is more of an adjacent segment disease within the x-ray  today.  Follow-up with me again in 6 to 8 weeks    Nonallopathic problems  Decision today to treat with OMT was based on Physical Exam  After verbal consent patient was treated with HVLA, ME, FPR techniques in cervical, rib, thoracic, lumbar, and sacral  areas  Patient tolerated the procedure well with improvement in symptoms  Patient given exercises, stretches and lifestyle modifications  See medications in patient instructions if  given  Patient will follow up in 4-8 weeks    The above documentation has been reviewed and is accurate and complete Tracey Saa, DO          Note: This dictation was prepared with Dragon dictation along with smaller phrase technology. Any transcriptional errors that result from this process are unintentional.

## 2024-02-02 ENCOUNTER — Ambulatory Visit: Payer: PPO | Admitting: Family Medicine

## 2024-02-02 ENCOUNTER — Encounter: Payer: Self-pay | Admitting: Family Medicine

## 2024-02-02 VITALS — BP 124/84 | HR 73 | Ht 65.5 in | Wt 109.0 lb

## 2024-02-02 DIAGNOSIS — M2550 Pain in unspecified joint: Secondary | ICD-10-CM

## 2024-02-02 DIAGNOSIS — Z9889 Other specified postprocedural states: Secondary | ICD-10-CM | POA: Diagnosis not present

## 2024-02-02 DIAGNOSIS — M9904 Segmental and somatic dysfunction of sacral region: Secondary | ICD-10-CM

## 2024-02-02 DIAGNOSIS — M9901 Segmental and somatic dysfunction of cervical region: Secondary | ICD-10-CM

## 2024-02-02 DIAGNOSIS — M9903 Segmental and somatic dysfunction of lumbar region: Secondary | ICD-10-CM | POA: Diagnosis not present

## 2024-02-02 DIAGNOSIS — M5416 Radiculopathy, lumbar region: Secondary | ICD-10-CM

## 2024-02-02 DIAGNOSIS — M9902 Segmental and somatic dysfunction of thoracic region: Secondary | ICD-10-CM

## 2024-02-02 DIAGNOSIS — M9908 Segmental and somatic dysfunction of rib cage: Secondary | ICD-10-CM | POA: Diagnosis not present

## 2024-02-02 MED ORDER — METHYLPREDNISOLONE ACETATE 40 MG/ML IJ SUSP
40.0000 mg | Freq: Once | INTRAMUSCULAR | Status: AC
  Administered 2024-02-02: 40 mg via INTRAMUSCULAR

## 2024-02-02 MED ORDER — KETOROLAC TROMETHAMINE 30 MG/ML IJ SOLN
30.0000 mg | Freq: Once | INTRAMUSCULAR | Status: AC
  Administered 2024-02-02: 30 mg via INTRAMUSCULAR

## 2024-02-02 NOTE — Assessment & Plan Note (Signed)
 Patient does have some crepitus noted.  Some tenderness to palpation in the paraspinal musculature.  Patient does have tightness noted on the straight leg test on the left side but no true radicular symptoms.  We have discussed with patient about getting the possibility of further x-rays.  Has a status post lumbar laminectomy we will monitor if this is more of an adjacent segment disease within the x-ray today.  Follow-up with me again in 6 to 8 weeks

## 2024-02-02 NOTE — Patient Instructions (Signed)
 Cocktail injection today See you again in 4-6 weeks

## 2024-02-02 NOTE — Assessment & Plan Note (Signed)
 Toradol and Depo-Medrol given today

## 2024-02-09 DIAGNOSIS — L820 Inflamed seborrheic keratosis: Secondary | ICD-10-CM | POA: Diagnosis not present

## 2024-02-09 DIAGNOSIS — L570 Actinic keratosis: Secondary | ICD-10-CM | POA: Diagnosis not present

## 2024-02-09 DIAGNOSIS — B078 Other viral warts: Secondary | ICD-10-CM | POA: Diagnosis not present

## 2024-02-09 DIAGNOSIS — D2272 Melanocytic nevi of left lower limb, including hip: Secondary | ICD-10-CM | POA: Diagnosis not present

## 2024-02-09 DIAGNOSIS — D1801 Hemangioma of skin and subcutaneous tissue: Secondary | ICD-10-CM | POA: Diagnosis not present

## 2024-02-09 DIAGNOSIS — L821 Other seborrheic keratosis: Secondary | ICD-10-CM | POA: Diagnosis not present

## 2024-02-09 DIAGNOSIS — L814 Other melanin hyperpigmentation: Secondary | ICD-10-CM | POA: Diagnosis not present

## 2024-02-09 DIAGNOSIS — C44212 Basal cell carcinoma of skin of right ear and external auricular canal: Secondary | ICD-10-CM | POA: Diagnosis not present

## 2024-02-09 DIAGNOSIS — D2271 Melanocytic nevi of right lower limb, including hip: Secondary | ICD-10-CM | POA: Diagnosis not present

## 2024-02-09 DIAGNOSIS — L720 Epidermal cyst: Secondary | ICD-10-CM | POA: Diagnosis not present

## 2024-02-09 DIAGNOSIS — C44712 Basal cell carcinoma of skin of right lower limb, including hip: Secondary | ICD-10-CM | POA: Diagnosis not present

## 2024-02-09 DIAGNOSIS — D485 Neoplasm of uncertain behavior of skin: Secondary | ICD-10-CM | POA: Diagnosis not present

## 2024-02-10 ENCOUNTER — Other Ambulatory Visit (HOSPITAL_BASED_OUTPATIENT_CLINIC_OR_DEPARTMENT_OTHER): Payer: Self-pay

## 2024-02-15 ENCOUNTER — Other Ambulatory Visit: Payer: Self-pay | Admitting: Family Medicine

## 2024-02-16 ENCOUNTER — Other Ambulatory Visit (HOSPITAL_BASED_OUTPATIENT_CLINIC_OR_DEPARTMENT_OTHER): Payer: Self-pay

## 2024-02-16 MED ORDER — METHOCARBAMOL 500 MG PO TABS
500.0000 mg | ORAL_TABLET | Freq: Three times a day (TID) | ORAL | 0 refills | Status: DC | PRN
  Filled 2024-02-16: qty 100, 34d supply, fill #0

## 2024-02-23 DIAGNOSIS — Z961 Presence of intraocular lens: Secondary | ICD-10-CM | POA: Diagnosis not present

## 2024-02-26 NOTE — Progress Notes (Unsigned)
 [name] [address] [place] [address] Phone: [phone] Subjective:   [name], am serving as a scribe for Dr. Antoine Primas.  I'm seeing this patient by the request  of:  Renford Dills, MD  CC: Back and neck pain follow-up  [id number]  Tracey [name] is a 73 y.o. female coming in with complaint of back and neck pain. OMT on 02/02/2024. Patient states pain behind the ear on the R side. This pain started this past Friday patient feels that the right side of her neck has been very bad.  Recently has had worsening allergies as well.  Medications patient has been prescribed: Robaxin  Taking:         Reviewed prior external information including notes and imaging from previsou exam, outside providers and external EMR if available.   As well as notes that were available from care everywhere and other healthcare systems.  Past medical history, social, surgical and family history all reviewed in electronic medical record.  No pertanent information unless stated regarding to the chief complaint.   Past Medical History:  Diagnosis Date   CAD (coronary artery disease)    Closed hip fracture (HCC) 03/2016   RT HIP   Coronary artery disease involving native coronary artery of native heart without angina pectoris 11/06/2023   HSV-1 (herpes simplex virus 1) infection    Hypertension    Osteopenia    Osteoporosis    h/o   Pneumonia    Skin cancer    multiple skin cancers   SOB (shortness of breath) 06/14/2013    Allergies  Allergen Reactions   Cefaclor Hives   Abaloparatide     Other reaction(s): headaches, lethargy, fatigue   Adhesive [Tape]    Bacitracin-Polymyxin B Rash   Fosamax [Alendronate Sodium] Nausea Only    Gi upset   Neomycin-Bacitracin Zn-Polymyx Rash     Review of Systems:  No  visual changes, nausea, vomiting, diarrhea, constipation, dizziness, abdominal pain, skin rash, fevers, chills, night sweats,  weight loss, swollen lymph nodes, body aches, joint swelling, chest pain, shortness of breath, mood changes. POSITIVE muscle aches, headache  Objective  Blood pressure 114/78, pulse 78, height 5\' 5"  (1.651 m), weight 107 lb (48.5 kg), SpO2 96%.   General: No apparent distress alert and oriented x3 mood and affect normal, dressed appropriately.  HEENT: Pupils equal, extraocular movements intact  Respiratory: Patient's speak in full sentences and does not appear short of breath  Cardiovascular: No lower extremity edema, non tender, no erythema  Gait MSK:  Back does have some loss lordosis.  Tightness noted with Pearlean Brownie right greater than left.  Negative straight leg test noted today.  Worsening pain with extension especially over the right sacroiliac joint Neck exam actually shows the patient has severe tenderness over the right mastoid.  Tender to palpation.  Even to light palpation  Osteopathic findings  C6 flexed rotated and side bent left T3 extended rotated and side bent right inhaled rib L2 flexed rotated and side bent right L4 flexed rotated and side bent left Sacrum right on right       Assessment and Plan:  Sacroiliac dysfunction Continues to have some difficulty with the sacroiliac joint.  Does have some known arthritic changes that we will continue to monitor as well.  Responds well to osteopathic manipulation.  Discussed with patient about monitoring for any type of lumbar radiculopathy that could be also potentially contributing.  Follow-up with me again  in 6 to 8 weeks  Mastoiditis Likely right-sided.  Severely tender to palpation.  No fever noted today though.  Will give doxycycline and see how responds.  Has made patient not be able to be active.  No pain over the temporal artery.  No signs of any other type of vascular compromise.  Patient knows that that does not seem to get better or worsen do need to seek medical attention.  May need imaging.    Nonallopathic  problems  Decision today to treat with OMT was based on Physical Exam  After verbal consent patient was treated with  ME, FPR techniques in cervical, rib, thoracic, lumbar, and sacral  areas  Patient tolerated the procedure well with improvement in symptoms  Patient given exercises, stretches and lifestyle modifications  See medications in patient instructions if given  Patient will follow up in 4-8 weeks    The above documentation has been reviewed and is accurate and complete Judi Saa, DO          Note: This dictation was prepared with Dragon dictation along with smaller phrase technology. Any transcriptional errors that result from this process are unintentional.

## 2024-03-01 ENCOUNTER — Other Ambulatory Visit: Payer: Self-pay | Admitting: Family Medicine

## 2024-03-02 ENCOUNTER — Other Ambulatory Visit (HOSPITAL_BASED_OUTPATIENT_CLINIC_OR_DEPARTMENT_OTHER): Payer: Self-pay

## 2024-03-02 ENCOUNTER — Ambulatory Visit: Admitting: Family Medicine

## 2024-03-02 ENCOUNTER — Other Ambulatory Visit: Payer: Self-pay

## 2024-03-02 VITALS — BP 114/78 | HR 78 | Ht 65.0 in | Wt 107.0 lb

## 2024-03-02 DIAGNOSIS — M9908 Segmental and somatic dysfunction of rib cage: Secondary | ICD-10-CM

## 2024-03-02 DIAGNOSIS — M533 Sacrococcygeal disorders, not elsewhere classified: Secondary | ICD-10-CM | POA: Diagnosis not present

## 2024-03-02 DIAGNOSIS — M9903 Segmental and somatic dysfunction of lumbar region: Secondary | ICD-10-CM

## 2024-03-02 DIAGNOSIS — M810 Age-related osteoporosis without current pathological fracture: Secondary | ICD-10-CM

## 2024-03-02 DIAGNOSIS — M9902 Segmental and somatic dysfunction of thoracic region: Secondary | ICD-10-CM

## 2024-03-02 DIAGNOSIS — M9904 Segmental and somatic dysfunction of sacral region: Secondary | ICD-10-CM

## 2024-03-02 DIAGNOSIS — H7091 Unspecified mastoiditis, right ear: Secondary | ICD-10-CM | POA: Diagnosis not present

## 2024-03-02 DIAGNOSIS — M9901 Segmental and somatic dysfunction of cervical region: Secondary | ICD-10-CM

## 2024-03-02 MED ORDER — DOXYCYCLINE HYCLATE 100 MG PO TABS
100.0000 mg | ORAL_TABLET | Freq: Two times a day (BID) | ORAL | 0 refills | Status: DC
  Filled 2024-03-02: qty 28, 14d supply, fill #0

## 2024-03-02 MED ORDER — GABAPENTIN 400 MG PO CAPS
400.0000 mg | ORAL_CAPSULE | Freq: Three times a day (TID) | ORAL | 3 refills | Status: AC | PRN
  Filled 2024-03-02: qty 100, 34d supply, fill #0
  Filled 2024-06-07: qty 100, 34d supply, fill #1
  Filled 2024-09-05: qty 100, 34d supply, fill #2
  Filled 2024-12-17: qty 100, 34d supply, fill #3

## 2024-03-02 MED ORDER — DULOXETINE HCL 20 MG PO CPEP
20.0000 mg | ORAL_CAPSULE | Freq: Every day | ORAL | 0 refills | Status: DC
  Filled 2024-03-02: qty 100, 100d supply, fill #0

## 2024-03-02 NOTE — Patient Instructions (Addendum)
 Watch the blood pressure, but looks great Doxycycline 2 weeks See you again in 6-8 weeks

## 2024-03-03 ENCOUNTER — Encounter: Payer: Self-pay | Admitting: Family Medicine

## 2024-03-03 ENCOUNTER — Other Ambulatory Visit (HOSPITAL_BASED_OUTPATIENT_CLINIC_OR_DEPARTMENT_OTHER): Payer: Self-pay

## 2024-03-03 DIAGNOSIS — H7090 Unspecified mastoiditis, unspecified ear: Secondary | ICD-10-CM | POA: Insufficient documentation

## 2024-03-03 NOTE — Assessment & Plan Note (Signed)
 Likely right-sided.  Severely tender to palpation.  No fever noted today though.  Will give doxycycline and see how responds.  Has made patient not be able to be active.  No pain over the temporal artery.  No signs of any other type of vascular compromise.  Patient knows that that does not seem to get better or worsen do need to seek medical attention.  May need imaging.

## 2024-03-03 NOTE — Assessment & Plan Note (Signed)
 Continue to work on bone health

## 2024-03-03 NOTE — Assessment & Plan Note (Signed)
 Continues to have some difficulty with the sacroiliac joint.  Does have some known arthritic changes that we will continue to monitor as well.  Responds well to osteopathic manipulation.  Discussed with patient about monitoring for any type of lumbar radiculopathy that could be also potentially contributing.  Follow-up with me again in 6 to 8 weeks

## 2024-03-23 ENCOUNTER — Other Ambulatory Visit (HOSPITAL_BASED_OUTPATIENT_CLINIC_OR_DEPARTMENT_OTHER): Payer: Self-pay

## 2024-03-23 DIAGNOSIS — C44212 Basal cell carcinoma of skin of right ear and external auricular canal: Secondary | ICD-10-CM | POA: Diagnosis not present

## 2024-03-23 DIAGNOSIS — Z85828 Personal history of other malignant neoplasm of skin: Secondary | ICD-10-CM | POA: Diagnosis not present

## 2024-03-23 DIAGNOSIS — Z85820 Personal history of malignant melanoma of skin: Secondary | ICD-10-CM | POA: Diagnosis not present

## 2024-03-23 MED ORDER — DOXYCYCLINE HYCLATE 100 MG PO CAPS
100.0000 mg | ORAL_CAPSULE | Freq: Two times a day (BID) | ORAL | 0 refills | Status: DC
Start: 2024-03-23 — End: 2024-06-08
  Filled 2024-03-23: qty 10, 5d supply, fill #0

## 2024-04-04 ENCOUNTER — Emergency Department (HOSPITAL_BASED_OUTPATIENT_CLINIC_OR_DEPARTMENT_OTHER)
Admission: EM | Admit: 2024-04-04 | Discharge: 2024-04-04 | Disposition: A | Discharge status: Home / Self Care | Attending: Emergency Medicine | Admitting: Emergency Medicine

## 2024-04-04 ENCOUNTER — Emergency Department (HOSPITAL_BASED_OUTPATIENT_CLINIC_OR_DEPARTMENT_OTHER): Admitting: Radiology

## 2024-04-04 ENCOUNTER — Encounter (HOSPITAL_BASED_OUTPATIENT_CLINIC_OR_DEPARTMENT_OTHER): Payer: Self-pay | Admitting: Emergency Medicine

## 2024-04-04 ENCOUNTER — Other Ambulatory Visit: Payer: Self-pay

## 2024-04-04 DIAGNOSIS — I10 Essential (primary) hypertension: Secondary | ICD-10-CM | POA: Diagnosis not present

## 2024-04-04 DIAGNOSIS — I2510 Atherosclerotic heart disease of native coronary artery without angina pectoris: Secondary | ICD-10-CM | POA: Diagnosis not present

## 2024-04-04 DIAGNOSIS — Z79899 Other long term (current) drug therapy: Secondary | ICD-10-CM | POA: Diagnosis not present

## 2024-04-04 DIAGNOSIS — R0789 Other chest pain: Secondary | ICD-10-CM | POA: Diagnosis not present

## 2024-04-04 DIAGNOSIS — J929 Pleural plaque without asbestos: Secondary | ICD-10-CM | POA: Diagnosis not present

## 2024-04-04 DIAGNOSIS — R079 Chest pain, unspecified: Secondary | ICD-10-CM

## 2024-04-04 DIAGNOSIS — Z7982 Long term (current) use of aspirin: Secondary | ICD-10-CM | POA: Diagnosis not present

## 2024-04-04 LAB — HEPATIC FUNCTION PANEL
ALT: 22 U/L (ref 0–44)
AST: 31 U/L (ref 15–41)
Albumin: 4.6 g/dL (ref 3.5–5.0)
Alkaline Phosphatase: 68 U/L (ref 38–126)
Bilirubin, Direct: 0.2 mg/dL (ref 0.0–0.2)
Indirect Bilirubin: 0.2 mg/dL — ABNORMAL LOW (ref 0.3–0.9)
Total Bilirubin: 0.5 mg/dL (ref 0.0–1.2)
Total Protein: 6.9 g/dL (ref 6.5–8.1)

## 2024-04-04 LAB — CBC
HCT: 43.4 % (ref 36.0–46.0)
Hemoglobin: 14.5 g/dL (ref 12.0–15.0)
MCH: 28.7 pg (ref 26.0–34.0)
MCHC: 33.4 g/dL (ref 30.0–36.0)
MCV: 85.8 fL (ref 80.0–100.0)
Platelets: 200 10*3/uL (ref 150–400)
RBC: 5.06 MIL/uL (ref 3.87–5.11)
RDW: 13.2 % (ref 11.5–15.5)
WBC: 5.3 10*3/uL (ref 4.0–10.5)
nRBC: 0 % (ref 0.0–0.2)

## 2024-04-04 LAB — BASIC METABOLIC PANEL WITH GFR
Anion gap: 11 (ref 5–15)
BUN: 18 mg/dL (ref 8–23)
CO2: 27 mmol/L (ref 22–32)
Calcium: 10.6 mg/dL — ABNORMAL HIGH (ref 8.9–10.3)
Chloride: 100 mmol/L (ref 98–111)
Creatinine, Ser: 1 mg/dL (ref 0.44–1.00)
GFR, Estimated: 59 mL/min — ABNORMAL LOW (ref >60)
Glucose, Bld: 96 mg/dL (ref 70–99)
Potassium: 3.9 mmol/L (ref 3.5–5.1)
Sodium: 139 mmol/L (ref 135–145)

## 2024-04-04 LAB — TROPONIN T, HIGH SENSITIVITY: Troponin T High Sensitivity: <15 ng/L (ref <19)

## 2024-04-04 LAB — LIPASE, BLOOD: Lipase: 54 U/L — ABNORMAL HIGH (ref 11–51)

## 2024-04-04 MED ORDER — ASPIRIN 81 MG PO CHEW
324.0000 mg | CHEWABLE_TABLET | Freq: Once | ORAL | Status: AC
  Administered 2024-04-04: 324 mg via ORAL
  Filled 2024-04-04: qty 4

## 2024-04-04 NOTE — ED Notes (Addendum)
 Discharge paperwork given and verbally understood. Provider aware of V/S and cleared for DC.Tracey AasAaron [name]

## 2024-04-04 NOTE — ED Provider Notes (Signed)
 [facility] AT [facility] Provider Note   CSN: [id number] Arrival date & time: 04/04/24  1834     History  Chief Complaint  Patient presents with   Chest Pain    Tracey [name] is a 73 y.o. female.  Pt is a 72y/o female with hx of hypertension, hyperlipidemia, and CAD treated with medical therapy. In 2021, she had a high calcium  score with moderate RCA disease and grade two diastolic dysfunction. However, her overall testing was negative and she did not have catheterization or intervention who is presenting today because of complaints of some tightness in her lower chest area bilaterally with associated nausea, mild shortness of breath, diaphoresis.  She reports it started around 1230 or 1 today.  It was at its worst around 2-3 and is now improving.  She reports now the tightness may actually be gone but she still feels a little bit nauseated.  She has not had any significant abdominal pain.  She has used the bathroom for a bowel movement 4 times today but reports that is not always unusual for her because she ate Swiss chard and wild [place] last night and that is not something she is used to eating.  She denies any urinary symptoms.  No cough, congestion, fever nasal discharge.  Nobody else is feeling unwell.  She denies any pleuritic symptoms.  Symptoms do not seem to be worse with eating but she has not eaten much today.  No swelling in her legs.  No unilateral leg pain.  No prior history of PE.  Patient does not use tobacco products.  She is compliant with her medications and there have been no recent changes.  The history is provided by the patient, the spouse and medical records.  Chest Pain      Home Medications Prior to Admission medications   Medication Sig Start Date End Date Taking? Authorizing Provider  acetaminophen  (TYLENOL ) 500 MG tablet Take 1,000 mg by mouth every 6 (six) hours as needed (for pain.).    [provider]  amLODipine   (NORVASC ) 5 MG tablet Take 1 tablet (5 mg total) by mouth daily. 10/01/22     amLODipine  (NORVASC ) 5 MG tablet Take 1 tablet (5 mg total) by mouth daily. 04/07/23     aspirin  EC 81 MG tablet Take 81 mg by mouth every evening.    [provider]  CALCIUM  MAGNESIUM ZINC PO Take by mouth.    [provider]  cholecalciferol (VITAMIN D ) 25 MCG (1000 UT) tablet Take 1,000 Units by mouth daily.    [provider]  doxycycline  (VIBRA -TABS) 100 MG tablet Take 1 tablet (100 mg total) by mouth 2 (two) times daily. 03/02/24   Isidro Margo, DO  doxycycline  (VIBRAMYCIN ) 100 MG capsule Take 1 capsule (100 mg total) by mouth 2 (two) times daily. 03/23/24     DULoxetine  (CYMBALTA ) 20 MG capsule Take 1 capsule (20 mg total) by mouth daily. 03/02/24   Isidro Margo, DO  estradiol  (ESTRACE ) 0.1 MG/GM vaginal cream INSERT 1/2 APPLICATORFUL VAGINALLY 2 (TWO) TIMES A WEEK. ON SUNDAYS & [facility] 03/30/23 06/25/24  Lavoie, Marie-Lyne, MD  fish oil-omega-3 fatty acids 1000 MG capsule Take 1,000 mg by mouth 2 (two) times daily.     [provider]  folic acid  (FOLVITE ) 1 MG tablet Take 1 tablet (1 mg total) by mouth daily. 10/01/22     folic acid  (FOLVITE ) 1 MG tablet Take 1 tablet (1 mg total) by mouth  daily. 04/07/23     gabapentin  (NEURONTIN ) 400 MG capsule Take 400 mg by mouth at bedtime.    [provider]  gabapentin  (NEURONTIN ) 400 MG capsule Take 1 capsule (400 mg total) by mouth every 8 (eight) hours as needed. 03/02/24     methocarbamol  (ROBAXIN ) 500 MG tablet Take 1 tablet (500 mg total) by mouth every 8 (eight) hours as needed for muscle spasms. 02/16/24   Isidro Margo, DO  metroNIDAZOLE  (METROGEL ) 1 % gel APPLY TOPICALLY TO THE AFFECTED AREA ONCE OR TWICE DAILY AS NEEDED 06/17/21     Multiple Vitamin (MULTIVITAMIN WITH MINERALS) TABS tablet Take 1 tablet by mouth daily.    [provider]  Polyethyl Glycol-Propyl Glycol (SYSTANE OP) Place 1 drop into both eyes 2  (two) times daily as needed (Dry/Irritated eyes).    [provider]  psyllium (METAMUCIL) 58.6 % packet Take 1 packet by mouth daily.    [provider]  rosuvastatin  (CRESTOR ) 20 MG tablet Take 1 tablet (20 mg total) by mouth daily. 11/26/23   Chandrasekhar, Arneta Beverage A, MD  Zoledronic  Acid (RECLAST  IV) Inject 1 Dose into the vein once. Once a year    [provider]      Allergies    Cefaclor, Abaloparatide, Adhesive [tape], Bacitracin-polymyxin b, Fosamax [alendronate sodium], and Neomycin-bacitracin zn-polymyx    Review of Systems   Review of Systems  Cardiovascular:  Positive for chest pain.    Physical Exam Updated Vital Signs BP (!) 181/89   Pulse 66   Temp 98.8 F (37.1 C)   Resp 20   SpO2 97%  Physical Exam Vitals and nursing note reviewed.  Constitutional:      General: She is not in acute distress.    Appearance: She is well-developed.  HENT:     Head: Normocephalic and atraumatic.  Eyes:     Pupils: Pupils are equal, round, and reactive to light.  Cardiovascular:     Rate and Rhythm: Normal rate and regular rhythm.     Heart sounds: Normal heart sounds. No murmur heard.    No friction rub.  Pulmonary:     Effort: Pulmonary effort is normal.     Breath sounds: Normal breath sounds. No wheezing or rales.  Abdominal:     General: Bowel sounds are normal. There is no distension.     Palpations: Abdomen is soft.     Tenderness: There is abdominal tenderness in the epigastric area. There is no guarding or rebound.  Musculoskeletal:        General: No tenderness. Normal range of motion.     Right lower leg: No edema.     Left lower leg: No edema.     Comments: No edema  Skin:    General: Skin is warm and dry.     Findings: No rash.  Neurological:     Mental Status: She is alert and oriented to person, place, and time. Mental status is at baseline.     Cranial Nerves: No cranial nerve deficit.  Psychiatric:        Behavior: Behavior  normal.     ED Results / Procedures / Treatments   Labs (all labs ordered are listed, but only abnormal results are displayed) Labs Reviewed  BASIC METABOLIC PANEL WITH GFR - Abnormal; Notable for the following components:      Result Value   Calcium  10.6 (*)    GFR, Estimated 59 (*)    All other components within normal  limits  LIPASE, BLOOD - Abnormal; Notable for the following components:   Lipase 54 (*)    All other components within normal limits  HEPATIC FUNCTION PANEL - Abnormal; Notable for the following components:   Indirect Bilirubin 0.2 (*)    All other components within normal limits  CBC  TROPONIN T, HIGH SENSITIVITY    EKG EKG Interpretation Date/Time:  Monday Apr 04 2024 18:42:22 EDT Ventricular Rate:  66 PR Interval:  160 QRS Duration:  82 QT Interval:  410 QTC Calculation: 429 R Axis:   87  Text Interpretation: Normal sinus rhythm Right atrial enlargement Borderline ECG When compared with ECG of 06-Nov-2023 08:38, No significant change was found Confirmed by Almond Army ([id number]) on 04/04/2024 7:10:54 PM  Radiology DG Chest 2 View Result Date: 04/04/2024 CLINICAL DATA:  chest pain EXAM: CHEST - 2 VIEW COMPARISON:  None available. FINDINGS: Hyperexpanded lungs with flattening of the diaphragms. Biapical pleural thickening. No focal airspace consolidation, pleural effusion, or pneumothorax. No cardiomegaly. No acute fracture or destructive lesion. Nodular opacities within both upper lung zones are external to the patient IMPRESSION: No acute cardiopulmonary abnormality. Electronically Signed   By: Rance Burrows M.D.   On: 04/04/2024 19:32    Procedures Procedures    Medications Ordered in ED Medications  aspirin  chewable tablet 324 mg (324 mg Oral Given 04/04/24 1948)    ED Course/ Medical Decision Making/ A&P                                 Medical Decision Making Amount and/or Complexity of Data Reviewed Independent Historian: spouse External  Data Reviewed: notes. Labs: ordered. Decision-making details documented in ED Course. Radiology: ordered and independent interpretation performed. Decision-making details documented in ED Course. ECG/medicine tests: ordered and independent interpretation performed. Decision-making details documented in ED Course.  Risk OTC drugs.   Pt with multiple medical problems and comorbidities and presenting today with a complaint that caries a high risk for morbidity and mortality.  Here today with chest discomfort.  Concern for ACS versus GI pathology.  Low suspicion for pneumonia, pneumothorax, dysrhythmia.  Low suspicion for dissection. I independently interpreted patient's EKG and labs.  EKG today without acute findings, troponin is within normal limits, CBC, BMP, LFTs within normal limits, lipase is minimally elevated.  Discussed the findings with the patient.  She has been having pain for the last 8 hours and feel that if this was ACS troponin would be elevated by now.  She is hypertensive today and she reports she has been taking half of her blood pressure medication because the last time she was at her doctor she had not taken it in several days and it was normal and they had told her to cut it down to half but she has not been following it otherwise.  At this time do not feel that she needs further testing in the emergency room however did give her return precautions and ambulatory referral back to her cardiologist to ensure she does not need any further testing.  She and her husband are comfortable with this plan.  They will return if symptoms worsen.        Final Clinical Impression(s) / ED Diagnoses Final diagnoses:  Nonspecific chest pain    Rx / DC Orders ED Discharge Orders          Ordered    Ambulatory referral to Cardiology  04/04/24 2033              Almond Army, MD 04/04/24 2302

## 2024-04-04 NOTE — ED Triage Notes (Signed)
 Episode of feeling clammy, chest tightness, nausea, sweating Started around noon

## 2024-04-04 NOTE — Discharge Instructions (Addendum)
 All the labs today look normal except your lipase is mildly elevated at 54.  Your EKG looked good today.  If your symptoms persist or become worse or you start having severe shortness of breath or passing out you need to return to the hospital immediately.  Otherwise a referral was put out to your cardiologist and someone should contact you it would not be a bad idea to just have things rechecked.  However this could be a stomach related issue as well.

## 2024-04-15 NOTE — Progress Notes (Signed)
 [facility] [address] [place] [address] Phone: [phone] Subjective:   Tracey [name], am serving as a scribe for Dr. Ronnell Coins.  I'm seeing this patient by the request  of:  Merl Star, MD  CC: Back and neck pain follow-up  [id number]  Tracey [name] is a 73 y.o. female coming in with complaint of back and neck pain. OMT on 03/02/2024. Patient states that she is doing well.   Medications patient has been prescribed: doxycycline  and cymbalta   Taking:         Reviewed prior external information including notes and imaging from previsou exam, outside providers and external EMR if available.   As well as notes that were available from care everywhere and other healthcare systems.  Past medical history, social, surgical and family history all reviewed in electronic medical record.  No pertanent information unless stated regarding to the chief complaint.   Past Medical History:  Diagnosis Date   CAD (coronary artery disease)    Closed hip fracture (HCC) 03/2016   RT HIP   Coronary artery disease involving native coronary artery of native heart without angina pectoris 11/06/2023   HSV-1 (herpes simplex virus 1) infection    Hypertension    Osteopenia    Osteoporosis    h/o   Pneumonia    Skin cancer    multiple skin cancers   SOB (shortness of breath) 06/14/2013    Allergies  Allergen Reactions   Cefaclor Hives   Abaloparatide     Other reaction(s): headaches, lethargy, fatigue   Adhesive [Tape]    Bacitracin-Polymyxin B Rash   Fosamax [Alendronate Sodium] Nausea Only    Gi upset   Neomycin-Bacitracin Zn-Polymyx Rash     Review of Systems:  No headache, visual changes, nausea, vomiting, diarrhea, constipation, dizziness, abdominal pain, skin rash, fevers, chills, night sweats, weight loss, swollen lymph nodes, body aches, joint swelling, chest pain, shortness of breath, mood changes. POSITIVE muscle  aches  Objective  Blood pressure 118/78, pulse 69, height 5\' 5"  (1.651 m), weight 104 lb (47.2 kg), SpO2 98%.   General: No apparent distress alert and oriented x3 mood and affect normal, dressed appropriately.  HEENT: Pupils equal, extraocular movements intact  Respiratory: Patient's speak in full sentences and does not appear short of breath  Cardiovascular: No lower extremity edema, non tender, no erythema  Gait MSK:  Back does have some loss lordosis noted.  Some tenderness to palpation in the paraspinal musculature tightness seems to be more around the lumbosacral area today.  Osteopathic findings  C2 flexed rotated and side bent right C7 flexed rotated and side bent left T3 extended rotated and side bent right inhaled rib T9 extended rotated and side bent left L2 flexed rotated and side bent right Sacrum right on right       Assessment and Plan:  No problem-specific Assessment & Plan notes found for this encounter.    Nonallopathic problems  Decision today to treat with OMT was based on Physical Exam  After verbal consent patient was treated with , ME, FPR techniques in cervical, rib, thoracic, lumbar, and sacral  areas avoided HVLA  Patient tolerated the procedure well with improvement in symptoms  Patient given exercises, stretches and lifestyle modifications  See medications in patient instructions if given  Patient will follow up in 4-8 weeks    The above documentation has been reviewed and is accurate and complete Hipolito Martinezlopez M Una Yeomans, DO  Note: This dictation was prepared with Dragon dictation along with smaller phrase technology. Any transcriptional errors that result from this process are unintentional.

## 2024-04-19 ENCOUNTER — Ambulatory Visit: Admitting: Family Medicine

## 2024-04-19 ENCOUNTER — Encounter: Payer: Self-pay | Admitting: Family Medicine

## 2024-04-19 VITALS — BP 118/78 | HR 69 | Ht 65.0 in | Wt 104.0 lb

## 2024-04-19 DIAGNOSIS — M9902 Segmental and somatic dysfunction of thoracic region: Secondary | ICD-10-CM

## 2024-04-19 DIAGNOSIS — M5416 Radiculopathy, lumbar region: Secondary | ICD-10-CM

## 2024-04-19 DIAGNOSIS — M9908 Segmental and somatic dysfunction of rib cage: Secondary | ICD-10-CM

## 2024-04-19 DIAGNOSIS — M9903 Segmental and somatic dysfunction of lumbar region: Secondary | ICD-10-CM | POA: Diagnosis not present

## 2024-04-19 DIAGNOSIS — M9904 Segmental and somatic dysfunction of sacral region: Secondary | ICD-10-CM

## 2024-04-19 DIAGNOSIS — M9901 Segmental and somatic dysfunction of cervical region: Secondary | ICD-10-CM

## 2024-04-19 NOTE — Patient Instructions (Signed)
 Good to see you Keep up the watering See me in 7-8 weeks

## 2024-04-19 NOTE — Assessment & Plan Note (Signed)
 Chronic problem, discussed icing regimen of home exercises, which activities to do to avoid.  Patient is traveling and being very active doing gardening.  Encouraged her to continue some weightbearing exercises.  No change in medications.  Follow-up again in 6 to 8 weeks

## 2024-05-23 ENCOUNTER — Other Ambulatory Visit (HOSPITAL_BASED_OUTPATIENT_CLINIC_OR_DEPARTMENT_OTHER): Payer: Self-pay

## 2024-05-23 ENCOUNTER — Other Ambulatory Visit: Payer: Self-pay | Admitting: Family Medicine

## 2024-05-23 DIAGNOSIS — Z85828 Personal history of other malignant neoplasm of skin: Secondary | ICD-10-CM | POA: Diagnosis not present

## 2024-05-23 DIAGNOSIS — L821 Other seborrheic keratosis: Secondary | ICD-10-CM | POA: Diagnosis not present

## 2024-05-23 DIAGNOSIS — D2261 Melanocytic nevi of right upper limb, including shoulder: Secondary | ICD-10-CM | POA: Diagnosis not present

## 2024-05-23 DIAGNOSIS — D225 Melanocytic nevi of trunk: Secondary | ICD-10-CM | POA: Diagnosis not present

## 2024-05-23 DIAGNOSIS — D2271 Melanocytic nevi of right lower limb, including hip: Secondary | ICD-10-CM | POA: Diagnosis not present

## 2024-05-23 DIAGNOSIS — D692 Other nonthrombocytopenic purpura: Secondary | ICD-10-CM | POA: Diagnosis not present

## 2024-05-23 DIAGNOSIS — D2272 Melanocytic nevi of left lower limb, including hip: Secondary | ICD-10-CM | POA: Diagnosis not present

## 2024-05-23 DIAGNOSIS — Z85820 Personal history of malignant melanoma of skin: Secondary | ICD-10-CM | POA: Diagnosis not present

## 2024-05-23 MED ORDER — AMLODIPINE BESYLATE 5 MG PO TABS
5.0000 mg | ORAL_TABLET | Freq: Every day | ORAL | 1 refills | Status: DC
  Filled 2024-05-23: qty 100, 100d supply, fill #0
  Filled 2024-09-05: qty 100, 100d supply, fill #1

## 2024-05-23 MED ORDER — FOLIC ACID 1 MG PO TABS
1.0000 mg | ORAL_TABLET | Freq: Every day | ORAL | 1 refills | Status: DC
  Filled 2024-05-23: qty 100, 100d supply, fill #0
  Filled 2024-09-05: qty 100, 100d supply, fill #1

## 2024-05-25 ENCOUNTER — Other Ambulatory Visit: Payer: Self-pay

## 2024-05-25 ENCOUNTER — Other Ambulatory Visit (HOSPITAL_BASED_OUTPATIENT_CLINIC_OR_DEPARTMENT_OTHER): Payer: Self-pay

## 2024-05-25 MED ORDER — METHOCARBAMOL 500 MG PO TABS
500.0000 mg | ORAL_TABLET | Freq: Three times a day (TID) | ORAL | 0 refills | Status: DC | PRN
  Filled 2024-05-25: qty 100, 34d supply, fill #0

## 2024-06-06 NOTE — Progress Notes (Unsigned)
 [name] [address] [place] [address] Phone: [phone] Subjective:   ISusannah [name], am serving as a scribe for Dr. Arthea Claudene.  I'm seeing this patient by the request  of:  Rexanne Ingle, MD  CC: Back and neck pain follow-up  YEP:Dlagzrupcz  Tracey [name] is a 73 y.o. female coming in with complaint of back and neck pain. OMT on 03/02/2024. Patient states overall has been doing relatively well.  Has had some tightness after doing a lot [place]  Feels like whenever she does a lot of activity and then goes for a road trip.  Seems to tighten up.  Medications patient has been prescribed: Robaxin  doxycycline  cymbalta   Taking:         Reviewed prior external information including notes and imaging from previsou exam, outside providers and external EMR if available.   As well as notes that were available from care everywhere and other healthcare systems.  Past medical history, social, surgical and family history all reviewed in electronic medical record.  No pertanent information unless stated regarding to the chief complaint.   Past Medical History:  Diagnosis Date   CAD (coronary artery disease)    Closed hip fracture (HCC) 03/2016   RT HIP   Coronary artery disease involving native coronary artery of native heart without angina pectoris 11/06/2023   HSV-1 (herpes simplex virus 1) infection    Hypertension    Osteopenia    Osteoporosis    h/o   Pneumonia    Skin cancer    multiple skin cancers   SOB (shortness of breath) 06/14/2013    Allergies  Allergen Reactions   Cefaclor Hives   Abaloparatide     Other reaction(s): headaches, lethargy, fatigue   Adhesive [Tape]    Bacitracin-Polymyxin B Rash   Fosamax [Alendronate Sodium] Nausea Only    Gi upset   Neomycin-Bacitracin Zn-Polymyx Rash     Review of Systems:  No headache, visual changes, nausea, vomiting, diarrhea, constipation,  dizziness, abdominal pain, skin rash, fevers, chills, night sweats, weight loss, swollen lymph nodes, body aches, joint swelling, chest pain, shortness of breath, mood changes. POSITIVE muscle aches  Objective  Blood pressure 122/68, pulse 69, height 5' 5 (1.651 m), weight 107 lb (48.5 kg), SpO2 98%.   General: No apparent distress alert and oriented x3 mood and affect normal, dressed appropriately.  HEENT: Pupils equal, extraocular movements intact  Respiratory: Patient's speak in full sentences and does not appear short of breath  Cardiovascular: No lower extremity edema, non tender, no erythema  Gait MSK:  Back does have some loss lordosis noted.  Some tenderness to palpation in the paraspinal musculature.  Osteopathic findings  C2 flexed rotated and side bent right C6 flexed rotated and side bent left T3 extended rotated and side bent right inhaled rib T9 extended rotated and side bent left L2 flexed rotated and side bent right Sacrum right on right       Assessment and Plan:  Lumbar radiculopathy Likely no radicular symptoms.  Discussed icing regimen and home exercises, discussed which activities to do and which ones to avoid.  Increase activity slowly.  Discussed icing regimen.  Follow-up again in 6 to 8 weeks continue with more of the muscle energy and FPR techniques if manipulation is needed after further evaluation on next exam.    Nonallopathic problems  Decision today to treat with OMT was based on Physical Exam  After verbal consent patient was treated with  ME, FPR techniques in cervical, rib, thoracic, lumbar, and sacral  areas avoided HVLA  Patient tolerated the procedure well with improvement in symptoms  Patient given exercises, stretches and lifestyle modifications  See medications in patient instructions if given  Patient will follow up in 4-8 weeks     The above documentation has been reviewed and is accurate and complete Tracey Maney M Nihal Marzella,  DO         Note: This dictation was prepared with Dragon dictation along with smaller phrase technology. Any transcriptional errors that result from this process are unintentional.

## 2024-06-07 ENCOUNTER — Other Ambulatory Visit: Payer: Self-pay | Admitting: Family Medicine

## 2024-06-08 ENCOUNTER — Encounter: Payer: Self-pay | Admitting: Family Medicine

## 2024-06-08 ENCOUNTER — Ambulatory Visit: Admitting: Family Medicine

## 2024-06-08 ENCOUNTER — Other Ambulatory Visit (HOSPITAL_BASED_OUTPATIENT_CLINIC_OR_DEPARTMENT_OTHER): Payer: Self-pay

## 2024-06-08 ENCOUNTER — Other Ambulatory Visit: Payer: Self-pay

## 2024-06-08 VITALS — BP 122/68 | HR 69 | Ht 65.0 in | Wt 107.0 lb

## 2024-06-08 DIAGNOSIS — M5416 Radiculopathy, lumbar region: Secondary | ICD-10-CM

## 2024-06-08 DIAGNOSIS — M9908 Segmental and somatic dysfunction of rib cage: Secondary | ICD-10-CM

## 2024-06-08 DIAGNOSIS — M9902 Segmental and somatic dysfunction of thoracic region: Secondary | ICD-10-CM

## 2024-06-08 DIAGNOSIS — M9901 Segmental and somatic dysfunction of cervical region: Secondary | ICD-10-CM

## 2024-06-08 DIAGNOSIS — M9903 Segmental and somatic dysfunction of lumbar region: Secondary | ICD-10-CM | POA: Diagnosis not present

## 2024-06-08 DIAGNOSIS — M9904 Segmental and somatic dysfunction of sacral region: Secondary | ICD-10-CM | POA: Diagnosis not present

## 2024-06-08 MED ORDER — DULOXETINE HCL 20 MG PO CPEP
20.0000 mg | ORAL_CAPSULE | Freq: Every day | ORAL | 0 refills | Status: DC
  Filled 2024-06-08: qty 100, 100d supply, fill #0

## 2024-06-08 NOTE — Patient Instructions (Signed)
 Great to see you Enjoy [place] Stretch after flexed See me in 7-8 weeks

## 2024-06-08 NOTE — Assessment & Plan Note (Addendum)
 Likely no radicular symptoms.  Discussed icing regimen and home exercises, discussed which activities to do and which ones to avoid.  Increase activity slowly.  Discussed icing regimen.  Follow-up again in 6 to 8 weeks continue with more of the muscle energy and FPR techniques if manipulation is needed after further evaluation on next exam.

## 2024-06-15 ENCOUNTER — Ambulatory Visit: Admitting: Internal Medicine

## 2024-06-24 ENCOUNTER — Ambulatory Visit: Admitting: Internal Medicine

## 2024-06-24 ENCOUNTER — Other Ambulatory Visit (HOSPITAL_BASED_OUTPATIENT_CLINIC_OR_DEPARTMENT_OTHER): Payer: Self-pay

## 2024-06-24 DIAGNOSIS — Z681 Body mass index (BMI) 19 or less, adult: Secondary | ICD-10-CM | POA: Diagnosis not present

## 2024-06-24 DIAGNOSIS — R32 Unspecified urinary incontinence: Secondary | ICD-10-CM | POA: Diagnosis not present

## 2024-06-24 DIAGNOSIS — Z1151 Encounter for screening for human papillomavirus (HPV): Secondary | ICD-10-CM | POA: Diagnosis not present

## 2024-06-24 DIAGNOSIS — Z124 Encounter for screening for malignant neoplasm of cervix: Secondary | ICD-10-CM | POA: Diagnosis not present

## 2024-06-24 MED ORDER — GEMTESA 75 MG PO TABS
75.0000 mg | ORAL_TABLET | Freq: Every day | ORAL | 11 refills | Status: AC
  Filled 2024-06-24 – 2024-07-26 (×2): qty 30, 30d supply, fill #0

## 2024-06-24 MED ORDER — ESTRADIOL 0.1 MG/GM VA CREA
TOPICAL_CREAM | VAGINAL | 6 refills | Status: DC
  Filled 2024-06-24: qty 42.5, 90d supply, fill #0

## 2024-07-25 NOTE — Progress Notes (Unsigned)
  [facility] [address] [place] [address] Phone: [phone] Subjective:   ISusannah [name], am serving as a scribe for Dr. Arthea Claudene.  I'm seeing this patient by the request  of:  Rexanne Ingle, MD  CC: Back and neck pain follow-up  YEP:Dlagzrupcz  Tracey [name] is a 73 y.o. female coming in with complaint of back and neck pain. OMT on 06/08/2024. Patient states same per usual. No new concerns.  Medications patient has been prescribed: Cymbalta   Taking:        .   Past Medical History:  Diagnosis Date   CAD (coronary artery disease)    Closed hip fracture (HCC) 03/2016   RT HIP   Coronary artery disease involving native coronary artery of native heart without angina pectoris 11/06/2023   HSV-1 (herpes simplex virus 1) infection    Hypertension    Osteopenia    Osteoporosis    h/o   Pneumonia    Skin cancer    multiple skin cancers   SOB (shortness of breath) 06/14/2013    Allergies  Allergen Reactions   Cefaclor Hives   Abaloparatide     Other reaction(s): headaches, lethargy, fatigue   Adhesive [Tape]    Bacitracin-Polymyxin B Rash   Fosamax [Alendronate Sodium] Nausea Only    Gi upset   Neomycin-Bacitracin Zn-Polymyx Rash   Review of Systems:  No headache, visual changes, nausea, vomiting, diarrhea, constipation, dizziness, abdominal pain, skin rash, fevers, chills, night sweats, weight loss, swollen lymph nodes, body aches, joint swelling, chest pain, shortness of breath, mood changes. POSITIVE muscle aches  Objective  Blood pressure 126/74, pulse 62, height 5' 5 (1.651 m), weight 108 lb (49 kg), SpO2 98%.   General: No apparent distress alert and oriented x3 mood and affect normal, dressed appropriately.  HEENT: Pupils equal, extraocular movements intact  Respiratory: Patient's speak in full sentences and does not appear short of breath  Cardiovascular: No lower extremity edema, non tender, no erythema  Gait  relatively normal MSK:  Back does have some mild loss of lordosis, some degenerative scoliosis noted.  Some tightness noted in paraspinal musculature.  Osteopathic findings  C2 flexed rotated and side bent right C7 flexed rotated and side bent left T3 extended rotated and side bent right inhaled rib T11 extended rotated and side bent left L3 flexed rotated and side bent right Sacrum right on right    Assessment and Plan:  Sacroiliac dysfunction Discussed icing regimen and home exercises, which activities to do and which ones to avoid.  Patient has been doing a lot of kayaking and encouraged her to continue to do so.  Increase activity slowly.  Follow-up again in 6 to 8 weeks.    Nonallopathic problems  Decision today to treat with OMT was based on Physical Exam  After verbal consent patient was treated with  ME, FPR techniques in cervical, rib, thoracic, lumbar, and sacral  areas  Patient tolerated the procedure well with improvement in symptoms  Patient given exercises, stretches and lifestyle modifications  See medications in patient instructions if given  Patient will follow up in 4-8 weeks    The above documentation has been reviewed and is accurate and complete Wilhelmina Hark M Stuart Mirabile, DO          Note: This dictation was prepared with Dragon dictation along with smaller phrase technology. Any transcriptional errors that result from this process are unintentional.

## 2024-07-27 ENCOUNTER — Other Ambulatory Visit (HOSPITAL_BASED_OUTPATIENT_CLINIC_OR_DEPARTMENT_OTHER): Payer: Self-pay

## 2024-07-28 ENCOUNTER — Encounter: Payer: Self-pay | Admitting: Family Medicine

## 2024-07-28 ENCOUNTER — Ambulatory Visit: Admitting: Family Medicine

## 2024-07-28 VITALS — BP 126/74 | HR 62 | Ht 65.0 in | Wt 108.0 lb

## 2024-07-28 DIAGNOSIS — M9901 Segmental and somatic dysfunction of cervical region: Secondary | ICD-10-CM

## 2024-07-28 DIAGNOSIS — M810 Age-related osteoporosis without current pathological fracture: Secondary | ICD-10-CM | POA: Diagnosis not present

## 2024-07-28 DIAGNOSIS — M9902 Segmental and somatic dysfunction of thoracic region: Secondary | ICD-10-CM | POA: Diagnosis not present

## 2024-07-28 DIAGNOSIS — M9904 Segmental and somatic dysfunction of sacral region: Secondary | ICD-10-CM | POA: Diagnosis not present

## 2024-07-28 DIAGNOSIS — M533 Sacrococcygeal disorders, not elsewhere classified: Secondary | ICD-10-CM

## 2024-07-28 DIAGNOSIS — M9903 Segmental and somatic dysfunction of lumbar region: Secondary | ICD-10-CM

## 2024-07-28 DIAGNOSIS — M9908 Segmental and somatic dysfunction of rib cage: Secondary | ICD-10-CM | POA: Diagnosis not present

## 2024-07-28 NOTE — Patient Instructions (Signed)
 Enjoy kayaking and golf balls See you again in 6-8 weeks Good to see you!

## 2024-07-28 NOTE — Assessment & Plan Note (Signed)
 Discussed icing regimen and home exercises, which activities to do and which ones to avoid.  Patient has been doing a lot of kayaking and encouraged her to continue to do so.  Increase activity slowly.  Follow-up again in 6 to 8 weeks.

## 2024-08-23 DIAGNOSIS — L821 Other seborrheic keratosis: Secondary | ICD-10-CM | POA: Diagnosis not present

## 2024-08-23 DIAGNOSIS — D2271 Melanocytic nevi of right lower limb, including hip: Secondary | ICD-10-CM | POA: Diagnosis not present

## 2024-08-23 DIAGNOSIS — D692 Other nonthrombocytopenic purpura: Secondary | ICD-10-CM | POA: Diagnosis not present

## 2024-08-23 DIAGNOSIS — L905 Scar conditions and fibrosis of skin: Secondary | ICD-10-CM | POA: Diagnosis not present

## 2024-08-23 DIAGNOSIS — D1801 Hemangioma of skin and subcutaneous tissue: Secondary | ICD-10-CM | POA: Diagnosis not present

## 2024-08-23 DIAGNOSIS — L570 Actinic keratosis: Secondary | ICD-10-CM | POA: Diagnosis not present

## 2024-08-23 DIAGNOSIS — D485 Neoplasm of uncertain behavior of skin: Secondary | ICD-10-CM | POA: Diagnosis not present

## 2024-08-23 DIAGNOSIS — Z85820 Personal history of malignant melanoma of skin: Secondary | ICD-10-CM | POA: Diagnosis not present

## 2024-08-23 DIAGNOSIS — Z85828 Personal history of other malignant neoplasm of skin: Secondary | ICD-10-CM | POA: Diagnosis not present

## 2024-09-05 ENCOUNTER — Other Ambulatory Visit: Payer: Self-pay | Admitting: Family Medicine

## 2024-09-05 DIAGNOSIS — M810 Age-related osteoporosis without current pathological fracture: Secondary | ICD-10-CM | POA: Diagnosis not present

## 2024-09-05 DIAGNOSIS — Z0000 Encounter for general adult medical examination without abnormal findings: Secondary | ICD-10-CM | POA: Diagnosis not present

## 2024-09-05 DIAGNOSIS — E7800 Pure hypercholesterolemia, unspecified: Secondary | ICD-10-CM | POA: Diagnosis not present

## 2024-09-05 DIAGNOSIS — N1831 Chronic kidney disease, stage 3a: Secondary | ICD-10-CM | POA: Diagnosis not present

## 2024-09-05 DIAGNOSIS — I10 Essential (primary) hypertension: Secondary | ICD-10-CM | POA: Diagnosis not present

## 2024-09-05 DIAGNOSIS — Z1331 Encounter for screening for depression: Secondary | ICD-10-CM | POA: Diagnosis not present

## 2024-09-05 DIAGNOSIS — R109 Unspecified abdominal pain: Secondary | ICD-10-CM | POA: Diagnosis not present

## 2024-09-05 DIAGNOSIS — Z5181 Encounter for therapeutic drug level monitoring: Secondary | ICD-10-CM | POA: Diagnosis not present

## 2024-09-06 ENCOUNTER — Other Ambulatory Visit (HOSPITAL_BASED_OUTPATIENT_CLINIC_OR_DEPARTMENT_OTHER): Payer: Self-pay

## 2024-09-06 ENCOUNTER — Other Ambulatory Visit (HOSPITAL_BASED_OUTPATIENT_CLINIC_OR_DEPARTMENT_OTHER): Payer: Self-pay | Admitting: Internal Medicine

## 2024-09-06 ENCOUNTER — Other Ambulatory Visit: Payer: Self-pay

## 2024-09-06 DIAGNOSIS — M810 Age-related osteoporosis without current pathological fracture: Secondary | ICD-10-CM

## 2024-09-06 MED ORDER — METHOCARBAMOL 500 MG PO TABS
500.0000 mg | ORAL_TABLET | Freq: Three times a day (TID) | ORAL | 0 refills | Status: DC | PRN
  Filled 2024-09-06: qty 100, 34d supply, fill #0

## 2024-09-06 NOTE — Progress Notes (Unsigned)
 [name] [address] [place] [address] Phone: [phone] Subjective:   Tracey [name] am a scribe for Dr. Claudene.   I'm seeing this patient by the request  of:  Tracey Ingle, MD  CC: back and neck pain follow up   YEP:Dlagzrupcz  Tracey [name] is a 73 y.o. female coming in with complaint of back and neck pain. OMT on 07/28/2024. Patient states back and neck are terrible today.  Patient is significantly tighter than what she has been doing but did have a reason with all the yard work that she has done recently.  Medications patient has been prescribed: Robaxin   Taking:         Reviewed prior external information including notes and imaging from previsou exam, outside providers and external EMR if available.   As well as notes that were available from care everywhere and other healthcare systems.  Past medical history, social, surgical and family history all reviewed in electronic medical record.  No pertanent information unless stated regarding to the chief complaint.   Past Medical History:  Diagnosis Date   CAD (coronary artery disease)    Closed hip fracture (HCC) 03/2016   RT HIP   Coronary artery disease involving native coronary artery of native heart without angina pectoris 11/06/2023   HSV-1 (herpes simplex virus 1) infection    Hypertension    Osteopenia    Osteoporosis    h/o   Pneumonia    Skin cancer    multiple skin cancers   SOB (shortness of breath) 06/14/2013    Allergies  Allergen Reactions   Cefaclor Hives   Abaloparatide     Other reaction(s): headaches, lethargy, fatigue   Adhesive [Tape]    Bacitracin-Polymyxin B Rash   Fosamax [Alendronate Sodium] Nausea Only    Gi upset   Neomycin-Bacitracin Zn-Polymyx Rash     Review of Systems:  No headache, visual changes, nausea, vomiting, diarrhea, constipation, dizziness, abdominal pain, skin rash, fevers, chills, night sweats, weight loss, swollen  lymph nodes, body aches, joint swelling, chest pain, shortness of breath, mood changes. POSITIVE muscle aches  Objective  Blood pressure 130/70, pulse 74, height 5' 5 (1.651 m), weight 108 lb (49 kg), SpO2 97%.   General: No apparent distress alert and oriented x3 mood and affect normal, dressed appropriately.  HEENT: Pupils equal, extraocular movements intact  Respiratory: Patient's speak in full sentences and does not appear short of breath  Cardiovascular: No lower extremity edema, non tender, no erythema  Low back does have some loss of lordosis noted.  Some tenderness to palpation in the paraspinal musculature.  Patient's back significantly tighter than usual.  More discomfort over the right sacroiliac joint and the right buttocks area.   Osteopathic findings Cervical C2 flexed rotated and side bent right C4 flexed rotated and side bent left C6 flexed rotated and side bent left T3 extended rotated and side bent right inhaled third rib T9 extended rotated and side bent left L2 flexed rotated and side bent right L5 flexed rotated and side bent right Sacrum right on right      Assessment and Plan:  Lumbar radiculopathy Tightness of the lower back that is likely contributing to the discomfort and pain and some radicular symptoms.  Discussed icing regimen and home exercises, which activities to do and which ones to avoid.  Discussed with patient about icing regimen.  Attempted osteopathic manipulation after further evaluation with muscle energy techniques.  Did not  get good amount of motion.  Follow-up again in 6 to 8 weeks.  Worsening pain seek medical attention immediately.      Decision today to treat with OMT was based on Physical Exam  After verbal consent patient was treated with , ME, FPR techniques in cervical, thoracic, rib, lumbar and sacral areas, all areas are chronic   Patient tolerated the procedure well with improvement in symptoms  Patient given exercises,  stretches and lifestyle modifications  See medications in patient instructions if given  Patient will follow up in 4-8 weeks   The above documentation has been reviewed and is accurate and complete Tracey [name] M Anny Sayler, DO          Note: This dictation was prepared with Dragon dictation along with smaller phrase technology. Any transcriptional errors that result from this process are unintentional.

## 2024-09-07 ENCOUNTER — Ambulatory Visit: Admitting: Family Medicine

## 2024-09-07 ENCOUNTER — Encounter: Payer: Self-pay | Admitting: Family Medicine

## 2024-09-07 VITALS — BP 130/70 | HR 74 | Ht 65.0 in | Wt 108.0 lb

## 2024-09-07 DIAGNOSIS — M5416 Radiculopathy, lumbar region: Secondary | ICD-10-CM | POA: Diagnosis not present

## 2024-09-07 DIAGNOSIS — M9903 Segmental and somatic dysfunction of lumbar region: Secondary | ICD-10-CM

## 2024-09-07 DIAGNOSIS — M9908 Segmental and somatic dysfunction of rib cage: Secondary | ICD-10-CM

## 2024-09-07 DIAGNOSIS — M9901 Segmental and somatic dysfunction of cervical region: Secondary | ICD-10-CM | POA: Diagnosis not present

## 2024-09-07 DIAGNOSIS — M9902 Segmental and somatic dysfunction of thoracic region: Secondary | ICD-10-CM

## 2024-09-07 DIAGNOSIS — M9904 Segmental and somatic dysfunction of sacral region: Secondary | ICD-10-CM | POA: Diagnosis not present

## 2024-09-07 NOTE — Assessment & Plan Note (Signed)
 Tightness of the lower back that is likely contributing to the discomfort and pain and some radicular symptoms.  Discussed icing regimen and home exercises, which activities to do and which ones to avoid.  Discussed with patient about icing regimen.  Attempted osteopathic manipulation after further evaluation with muscle energy techniques.  Did not get good amount of motion.  Follow-up again in 6 to 8 weeks.  Worsening pain seek medical attention immediately.

## 2024-09-07 NOTE — Patient Instructions (Signed)
 Congrats cutting down those trees See you again in 2 months

## 2024-09-20 ENCOUNTER — Other Ambulatory Visit: Payer: Self-pay | Admitting: Family Medicine

## 2024-09-21 ENCOUNTER — Other Ambulatory Visit: Payer: Self-pay

## 2024-09-21 ENCOUNTER — Other Ambulatory Visit (HOSPITAL_BASED_OUTPATIENT_CLINIC_OR_DEPARTMENT_OTHER): Payer: Self-pay

## 2024-09-21 MED ORDER — DULOXETINE HCL 20 MG PO CPEP
20.0000 mg | ORAL_CAPSULE | Freq: Every day | ORAL | 0 refills | Status: DC
  Filled 2024-09-21: qty 100, 100d supply, fill #0

## 2024-09-21 MED ORDER — ESTRADIOL 0.01 % VA CREA
0.5000 g | TOPICAL_CREAM | VAGINAL | 5 refills | Status: AC
  Filled 2024-09-21: qty 42.5, 90d supply, fill #0
  Filled 2024-12-17: qty 42.5, 90d supply, fill #1

## 2024-10-24 NOTE — Progress Notes (Signed)
 [facility] [address] [place] [address] Phone: [phone] Subjective:   Tracey [name], am serving as a scribe for Dr. Arthea Claudene.  I'm seeing this patient by the request  of:  Rexanne Ingle, MD  CC: Low back pain , Neck pain follow-up  YEP:Dlagzrupcz  Tracey [name] is a 73 y.o. female coming in with complaint of back and neck pain. OMT on 09/07/2024. Patient states same per usual. No new symptoms.  Medications patient has been prescribed: Robaxin  Cymbalta   Taking:         Reviewed prior external information including notes and imaging from previsou exam, outside providers and external EMR if available.   As well as notes that were available from care everywhere and other healthcare systems.  Past medical history, social, surgical and family history all reviewed in electronic medical record.  No pertanent information unless stated regarding to the chief complaint.   Past Medical History:  Diagnosis Date   CAD (coronary artery disease)    Closed hip fracture (HCC) 03/2016   RT HIP   Coronary artery disease involving native coronary artery of native heart without angina pectoris 11/06/2023   HSV-1 (herpes simplex virus 1) infection    Hypertension    Osteopenia    Osteoporosis    h/o   Pneumonia    Skin cancer    multiple skin cancers   SOB (shortness of breath) 06/14/2013    Allergies  Allergen Reactions   Cefaclor Hives   Abaloparatide     Other reaction(s): headaches, lethargy, fatigue   Adhesive [Tape]    Bacitracin-Polymyxin B Rash   Fosamax [Alendronate Sodium] Nausea Only    Gi upset   Neomycin-Bacitracin Zn-Polymyx Rash     Review of Systems:  No headache, visual changes, nausea, vomiting, diarrhea, constipation, dizziness, abdominal pain, skin rash, fevers, chills, night sweats, weight loss, swollen lymph nodes, body aches, joint swelling, chest pain, shortness of breath, mood changes. POSITIVE muscle  aches  Objective  Blood pressure 132/84, pulse 79, height 5' 5 (1.651 m), weight 112 lb (50.8 kg), SpO2 99%.   General: No apparent distress alert and oriented x3 mood and affect normal, dressed appropriately.  HEENT: Pupils equal, extraocular movements intact  Respiratory: Patient's speak in full sentences and does not appear short of breath  Cardiovascular: No lower extremity edema, non tender, no erythema  Gait relatively normal MSK:  Back does have some loss of lordosis noted.  Some tenderness to palpation in the paraspinal musculature.  Tightness with FABER right greater than left.  Osteopathic findings  C6 flexed rotated and side bent left T3 extended rotated and side bent right inhaled rib T9 extended rotated and side bent left L2 flexed rotated and side bent right L3 flexed rotated and side bent left Sacrum right on right     Assessment and Plan:  Lumbar radiculopathy Chronic problem and stable at the moment.  Does have arthritic changes and osteopenia and osteoporosis of multiple different areas.  Continuing to be active and continue the medications.  Discussed posture and ergonomics.  Discussed some strengthening and will continue to work on this.  Follow-up again in 6 to 8 weeks    Nonallopathic problems  Decision today to treat with OMT was based on Physical Exam  After verbal consent patient was treated with  ME, FPR techniques in cervical, rib, thoracic, lumbar, and sacral  areas avoided any HVLA on patient.  Patient tolerated the procedure well with  improvement in symptoms  Patient given exercises, stretches and lifestyle modifications  See medications in patient instructions if given  Patient will follow up in 4-8 weeks     The above documentation has been reviewed and is accurate and complete Alex Leahy M Neri Samek, DO         Note: This dictation was prepared with Dragon dictation along with smaller phrase technology. Any transcriptional errors that  result from this process are unintentional.

## 2024-11-01 ENCOUNTER — Ambulatory Visit: Admitting: Family Medicine

## 2024-11-01 ENCOUNTER — Encounter: Payer: Self-pay | Admitting: Family Medicine

## 2024-11-01 VITALS — BP 132/84 | HR 79 | Ht 65.0 in | Wt 112.0 lb

## 2024-11-01 DIAGNOSIS — M9901 Segmental and somatic dysfunction of cervical region: Secondary | ICD-10-CM

## 2024-11-01 DIAGNOSIS — M9903 Segmental and somatic dysfunction of lumbar region: Secondary | ICD-10-CM

## 2024-11-01 DIAGNOSIS — M5416 Radiculopathy, lumbar region: Secondary | ICD-10-CM

## 2024-11-01 DIAGNOSIS — M9908 Segmental and somatic dysfunction of rib cage: Secondary | ICD-10-CM

## 2024-11-01 DIAGNOSIS — M9902 Segmental and somatic dysfunction of thoracic region: Secondary | ICD-10-CM

## 2024-11-01 DIAGNOSIS — M9904 Segmental and somatic dysfunction of sacral region: Secondary | ICD-10-CM

## 2024-11-01 NOTE — Assessment & Plan Note (Signed)
 Chronic problem and stable at the moment.  Does have arthritic changes and osteopenia and osteoporosis of multiple different areas.  Continuing to be active and continue the medications.  Discussed posture and ergonomics.  Discussed some strengthening and will continue to work on this.  Follow-up again in 6 to 8 weeks

## 2024-11-01 NOTE — Patient Instructions (Addendum)
 Good to see you! Happy Holidays No more getting on your roof See you again in 6-8 weeks

## 2024-11-02 DIAGNOSIS — Z85828 Personal history of other malignant neoplasm of skin: Secondary | ICD-10-CM | POA: Diagnosis not present

## 2024-11-02 DIAGNOSIS — Z85820 Personal history of malignant melanoma of skin: Secondary | ICD-10-CM | POA: Diagnosis not present

## 2024-11-02 DIAGNOSIS — C44529 Squamous cell carcinoma of skin of other part of trunk: Secondary | ICD-10-CM | POA: Diagnosis not present

## 2024-12-17 ENCOUNTER — Other Ambulatory Visit: Payer: Self-pay | Admitting: Family Medicine

## 2024-12-17 ENCOUNTER — Other Ambulatory Visit (HOSPITAL_BASED_OUTPATIENT_CLINIC_OR_DEPARTMENT_OTHER): Payer: Self-pay

## 2024-12-18 ENCOUNTER — Other Ambulatory Visit: Payer: Self-pay

## 2024-12-20 ENCOUNTER — Other Ambulatory Visit (HOSPITAL_BASED_OUTPATIENT_CLINIC_OR_DEPARTMENT_OTHER): Payer: Self-pay

## 2024-12-20 MED ORDER — METHOCARBAMOL 500 MG PO TABS
500.0000 mg | ORAL_TABLET | Freq: Three times a day (TID) | ORAL | 0 refills | Status: AC | PRN
  Filled 2024-12-20: qty 100, 34d supply, fill #0

## 2024-12-20 MED ORDER — FOLIC ACID 1 MG PO TABS
1.0000 mg | ORAL_TABLET | Freq: Every day | ORAL | 3 refills | Status: AC
  Filled 2024-12-20: qty 100, 100d supply, fill #0

## 2024-12-20 MED ORDER — AMLODIPINE BESYLATE 5 MG PO TABS
5.0000 mg | ORAL_TABLET | Freq: Every day | ORAL | 3 refills | Status: AC
  Filled 2024-12-20: qty 100, 100d supply, fill #0

## 2024-12-21 NOTE — Progress Notes (Unsigned)
 " [facility] [address] [place] [address] Phone: [phone] Subjective:   Tracey [name], am serving as a scribe for Dr. Arthea Claudene.  I'm seeing this patient by the request  of:  Rexanne Ingle, MD  CC: Back and neck pain follow-up  YEP:Dlagzrupcz  Tracey [name] is a 74 y.o. female coming in with complaint of back and neck pain. OMT on 11/01/2024. Patient states same per usual. No new symptoms.  Patient has been a little more active than usual with taking care of her husband.  Medications patient has been prescribed: Robaxin   Taking:      Reviewed prior external information including notes and imaging from previsou exam, outside providers and external EMR if available.   As well as notes that were available from care everywhere and other healthcare systems.  Past medical history, social, surgical and family history all reviewed in electronic medical record.  No pertanent information unless stated regarding to the chief complaint.   Past Medical History:  Diagnosis Date   CAD (coronary artery disease)    Closed hip fracture (HCC) 03/2016   RT HIP   Coronary artery disease involving native coronary artery of native heart without angina pectoris 11/06/2023   HSV-1 (herpes simplex virus 1) infection    Hypertension    Osteopenia    Osteoporosis    h/o   Pneumonia    Skin cancer    multiple skin cancers   SOB (shortness of breath) 06/14/2013    Allergies[1]   Review of Systems:  No headache, visual changes, nausea, vomiting, diarrhea, constipation, dizziness, abdominal pain, skin rash, fevers, chills, night sweats, weight loss, swollen lymph nodes, body aches, joint swelling, chest pain, shortness of breath, mood changes. POSITIVE muscle aches  Objective  Blood pressure 136/72, pulse 92, height 5' 5 (1.651 m), weight 109 lb (49.4 kg), SpO2 98%.   General: No apparent distress alert and oriented x3 mood and affect normal,  dressed appropriately.  HEENT: Pupils equal, extraocular movements intact  Respiratory: Patient's speak in full sentences and does not appear short of breath  Cardiovascular: No lower extremity edema, non tender, no erythema  Gait relatively normal MSK:  Back does have some loss of lordosis noted.  Some tenderness to palpation in the paraspinal musculature.  Tightness with FABER right greater than left. Lacks the last 5 degrees of extension.  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  Patient now is finally being able to start taking care of herself on a more regular basis.  Discussed which activities to do and which ones to avoid.  Increase activity slowly.  Discussed icing regimen.  Follow-up again in 6 to 12 weeks    Nonallopathic problems  Decision today to treat with OMT was based on Physical Exam  After verbal consent patient was treated with  ME, FPR techniques in cervical, rib, thoracic, lumbar, and sacral  areas  Patient tolerated the procedure well with improvement in symptoms  Patient given exercises, stretches and lifestyle modifications  See medications in patient  instructions if given  Patient will follow up in 4-8 weeks    The above documentation has been reviewed and is accurate and complete Tracey Rougeau M Shaughnessy Gethers, DO          Note: This dictation was prepared with Dragon dictation along with smaller phrase technology. Any transcriptional errors that result from this process are unintentional.            [1]  Allergies Allergen  Reactions   Cefaclor Hives   Abaloparatide     Other reaction(s): headaches, lethargy, fatigue   Adhesive [Tape]    Bacitracin-Polymyxin B Rash   Fosamax [Alendronate Sodium] Nausea Only    Gi upset   Neomycin-Bacitracin Zn-Polymyx Rash   "

## 2024-12-22 ENCOUNTER — Encounter: Payer: Self-pay | Admitting: Family Medicine

## 2024-12-22 ENCOUNTER — Ambulatory Visit: Admitting: Family Medicine

## 2024-12-22 VITALS — BP 136/72 | HR 92 | Ht 65.0 in | Wt 109.0 lb

## 2024-12-22 DIAGNOSIS — M9901 Segmental and somatic dysfunction of cervical region: Secondary | ICD-10-CM | POA: Diagnosis not present

## 2024-12-22 DIAGNOSIS — M9904 Segmental and somatic dysfunction of sacral region: Secondary | ICD-10-CM

## 2024-12-22 DIAGNOSIS — M9903 Segmental and somatic dysfunction of lumbar region: Secondary | ICD-10-CM

## 2024-12-22 DIAGNOSIS — M9902 Segmental and somatic dysfunction of thoracic region: Secondary | ICD-10-CM

## 2024-12-22 DIAGNOSIS — M9908 Segmental and somatic dysfunction of rib cage: Secondary | ICD-10-CM | POA: Diagnosis not present

## 2024-12-22 DIAGNOSIS — M5416 Radiculopathy, lumbar region: Secondary | ICD-10-CM

## 2024-12-22 NOTE — Patient Instructions (Signed)
 See me again in 6 weeks Think about aquatic PT for hubby

## 2024-12-22 NOTE — Assessment & Plan Note (Signed)
 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  Patient now is finally being able to start taking care of herself on a more regular basis.  Discussed which activities to do and which ones to avoid.  Increase activity slowly.  Discussed icing regimen.  Follow-up again in 6 to 12 weeks

## 2024-12-27 ENCOUNTER — Other Ambulatory Visit: Payer: Self-pay | Admitting: Family Medicine

## 2024-12-27 ENCOUNTER — Other Ambulatory Visit: Payer: Self-pay

## 2024-12-27 ENCOUNTER — Other Ambulatory Visit (HOSPITAL_BASED_OUTPATIENT_CLINIC_OR_DEPARTMENT_OTHER): Payer: Self-pay

## 2024-12-27 ENCOUNTER — Other Ambulatory Visit: Payer: Self-pay | Admitting: Internal Medicine

## 2024-12-27 MED ORDER — DULOXETINE HCL 20 MG PO CPEP
20.0000 mg | ORAL_CAPSULE | Freq: Every day | ORAL | 0 refills | Status: AC
  Filled 2024-12-27: qty 100, 100d supply, fill #0

## 2024-12-27 MED ORDER — ROSUVASTATIN CALCIUM 20 MG PO TABS
20.0000 mg | ORAL_TABLET | Freq: Every day | ORAL | 0 refills | Status: AC
  Filled 2024-12-27: qty 90, 90d supply, fill #0

## 2025-02-02 ENCOUNTER — Ambulatory Visit: Admitting: Family Medicine
